# Patient Record
Sex: Male | Born: 1960 | Race: Black or African American | Hispanic: No | Marital: Single | Smoking: Former smoker
Health system: Southern US, Community
[De-identification: ages and names within clinical notes are randomized; demographics above are authoritative.]

## PROBLEM LIST (undated history)

## (undated) DIAGNOSIS — N289 Disorder of kidney and ureter, unspecified: Secondary | ICD-10-CM

## (undated) DIAGNOSIS — E7800 Pure hypercholesterolemia, unspecified: Secondary | ICD-10-CM

## (undated) DIAGNOSIS — I82409 Acute embolism and thrombosis of unspecified deep veins of unspecified lower extremity: Secondary | ICD-10-CM

## (undated) DIAGNOSIS — I509 Heart failure, unspecified: Secondary | ICD-10-CM

## (undated) DIAGNOSIS — I10 Essential (primary) hypertension: Secondary | ICD-10-CM

---

## 1998-07-05 ENCOUNTER — Ambulatory Visit (HOSPITAL_BASED_OUTPATIENT_CLINIC_OR_DEPARTMENT_OTHER): Admission: RE | Admit: 1998-07-05 | Discharge: 1998-07-05 | Payer: Self-pay | Admitting: *Deleted

## 2002-09-14 ENCOUNTER — Emergency Department (HOSPITAL_COMMUNITY): Admission: EM | Admit: 2002-09-14 | Discharge: 2002-09-14 | Payer: Self-pay | Admitting: Emergency Medicine

## 2002-10-27 ENCOUNTER — Ambulatory Visit (HOSPITAL_COMMUNITY): Admission: RE | Admit: 2002-10-27 | Discharge: 2002-10-27 | Payer: Self-pay | Admitting: Gastroenterology

## 2003-12-08 ENCOUNTER — Ambulatory Visit (HOSPITAL_COMMUNITY): Admission: RE | Admit: 2003-12-08 | Discharge: 2003-12-08 | Payer: Self-pay | Admitting: Internal Medicine

## 2005-04-07 ENCOUNTER — Emergency Department (HOSPITAL_COMMUNITY): Admission: EM | Admit: 2005-04-07 | Discharge: 2005-04-07 | Payer: Self-pay | Admitting: Emergency Medicine

## 2005-06-21 ENCOUNTER — Emergency Department (HOSPITAL_COMMUNITY): Admission: EM | Admit: 2005-06-21 | Discharge: 2005-06-21 | Payer: Self-pay | Admitting: Emergency Medicine

## 2005-09-04 ENCOUNTER — Ambulatory Visit: Payer: Self-pay | Admitting: Family Medicine

## 2005-09-09 ENCOUNTER — Ambulatory Visit: Payer: Self-pay | Admitting: Family Medicine

## 2005-09-24 ENCOUNTER — Ambulatory Visit: Payer: Self-pay | Admitting: Internal Medicine

## 2005-12-11 ENCOUNTER — Ambulatory Visit: Payer: Self-pay | Admitting: Family Medicine

## 2005-12-17 ENCOUNTER — Ambulatory Visit: Payer: Self-pay | Admitting: Family Medicine

## 2005-12-24 ENCOUNTER — Ambulatory Visit: Payer: Self-pay | Admitting: Family Medicine

## 2006-07-06 ENCOUNTER — Ambulatory Visit: Payer: Self-pay | Admitting: Family Medicine

## 2006-10-06 ENCOUNTER — Emergency Department (HOSPITAL_COMMUNITY): Admission: EM | Admit: 2006-10-06 | Discharge: 2006-10-06 | Payer: Self-pay | Admitting: Emergency Medicine

## 2006-10-16 ENCOUNTER — Ambulatory Visit: Payer: Self-pay | Admitting: Family Medicine

## 2006-11-15 ENCOUNTER — Emergency Department (HOSPITAL_COMMUNITY): Admission: EM | Admit: 2006-11-15 | Discharge: 2006-11-15 | Payer: Self-pay | Admitting: Emergency Medicine

## 2006-11-16 ENCOUNTER — Emergency Department (HOSPITAL_COMMUNITY): Admission: EM | Admit: 2006-11-16 | Discharge: 2006-11-16 | Payer: Self-pay | Admitting: Emergency Medicine

## 2007-01-31 ENCOUNTER — Emergency Department (HOSPITAL_COMMUNITY): Admission: EM | Admit: 2007-01-31 | Discharge: 2007-01-31 | Payer: Self-pay | Admitting: Emergency Medicine

## 2007-02-17 ENCOUNTER — Ambulatory Visit: Payer: Self-pay | Admitting: Family Medicine

## 2007-08-26 ENCOUNTER — Ambulatory Visit: Payer: Self-pay | Admitting: Internal Medicine

## 2007-08-26 LAB — CONVERTED CEMR LAB
ALT: 19 units/L (ref 0–53)
CO2: 25 meq/L (ref 19–32)
Calcium: 7.9 mg/dL — ABNORMAL LOW (ref 8.4–10.5)
Chloride: 107 meq/L (ref 96–112)
Creatinine, Ser: 2 mg/dL — ABNORMAL HIGH (ref 0.40–1.50)
Glucose, Bld: 92 mg/dL (ref 70–99)

## 2007-09-09 ENCOUNTER — Ambulatory Visit: Payer: Self-pay | Admitting: Internal Medicine

## 2007-09-09 LAB — CONVERTED CEMR LAB
CO2: 24 meq/L (ref 19–32)
Calcium: 7.5 mg/dL — ABNORMAL LOW (ref 8.4–10.5)
Potassium: 4.6 meq/L (ref 3.5–5.3)
Sodium: 139 meq/L (ref 135–145)

## 2007-10-04 ENCOUNTER — Emergency Department (HOSPITAL_COMMUNITY): Admission: EM | Admit: 2007-10-04 | Discharge: 2007-10-04 | Payer: Self-pay | Admitting: Emergency Medicine

## 2007-10-08 ENCOUNTER — Ambulatory Visit: Payer: Self-pay | Admitting: Internal Medicine

## 2007-10-08 ENCOUNTER — Encounter (INDEPENDENT_AMBULATORY_CARE_PROVIDER_SITE_OTHER): Payer: Self-pay | Admitting: Family Medicine

## 2007-10-08 LAB — CONVERTED CEMR LAB
ALT: 20 units/L (ref 0–53)
CO2: 28 meq/L (ref 19–32)
Calcium: 8 mg/dL — ABNORMAL LOW (ref 8.4–10.5)
Chloride: 107 meq/L (ref 96–112)
Cholesterol: 193 mg/dL (ref 0–200)
Creatinine, Ser: 1.39 mg/dL (ref 0.40–1.50)
Sodium: 143 meq/L (ref 135–145)
Total Protein: 4.7 g/dL — ABNORMAL LOW (ref 6.0–8.3)

## 2008-01-01 ENCOUNTER — Emergency Department (HOSPITAL_COMMUNITY): Admission: EM | Admit: 2008-01-01 | Discharge: 2008-01-01 | Payer: Self-pay | Admitting: Emergency Medicine

## 2008-01-20 ENCOUNTER — Emergency Department (HOSPITAL_COMMUNITY): Admission: EM | Admit: 2008-01-20 | Discharge: 2008-01-20 | Payer: Self-pay | Admitting: Emergency Medicine

## 2008-02-11 ENCOUNTER — Encounter (INDEPENDENT_AMBULATORY_CARE_PROVIDER_SITE_OTHER): Payer: Self-pay | Admitting: Family Medicine

## 2008-02-11 ENCOUNTER — Ambulatory Visit: Payer: Self-pay | Admitting: Internal Medicine

## 2008-02-11 LAB — CONVERTED CEMR LAB
BUN: 26 mg/dL — ABNORMAL HIGH (ref 6–23)
Basophils Relative: 0 % (ref 0–1)
Cortisol, Plasma: 15.7 ug/dL
Creatinine, Ser: 1.19 mg/dL (ref 0.40–1.50)
Eosinophils Absolute: 0.5 10*3/uL (ref 0.0–0.7)
Hemoglobin: 11.7 g/dL — ABNORMAL LOW (ref 13.0–17.0)
MCHC: 31.1 g/dL (ref 30.0–36.0)
MCV: 68.7 fL — ABNORMAL LOW (ref 78.0–100.0)
Monocytes Absolute: 1 10*3/uL (ref 0.1–1.0)
Monocytes Relative: 14 % — ABNORMAL HIGH (ref 3–12)
Pro B Natriuretic peptide (BNP): 60 pg/mL (ref 0.0–100.0)
RBC: 5.47 M/uL (ref 4.22–5.81)

## 2008-02-18 ENCOUNTER — Ambulatory Visit: Payer: Self-pay | Admitting: Internal Medicine

## 2008-02-18 ENCOUNTER — Encounter (INDEPENDENT_AMBULATORY_CARE_PROVIDER_SITE_OTHER): Payer: Self-pay | Admitting: Family Medicine

## 2008-02-18 LAB — CONVERTED CEMR LAB
CO2: 27 meq/L (ref 19–32)
Chloride: 103 meq/L (ref 96–112)
Creatinine, Ser: 1.61 mg/dL — ABNORMAL HIGH (ref 0.40–1.50)
Saturation Ratios: 16 % — ABNORMAL LOW (ref 20–55)

## 2008-02-24 ENCOUNTER — Emergency Department (HOSPITAL_COMMUNITY): Admission: EM | Admit: 2008-02-24 | Discharge: 2008-02-24 | Payer: Self-pay | Admitting: Emergency Medicine

## 2008-03-20 ENCOUNTER — Ambulatory Visit: Payer: Self-pay | Admitting: Family Medicine

## 2008-03-20 ENCOUNTER — Encounter (INDEPENDENT_AMBULATORY_CARE_PROVIDER_SITE_OTHER): Payer: Self-pay | Admitting: Family Medicine

## 2008-03-20 LAB — CONVERTED CEMR LAB
Albumin: 2.7 g/dL — ABNORMAL LOW (ref 3.5–5.2)
CO2: 27 meq/L (ref 19–32)
Calcium: 8.3 mg/dL — ABNORMAL LOW (ref 8.4–10.5)
Chloride: 98 meq/L (ref 96–112)
Glucose, Bld: 90 mg/dL (ref 70–99)
Sodium: 138 meq/L (ref 135–145)
Total Bilirubin: 0.3 mg/dL (ref 0.3–1.2)
Total Protein: 4.8 g/dL — ABNORMAL LOW (ref 6.0–8.3)

## 2008-03-21 ENCOUNTER — Encounter: Payer: Self-pay | Admitting: Family Medicine

## 2008-03-21 ENCOUNTER — Ambulatory Visit (HOSPITAL_COMMUNITY): Admission: RE | Admit: 2008-03-21 | Discharge: 2008-03-21 | Payer: Self-pay | Admitting: Family Medicine

## 2008-03-21 ENCOUNTER — Ambulatory Visit: Payer: Self-pay | Admitting: Vascular Surgery

## 2008-03-24 ENCOUNTER — Ambulatory Visit: Payer: Self-pay | Admitting: *Deleted

## 2008-03-27 ENCOUNTER — Emergency Department (HOSPITAL_COMMUNITY): Admission: EM | Admit: 2008-03-27 | Discharge: 2008-03-28 | Payer: Self-pay | Admitting: Emergency Medicine

## 2008-03-29 ENCOUNTER — Encounter: Payer: Self-pay | Admitting: Family Medicine

## 2008-03-30 ENCOUNTER — Ambulatory Visit: Payer: Self-pay | Admitting: Infectious Diseases

## 2008-03-30 ENCOUNTER — Ambulatory Visit: Payer: Self-pay | Admitting: Internal Medicine

## 2008-03-30 ENCOUNTER — Inpatient Hospital Stay (HOSPITAL_COMMUNITY): Admission: AD | Admit: 2008-03-30 | Discharge: 2008-04-06 | Payer: Self-pay | Admitting: Infectious Diseases

## 2008-03-31 ENCOUNTER — Encounter: Payer: Self-pay | Admitting: Infectious Diseases

## 2008-04-03 ENCOUNTER — Encounter: Payer: Self-pay | Admitting: Infectious Diseases

## 2008-04-10 ENCOUNTER — Telehealth: Payer: Self-pay | Admitting: Infectious Diseases

## 2008-04-11 ENCOUNTER — Encounter: Payer: Self-pay | Admitting: Infectious Diseases

## 2008-04-13 ENCOUNTER — Telehealth: Payer: Self-pay | Admitting: Infectious Diseases

## 2008-04-19 ENCOUNTER — Encounter (INDEPENDENT_AMBULATORY_CARE_PROVIDER_SITE_OTHER): Payer: Self-pay | Admitting: Family Medicine

## 2008-04-19 ENCOUNTER — Ambulatory Visit: Payer: Self-pay | Admitting: Internal Medicine

## 2008-04-19 LAB — CONVERTED CEMR LAB
Albumin: 2.5 g/dL — ABNORMAL LOW (ref 3.5–5.2)
BUN: 16 mg/dL (ref 6–23)
CO2: 24 meq/L (ref 19–32)
Calcium: 7.6 mg/dL — ABNORMAL LOW (ref 8.4–10.5)
Chloride: 107 meq/L (ref 96–112)
Glucose, Bld: 80 mg/dL (ref 70–99)
Lymphocytes Relative: 7 % — ABNORMAL LOW (ref 12–46)
Lymphs Abs: 0.5 10*3/uL — ABNORMAL LOW (ref 0.7–4.0)
MCV: 71.3 fL — ABNORMAL LOW (ref 78.0–100.0)
Monocytes Relative: 13 % — ABNORMAL HIGH (ref 3–12)
Neutro Abs: 5.5 10*3/uL (ref 1.7–7.7)
Neutrophils Relative %: 74 % (ref 43–77)
Potassium: 4.1 meq/L (ref 3.5–5.3)
RBC: 5.4 M/uL (ref 4.22–5.81)
Sodium: 140 meq/L (ref 135–145)
Total Protein: 4.6 g/dL — ABNORMAL LOW (ref 6.0–8.3)
WBC: 7.4 10*3/uL (ref 4.0–10.5)

## 2008-04-27 ENCOUNTER — Emergency Department (HOSPITAL_COMMUNITY): Admission: EM | Admit: 2008-04-27 | Discharge: 2008-04-28 | Payer: Self-pay | Admitting: Emergency Medicine

## 2008-04-28 ENCOUNTER — Ambulatory Visit: Payer: Self-pay | Admitting: Internal Medicine

## 2008-05-03 ENCOUNTER — Encounter: Payer: Self-pay | Admitting: Infectious Diseases

## 2008-05-14 ENCOUNTER — Encounter (INDEPENDENT_AMBULATORY_CARE_PROVIDER_SITE_OTHER): Payer: Self-pay | Admitting: Cardiovascular Disease

## 2008-05-16 ENCOUNTER — Encounter: Payer: Self-pay | Admitting: Infectious Diseases

## 2008-05-30 ENCOUNTER — Ambulatory Visit: Payer: Self-pay | Admitting: Internal Medicine

## 2008-05-30 LAB — CONVERTED CEMR LAB
ALT: <8 units/L (ref 0–53)
AST: 16 units/L (ref 0–37)
Albumin: 2.6 g/dL — ABNORMAL LOW (ref 3.5–5.2)
BUN: 48 mg/dL — ABNORMAL HIGH (ref 6–23)
Basophils Absolute: 0 10*3/uL (ref 0.0–0.1)
CO2: 25 meq/L (ref 19–32)
Calcium: 7.9 mg/dL — ABNORMAL LOW (ref 8.4–10.5)
Chloride: 101 meq/L (ref 96–112)
Creatinine, Ser: 1.76 mg/dL — ABNORMAL HIGH (ref 0.40–1.50)
Hemoglobin: 10.7 g/dL — ABNORMAL LOW (ref 13.0–17.0)
Lymphocytes Relative: 9 % — ABNORMAL LOW (ref 12–46)
Monocytes Absolute: 0.6 10*3/uL (ref 0.1–1.0)
Monocytes Relative: 10 % (ref 3–12)
Neutro Abs: 4.6 10*3/uL (ref 1.7–7.7)
Neutrophils Relative %: 73 % (ref 43–77)
Potassium: 4.7 meq/L (ref 3.5–5.3)
RBC: 4.95 M/uL (ref 4.22–5.81)

## 2008-06-05 ENCOUNTER — Encounter (INDEPENDENT_AMBULATORY_CARE_PROVIDER_SITE_OTHER): Payer: Self-pay | Admitting: Internal Medicine

## 2008-06-05 LAB — CONVERTED CEMR LAB
Iron: 22 ug/dL — ABNORMAL LOW (ref 42–165)
Saturation Ratios: 8 % — ABNORMAL LOW (ref 20–55)

## 2008-06-07 ENCOUNTER — Ambulatory Visit (HOSPITAL_COMMUNITY): Admission: RE | Admit: 2008-06-07 | Discharge: 2008-06-07 | Payer: Self-pay | Admitting: Internal Medicine

## 2008-07-12 ENCOUNTER — Encounter: Payer: Self-pay | Admitting: Infectious Diseases

## 2008-08-03 ENCOUNTER — Ambulatory Visit: Payer: Self-pay | Admitting: Internal Medicine

## 2008-09-21 ENCOUNTER — Ambulatory Visit: Payer: Self-pay | Admitting: Internal Medicine

## 2009-08-02 ENCOUNTER — Ambulatory Visit: Payer: Self-pay | Admitting: Internal Medicine

## 2009-08-02 LAB — CONVERTED CEMR LAB
Alkaline Phosphatase: 78 units/L (ref 39–117)
BUN: 22 mg/dL (ref 6–23)
Basophils Absolute: 0 10*3/uL (ref 0.0–0.1)
Basophils Relative: 0 % (ref 0–1)
CO2: 24 meq/L (ref 19–32)
Cholesterol: 151 mg/dL (ref 0–200)
Creatinine, Ser: 1.5 mg/dL (ref 0.40–1.50)
Eosinophils Absolute: 0.2 10*3/uL (ref 0.0–0.7)
Eosinophils Relative: 4 % (ref 0–5)
Glucose, Bld: 86 mg/dL (ref 70–99)
HCT: 43 % (ref 39.0–52.0)
HDL: 47 mg/dL (ref >39)
Hemoglobin: 13.1 g/dL (ref 13.0–17.0)
LDL Cholesterol: 92 mg/dL (ref 0–99)
MCHC: 30.5 g/dL (ref 30.0–36.0)
MCV: 67.7 fL — ABNORMAL LOW (ref 78.0–100.0)
Monocytes Absolute: 0.7 10*3/uL (ref 0.1–1.0)
Monocytes Relative: 12 % (ref 3–12)
Neutro Abs: 4.4 10*3/uL (ref 1.7–7.7)
RBC: 6.35 M/uL — ABNORMAL HIGH (ref 4.22–5.81)
RDW: 19.9 % — ABNORMAL HIGH (ref 11.5–15.5)
Sodium: 141 meq/L (ref 135–145)
Total Bilirubin: 0.6 mg/dL (ref 0.3–1.2)
Total Protein: 5.1 g/dL — ABNORMAL LOW (ref 6.0–8.3)
Triglycerides: 60 mg/dL (ref <150)
VLDL: 12 mg/dL (ref 0–40)

## 2009-08-27 ENCOUNTER — Ambulatory Visit: Payer: Self-pay | Admitting: Internal Medicine

## 2009-08-27 LAB — CONVERTED CEMR LAB
CO2: 25 meq/L (ref 19–32)
Chloride: 105 meq/L (ref 96–112)
Creatinine, Ser: 1.6 mg/dL — ABNORMAL HIGH (ref 0.40–1.50)
Glucose, Bld: 101 mg/dL — ABNORMAL HIGH (ref 70–99)
PSA: 1.19 ng/mL (ref 0.10–4.00)
PTH: 146.9 pg/mL — ABNORMAL HIGH (ref 14.0–72.0)
Vit D, 25-Hydroxy: 8 ng/mL — ABNORMAL LOW (ref 30–89)

## 2009-09-06 ENCOUNTER — Ambulatory Visit: Payer: Self-pay | Admitting: Internal Medicine

## 2009-09-06 LAB — CONVERTED CEMR LAB
Chloride: 106 meq/L (ref 96–112)
Glucose, Bld: 76 mg/dL (ref 70–99)
PSA: 1.32 ng/mL (ref 0.10–4.00)
Potassium: 4.3 meq/L (ref 3.5–5.3)
Sodium: 142 meq/L (ref 135–145)
Testosterone: 361.03 ng/dL (ref 350–890)

## 2009-09-11 ENCOUNTER — Encounter (INDEPENDENT_AMBULATORY_CARE_PROVIDER_SITE_OTHER): Payer: Self-pay | Admitting: Internal Medicine

## 2009-09-11 LAB — CONVERTED CEMR LAB
Creatinine 24 HR UR: 1830 mg/24hr (ref 800–2000)
Creatinine Clearance: 80 mL/min (ref 75–125)
Creatinine, Urine: 122 mg/dL

## 2010-02-21 ENCOUNTER — Ambulatory Visit: Payer: Self-pay | Admitting: Internal Medicine

## 2010-02-21 LAB — CONVERTED CEMR LAB
Alkaline Phosphatase: 60 units/L (ref 39–117)
BUN: 34 mg/dL — ABNORMAL HIGH (ref 6–23)
Creatinine, Ser: 1.58 mg/dL — ABNORMAL HIGH (ref 0.40–1.50)
Glucose, Bld: 81 mg/dL (ref 70–99)
HDL: 51 mg/dL (ref >39)
LDL Cholesterol: 86 mg/dL (ref 0–99)
Total Bilirubin: 0.5 mg/dL (ref 0.3–1.2)
Total CHOL/HDL Ratio: 2.9
Triglycerides: 44 mg/dL (ref <150)
VLDL: 9 mg/dL (ref 0–40)

## 2010-06-03 ENCOUNTER — Ambulatory Visit: Payer: Self-pay | Admitting: Internal Medicine

## 2010-06-03 LAB — CONVERTED CEMR LAB
BUN: 27 mg/dL — ABNORMAL HIGH (ref 6–23)
Calcium: 8.3 mg/dL — ABNORMAL LOW (ref 8.4–10.5)
Chloride: 103 meq/L (ref 96–112)
Creatinine, Ser: 1.65 mg/dL — ABNORMAL HIGH (ref 0.40–1.50)
PSA: 1.38 ng/mL (ref 0.10–4.00)
Testosterone: 414.45 ng/dL (ref 350–890)

## 2010-09-03 ENCOUNTER — Ambulatory Visit: Payer: Self-pay | Admitting: Internal Medicine

## 2010-11-26 ENCOUNTER — Ambulatory Visit: Payer: Self-pay | Admitting: Internal Medicine

## 2010-11-26 ENCOUNTER — Encounter (INDEPENDENT_AMBULATORY_CARE_PROVIDER_SITE_OTHER): Payer: Self-pay | Admitting: Family Medicine

## 2010-11-26 LAB — CONVERTED CEMR LAB
Basophils Absolute: 0 10*3/uL (ref 0.0–0.1)
CO2: 26 meq/L (ref 19–32)
Calcium: 9.1 mg/dL (ref 8.4–10.5)
Eosinophils Relative: 5 % (ref 0–5)
HCT: 43.2 % (ref 39.0–52.0)
Lymphocytes Relative: 13 % (ref 12–46)
Microalb, Ur: 0.5 mg/dL (ref 0.00–1.89)
Platelets: 135 10*3/uL — ABNORMAL LOW (ref 150–400)
RDW: 18.2 % — ABNORMAL HIGH (ref 11.5–15.5)
Sodium: 141 meq/L (ref 135–145)

## 2011-01-04 ENCOUNTER — Emergency Department (HOSPITAL_COMMUNITY)
Admission: EM | Admit: 2011-01-04 | Discharge: 2011-01-04 | Payer: Self-pay | Source: Home / Self Care | Admitting: Emergency Medicine

## 2011-05-13 NOTE — Discharge Summary (Signed)
NAME:  Justin [name], Justin [name] NO.:  [id number]   MEDICAL RECORD NO.:  [id number]          PATIENT TYPE:  INP   LOCATION:  5507                         FACILITY:  [facility]   PHYSICIAN:  Fransisco Hertz, M.D.  DATE OF BIRTH:  10-17-1961   DATE OF ADMISSION:  03/30/2008  DATE OF DISCHARGE:  04/06/2008                               DISCHARGE SUMMARY   DISCHARGE DIAGNOSES:  1. Staphylococcus aureus coag negative bacteremia.  2. 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  3. Anasarca of unknown cause.  4. Acute on chronic renal failure, baseline creatinine around 1.35.  5. Lower back pain radiating to right more than left leg.  6. Transient thrombocytopenia resolved.  7. History of left knee surgery in 1999  8. History of anemia.  9. Vitamin B12 deficiency.   ALLERGIES:  No known drug allergies.   MEDICATION ON DISCHARGE:  1. Cefazolin 1 gram IV every day 8 hours for 6 weeks total (until May 17, 2008).  This will be provided by [facility].  2. Prozac 40 mg p.o. daily.  3. Aspirin 81 mg p.o. daily.  4. Crestor 20 mg p.o. daily.  5. Albuterol inhaler 1-2 puffs every 4-6 h. p.r.n.  6. Vitamin B12 1000 micrograms intramuscularly daily for 7 days and      then weekly for 1 month.  7. Multivitamin 1 tablet daily.  8. Percocet 5/325 1-2 tablets q.4-6 h. p.r.n. pain.  9. Benadryl 25 mg p.o. q.6 h. p.r.n. itching  10.TED hoses bilateral.   The patient was instructed to stop taking Lasix and potassium unless  told so by his primary care physician.  He was also instructed to follow  a low sodium, heart healthy diet and to use a walker.   As mentioned before,  he has an appointment at [facility] on April 22,  at 4:00 p.m.   CONDITION ON DISCHARGE:  Improved.   PROCEDURES:  The patient had an MRI at an outside facility on March 29, 2008, showing  1. Findings most compatible with acute diskitis, osteomyelitis at L3-      L4 including  paraspinal muscle involvement and changes suspicious      for associated bilateral facet septic arthritis.  No epidural or      psoas abscess identified at this time.  2. Severe spinal stenosis at L3-L4 probably preexisting, but      aggravated by the acute changes described in findings most      compatible with acute diskitis, osteomyelitis at L3-L4 including      paraspinal muscle involvement and changes suspicious for associated      bilateral facet septic arthritis.  Moderate-to-severe spinal      stenosis is also seen at L2-L3 and L4-L5 levels, and appears      degenerative in nature.   He had a chest x-ray on March 30, 2008, showing no active cardiopulmonary  disease.   He had a CT of the abdomen and pelvis on April 03, 2008, showing:  1. 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  2. Anasarca.  3. Pericardial effusion.  4. Bilateral pleural effusions right greater than left with associated      positive atelectasis in the lower lobe.  5. Diskitis, osteomyelitis L3-L4.   The CT of the pelvis shows:  1. Dilated iliofemoral veins without filling defects.  2. Anasarca.  3. Proctocolitis  4. Ascites.   He also had a CT angio of the chest showing  1. no evidence of pulmonary embolism.  2. Findings suggestive of volume overload including small pericardial      effusion, small-to-moderate bilateral layering pleural effusions, a      degree of anasarca and venous engorgement, diffusely.  Otherwise,      no acute pulmonary abnormality.   PROCEDURES:  The patient had a PICC line placed on April 2009.  He also  had a transesophageal echocardiography that excluded valvular  vegetations, showed an ejection fraction of 60% and no left ventricular  regional wall motion abnormalities.  There was no pericardial effusion.  The left atrial appendage function was mildly reduced but no thrombus  was found.  He had a 2D echo  obtained on March 31, 2008, showing mainly  the same findings with an ejection fraction of 55-65%, however, it also  showed a small pericardial effusion.   HISTORY OF PRESENT ILLNESS:  The patient is a 50 year old African  American man with history of hypertension complaining of 2-3 weeks  history of low back pain and right leg swelling.  He is a patient of Dr.  Reche Dixon at [facility].  Denies any past history of back pain.  The  onset was subacute at first with a dull aching pain 2-3 weeks ago, but  worsened acutely approximately 2 days prior to admission.  He went to  [facility] originally on February 18, 2008.  He was given diuretics for  his swollen legs but he did not get relief for his symptoms.  He had an  ED visit for back pain.  He returned to his primary care physician who  decided to get an MRI of C-spine on March 29, 2008, and was found to have  changes consistent with osteomyelitis and diskitis of L3-L4.  The  patient was admitted directly per Dr. Benjaman Lobe preference.  He denies  fever, chills, admits for trauma (2-3 months ago, he fell on his right  buttocks).  He admits for pain in his back radiating to the right more  than left leg plus right foot tingling without numbness.  He denies  incontinence of bowel and bladder.  No saddle anesthesia and no other  symptoms.   PHYSICAL EXAMINATION:  VITAL SIGNS:  Temperature 98.2, blood pressure  107/71, pulse 87, respiratory rate 18, oxygen saturation 96% on room  air.  GENERAL:  He was a pleasant gentleman in no acute distress, alert  and oriented x3.  HEENT:  EYES, PERRLA.  Extraocular movements intact.  Arcus senilis.  ENT, poor dentition (he has 4 frontal teeth without caries or  abscesses).  No tympanic membrane opacity.  NECK:  Supple.  No lymphadenopathy.  RESPIRATORY:  Distant breath sounds bilaterally but clear to  auscultation.  CARDIOVASCULAR:  Regular rate and rhythm.  No murmurs, rubs, gallops.  GI:  Soft,  nontender, nondistended.  Positive bowel sounds.  EXTREMITIES:  Edema 3+ of the right lower extremity and edema 2+ of the  left lower extremity  GU:  Normal external genitalia, undescended testis, plus right CVA  tenderness.  SKIN:  No rashes.  No IV drug abuse signs.  MENTAL STATUS:  Alert and oriented x3  NEURO:  Strength 5/5 throughout.  Sensation intact grossly.  Babinski  negative bilaterally.  Reflexes were difficult to elicit.  He has right  lumbar and hip pain with grossly normal movement of hips bilaterally.  PSYCH:  Appropriate, cooperative.   LABS ON ADMISSION:  CBC, actually white blood count 10.0, hemoglobin  11.7, hematocrit 36.9, platelet count 236,000.  Coags were normal.  Sodium 136, potassium 4.0, chloride 100, CO2 of 28, glucose 90, BUN 27,  creatinine 1.52.  GFR 59, bilirubin 0.4, alk phos 71, AST 22, ALT 14,  total protein 4.3, albumin 1.9, calcium 8.0.  Cardiac panel was  negative.  Urinalysis was normal.  FENa was 0.3, ESR was 12 and 19 on  repeat, CRP was 0.9.  Anemia panel showed reticulocytes of 1.0%, iron  21, TIBC 237%, saturation 9, vitamin B12 of 222, ferritin 116.  HIV test  was nonreactive.  ANA was negative.  TSH 2.94.  GC and Chlamydia in  urine were negative.  Urine culture was negative.  The 24-hour protein  showed minimally elevated protein level of 153, prealbumin was 17.5.  Wound culture obtained from the L3-L4 aspirates showed coag-negative  staph.  As mentioned above, 2 lab cultures showed staph species coag-  negative with pansensitive (except for penicillin).  Methylmalonic acid  was normal at 153, protein electrophoresis showed a possibility of faint  restricted band cannot be completely excluded in the gamma region.  BNP  was 64.0.  An EKG was nonsignificant showing only a prolonged QT.   ASSESSMENT AND PLAN:  This is a 50 year old African American man with  past medical history of hypertension and left knee surgery in 1999  without history  of back surgery, diabetes, or other interventions  presenting with a low back pain and lower extremity swelling.  1. Low back pain.  This is caused by osteomyelitis/diskitis of his L3-      L4 intervertebral space.  We performed an aspiration biopsy of this      site and it confirmed the diagnosis of osteomyelitis, growing      Staphylococcus aureus, coag negative.  We started the patient on      vancomycin and Zosyn initially and then switched him to cefazolin      since staph was sensitive to this antibiotic.  He will be      discharged with PICC line and will have 6 weeks of cefazolin with      the help of [facility].  Of note, his ESR was initially      normal and then minimally elevated.  ANA was negative.  He had no      sweats, chills or increased pain (on Percocet).  A PPD was      negative.  2. Staphylococcus bacteremia.  We had no source for this problem.  He      had no abscesses in his mouth.  He did not use any IV drugs.  He      was not a diabetic.  He did not have any instrumentation of his      back or another part of his body.  His only history is from 10      years ago of left knee surgery.  Therefore, we asked for cardiology      consult with Dr. Deborah Chalk who performed TEE which turned out to be  negative.  3. Lower extremity swelling with anasarca.  The differential was      broad.  We were able to rule out acute CHF with a normal BNP and a      normal 2D echo.  He had no shortness of breath or other signs of      distress.  Another consideration would be liver dysfunction.      However, his liver function tests were normal except the albumin      which was low.  We believe that low albumin was actually related to      consumption from his chronic infection.  His PT/PTT and INR were      also normal.  We consider protein loss due to chronic kidney      disease; however, this was very mild and he did not have major loss      of protein in his urine.  His  baseline creatinine was between 1.2-      1.4 and he increased to a maximum of 1.6, however, this decreased      to his baseline prior to discharge.  We checked lower extremity      Dopplers to rule out DVT as the cause for his edema, however, this      was negative.  We also consider compression in his pelvis, abdomen      or chest, however, CTs obtained of these sites were nonsignificant.  4. Unprotected sexual contact.  GC and Chlamydia were negative and an      HIV was negative.  5. B12 deficiency.  The B12 was low and methylmalonic acid was normal,      however, we decided to start him on B12 intramuscular injections.  6. Thrombocytopenia.  This was transient and recovered by the time of      discharge.   VITAL SIGNS AT DISCHARGE:  Temperature 98.2, blood pressure 114/76,  pulse 81, respiratory 16, oxygen saturation 100% on room air.   White blood cells 8.1, hemoglobin 11.3, platelets 207,000.  Sodium 138,  potassium 4.0, chloride 105, bicarb 27, BUN 11, creatinine 1.25, glucose  100, albumin 1.8, calcium 8.2.      Carlus Pavlov, M.D.  Electronically Signed      Fransisco Hertz, M.D.  Electronically Signed    CG/MEDQ  D:  04/06/2008  T:  04/07/2008  Job:  [id number]   cc:   Melvern Banker

## 2011-05-16 NOTE — Op Note (Signed)
   NAME:  Justin [name], Justin [name]                       ACCOUNT NO.:  [id number]   MEDICAL RECORD NO.:  [id number]                   PATIENT TYPE:  AMB   LOCATION:  ENDO                                 FACILITY:  [facility]   PHYSICIAN:  James L. Malon Kindle., M.D.          DATE OF BIRTH:  1961/01/24   DATE OF PROCEDURE:  10/27/2002  DATE OF DISCHARGE:                                 OPERATIVE REPORT   PROCEDURE PERFORMED:  Colonoscopy.   ENDOSCOPIST:  Llana Aliment. Edwards, M.D.   MEDICATIONS:  Fentanyl 70 mcg, Versed 7 mg IV.   INSTRUMENT USED:  Pediatric Olympus video colonoscope.   INDICATIONS FOR PROCEDURE:  Heme positive stools.   DESCRIPTION OF PROCEDURE:  The procedure had been explained to the patient  and consent obtained.  With the patient in the left lateral decubitus  position, the pediatric adjustable Olympus video colonoscope was inserted  and advanced under direct visualization.  The prep was excellent and we were  able to reach the cecum without difficulty.  The ileocecal valve and  appendiceal orifice were seen.  The scope was withdrawn and the cecum,  ascending colon, hepatic flexure, transverse colon, splenic flexure,  descending and sigmoid colon were seen well.  No polyps seen.  No  significant diverticular disease.  Internal hemorrhoids seen in the rectum.  The scope was withdrawn.  The patient tolerated the procedure well.   ASSESSMENT:  Heme positive stools possibly secondary to internal  hemorrhoids.   PLAN:  Will give hemorrhoid instruction sheet and will see back in the  office in three months to recheck his stools.                                               James L. Malon Kindle., M.D.    Waldron Session  D:  10/27/2002  T:  10/27/2002  Job:  [id number]   cc:   Junius Creamer, M.D.

## 2011-06-11 ENCOUNTER — Ambulatory Visit (HOSPITAL_COMMUNITY)
Admission: RE | Admit: 2011-06-11 | Discharge: 2011-06-11 | Disposition: A | Discharge status: Home / Self Care | Payer: Medicare Other | Source: Ambulatory Visit | Attending: Family Medicine | Admitting: Family Medicine

## 2011-06-11 ENCOUNTER — Other Ambulatory Visit (HOSPITAL_COMMUNITY): Payer: Self-pay | Admitting: Family Medicine

## 2011-06-11 DIAGNOSIS — M25569 Pain in unspecified knee: Secondary | ICD-10-CM

## 2011-06-11 DIAGNOSIS — M1710 Unilateral primary osteoarthritis, unspecified knee: Secondary | ICD-10-CM | POA: Insufficient documentation

## 2011-06-29 ENCOUNTER — Emergency Department (HOSPITAL_COMMUNITY)
Admission: EM | Admit: 2011-06-29 | Discharge: 2011-06-29 | Disposition: A | Discharge status: Home / Self Care | Payer: Medicare Other | Attending: Emergency Medicine | Admitting: Emergency Medicine

## 2011-06-29 DIAGNOSIS — M7989 Other specified soft tissue disorders: Secondary | ICD-10-CM

## 2011-06-29 DIAGNOSIS — R609 Edema, unspecified: Secondary | ICD-10-CM | POA: Insufficient documentation

## 2011-06-29 DIAGNOSIS — E7800 Pure hypercholesterolemia, unspecified: Secondary | ICD-10-CM | POA: Insufficient documentation

## 2011-06-29 DIAGNOSIS — I10 Essential (primary) hypertension: Secondary | ICD-10-CM | POA: Insufficient documentation

## 2011-06-29 DIAGNOSIS — B353 Tinea pedis: Secondary | ICD-10-CM | POA: Insufficient documentation

## 2011-06-29 DIAGNOSIS — I509 Heart failure, unspecified: Secondary | ICD-10-CM | POA: Insufficient documentation

## 2011-06-29 LAB — DIFFERENTIAL
Basophils Absolute: 0 10*3/uL (ref 0.0–0.1)
Lymphs Abs: 0.5 10*3/uL — ABNORMAL LOW (ref 0.7–4.0)
Monocytes Absolute: 0.6 10*3/uL (ref 0.1–1.0)
Monocytes Relative: 13 % — ABNORMAL HIGH (ref 3–12)
Neutro Abs: 3.5 10*3/uL (ref 1.7–7.7)

## 2011-06-29 LAB — URINALYSIS, ROUTINE W REFLEX MICROSCOPIC
Glucose, UA: NEGATIVE mg/dL
Hgb urine dipstick: NEGATIVE
Ketones, ur: NEGATIVE mg/dL
Leukocytes, UA: NEGATIVE
Protein, ur: NEGATIVE mg/dL
Urobilinogen, UA: 0.2 mg/dL (ref 0.0–1.0)

## 2011-06-29 LAB — COMPREHENSIVE METABOLIC PANEL
CO2: 29 mEq/L (ref 19–32)
Calcium: 8.9 mg/dL (ref 8.4–10.5)
Creatinine, Ser: 1.76 mg/dL — ABNORMAL HIGH (ref 0.50–1.35)
GFR calc Af Amer: 49 mL/min — ABNORMAL LOW (ref >60)
GFR calc non Af Amer: 41 mL/min — ABNORMAL LOW (ref >60)
Glucose, Bld: 119 mg/dL — ABNORMAL HIGH (ref 70–99)
Total Protein: 5.7 g/dL — ABNORMAL LOW (ref 6.0–8.3)

## 2011-06-29 LAB — CBC
Hemoglobin: 12.5 g/dL — ABNORMAL LOW (ref 13.0–17.0)
MCH: 21.7 pg — ABNORMAL LOW (ref 26.0–34.0)
MCHC: 31.3 g/dL (ref 30.0–36.0)
RDW: 15.9 % — ABNORMAL HIGH (ref 11.5–15.5)

## 2011-07-09 ENCOUNTER — Inpatient Hospital Stay (INDEPENDENT_AMBULATORY_CARE_PROVIDER_SITE_OTHER)
Admission: RE | Admit: 2011-07-09 | Discharge: 2011-07-09 | Disposition: A | Discharge status: Home / Self Care | Payer: Medicare Other | Source: Ambulatory Visit | Attending: Emergency Medicine | Admitting: Emergency Medicine

## 2011-07-09 DIAGNOSIS — R609 Edema, unspecified: Secondary | ICD-10-CM

## 2011-07-09 DIAGNOSIS — I959 Hypotension, unspecified: Secondary | ICD-10-CM

## 2011-07-09 LAB — POCT I-STAT, CHEM 8
HCT: 44 % (ref 39.0–52.0)
Hemoglobin: 15 g/dL (ref 13.0–17.0)
Potassium: 3.7 mEq/L (ref 3.5–5.1)
Sodium: 139 mEq/L (ref 135–145)

## 2011-07-18 ENCOUNTER — Ambulatory Visit (HOSPITAL_COMMUNITY)
Admission: RE | Admit: 2011-07-18 | Discharge: 2011-07-18 | Disposition: A | Discharge status: Home / Self Care | Payer: Medicare Other | Source: Ambulatory Visit | Attending: Family Medicine | Admitting: Family Medicine

## 2011-07-18 ENCOUNTER — Other Ambulatory Visit (HOSPITAL_COMMUNITY): Payer: Self-pay | Admitting: Family Medicine

## 2011-07-18 DIAGNOSIS — M5137 Other intervertebral disc degeneration, lumbosacral region: Secondary | ICD-10-CM | POA: Insufficient documentation

## 2011-07-18 DIAGNOSIS — M51379 Other intervertebral disc degeneration, lumbosacral region without mention of lumbar back pain or lower extremity pain: Secondary | ICD-10-CM | POA: Insufficient documentation

## 2011-07-18 DIAGNOSIS — R609 Edema, unspecified: Secondary | ICD-10-CM

## 2011-07-18 DIAGNOSIS — M7989 Other specified soft tissue disorders: Secondary | ICD-10-CM | POA: Insufficient documentation

## 2011-07-18 DIAGNOSIS — M545 Low back pain, unspecified: Secondary | ICD-10-CM | POA: Insufficient documentation

## 2011-09-19 LAB — CBC
MCHC: 31.9
RBC: 5.83 — ABNORMAL HIGH
WBC: 9.2

## 2011-09-19 LAB — B-NATRIURETIC PEPTIDE (CONVERTED LAB): Pro B Natriuretic peptide (BNP): 39.6

## 2011-09-19 LAB — DIFFERENTIAL
Eosinophils Absolute: 0.4
Eosinophils Relative: 4
Lymphs Abs: 0.6 — ABNORMAL LOW

## 2011-09-19 LAB — COMPREHENSIVE METABOLIC PANEL
ALT: 13
AST: 19
Alkaline Phosphatase: 82
CO2: 31
Calcium: 8.2 — ABNORMAL LOW
Chloride: 101
GFR calc Af Amer: >60
GFR calc non Af Amer: 53 — ABNORMAL LOW
Potassium: 4.3
Sodium: 138

## 2011-09-22 LAB — CBC
MCHC: 32.5
RBC: 5.73
RDW: 18.8 — ABNORMAL HIGH

## 2011-09-22 LAB — BASIC METABOLIC PANEL
CO2: 32
Calcium: 8 — ABNORMAL LOW
Creatinine, Ser: 1.37
GFR calc Af Amer: >60
Glucose, Bld: 101 — ABNORMAL HIGH

## 2011-09-22 LAB — B-NATRIURETIC PEPTIDE (CONVERTED LAB): Pro B Natriuretic peptide (BNP): 31.8

## 2011-09-22 LAB — DIFFERENTIAL
Basophils Absolute: 0
Basophils Relative: 0
Neutro Abs: 10 — ABNORMAL HIGH
Neutrophils Relative %: 87 — ABNORMAL HIGH

## 2011-09-23 LAB — CBC
HCT: 36.3 — ABNORMAL LOW
HCT: 36.4 — ABNORMAL LOW
HCT: 34.6 — ABNORMAL LOW
Hemoglobin: 11.5 — ABNORMAL LOW
Hemoglobin: 11.7 — ABNORMAL LOW
Hemoglobin: 11.3 — ABNORMAL LOW
MCHC: 31.6
MCHC: 32.6
MCV: 69.1 — ABNORMAL LOW
MCV: 69.2 — ABNORMAL LOW
MCV: 69.4 — ABNORMAL LOW
MCV: 69.6 — ABNORMAL LOW
MCV: 70.8 — ABNORMAL LOW
Platelets: 190
Platelets: 190
Platelets: 195
Platelets: 207
Platelets: 236
Platelets: 312
RBC: 5.05
RBC: 5.27
RBC: 5.27
RBC: 4.89
RDW: 18.9 — ABNORMAL HIGH
RDW: 19.6 — ABNORMAL HIGH
RDW: 19.8 — ABNORMAL HIGH
RDW: 19.6 — ABNORMAL HIGH
WBC: 10
WBC: 7.4
WBC: 8
WBC: 8.1
WBC: 8.1
WBC: 8.4
WBC: 8.6
WBC: 8.6

## 2011-09-23 LAB — COMPREHENSIVE METABOLIC PANEL
ALT: 18
ALT: <8
AST: 22
AST: 24
AST: 22
Albumin: 1.8 — ABNORMAL LOW
Albumin: 1.9 — ABNORMAL LOW
Albumin: 1.7 — ABNORMAL LOW
Alkaline Phosphatase: 71
Alkaline Phosphatase: 78
BUN: 11
CO2: 27
Chloride: 100
Chloride: 105
Chloride: 102
GFR calc Af Amer: 59 — ABNORMAL LOW
GFR calc Af Amer: >60
GFR calc non Af Amer: 58 — ABNORMAL LOW
Potassium: 4
Potassium: 4.2
Potassium: 4.5
Sodium: 138
Sodium: 135
Total Bilirubin: 0.4
Total Bilirubin: 0.6
Total Bilirubin: 1.1
Total Protein: 4.4 — ABNORMAL LOW

## 2011-09-23 LAB — WOUND CULTURE

## 2011-09-23 LAB — URINALYSIS, ROUTINE W REFLEX MICROSCOPIC
Bilirubin Urine: NEGATIVE
Bilirubin Urine: NEGATIVE
Glucose, UA: NEGATIVE
Hgb urine dipstick: NEGATIVE
Ketones, ur: 15 — AB
Nitrite: NEGATIVE
Nitrite: NEGATIVE
Protein, ur: NEGATIVE
Specific Gravity, Urine: 1.019
Specific Gravity, Urine: 1.016
Urobilinogen, UA: 0.2
pH: 5.5
pH: 6

## 2011-09-23 LAB — MICROALBUMIN / CREATININE URINE RATIO: Microalb Creat Ratio: 2.1

## 2011-09-23 LAB — HEPATIC FUNCTION PANEL
Albumin: 1.9 — ABNORMAL LOW
Bilirubin, Direct: 0.2
Indirect Bilirubin: 0.5
Total Bilirubin: 0.7

## 2011-09-23 LAB — GC/CHLAMYDIA PROBE AMP, URINE: GC Probe Amp, Urine: NEGATIVE

## 2011-09-23 LAB — GRAM STAIN

## 2011-09-23 LAB — BASIC METABOLIC PANEL
BUN: 11
BUN: 12
BUN: 14
Calcium: 8 — ABNORMAL LOW
Chloride: 104
Chloride: 105
Chloride: 106
Creatinine, Ser: 1.25
Creatinine, Ser: 1.26
GFR calc Af Amer: >60
GFR calc non Af Amer: 53 — ABNORMAL LOW
GFR calc non Af Amer: >60
Glucose, Bld: 100 — ABNORMAL HIGH
Glucose, Bld: 101 — ABNORMAL HIGH
Potassium: 4.1
Sodium: 139

## 2011-09-23 LAB — PROTEIN ELECTROPHORESIS, SERUM
Alpha-1-Globulin: 10.1 — ABNORMAL HIGH
Gamma Globulin: 9.5 — ABNORMAL LOW
M-Spike, %: NOT DETECTED
Total Protein ELP: 4.5 — ABNORMAL LOW

## 2011-09-23 LAB — APTT: aPTT: 28

## 2011-09-23 LAB — FOLATE: Folate: 8.9

## 2011-09-23 LAB — RENAL FUNCTION PANEL
BUN: 17
CO2: 28
Chloride: 103
Creatinine, Ser: 1.38
GFR calc non Af Amer: 55 — ABNORMAL LOW
Glucose, Bld: 100 — ABNORMAL HIGH

## 2011-09-23 LAB — BODY FLUID CELL COUNT WITH DIFFERENTIAL

## 2011-09-23 LAB — COMPREHENSIVE METABOLIC PANEL WITH GFR
CO2: 27
Calcium: 7.7 — ABNORMAL LOW
Creatinine, Ser: 1.31
GFR calc Af Amer: >60
GFR calc non Af Amer: 58 — ABNORMAL LOW
Glucose, Bld: 80

## 2011-09-23 LAB — VITAMIN B12: Vitamin B-12: 222 (ref 211–911)

## 2011-09-23 LAB — CARDIAC PANEL(CRET KIN+CKTOT+MB+TROPI)
Total CK: 143
Troponin I: <0.01

## 2011-09-23 LAB — UIFE/LIGHT CHAINS/TP QN, 24-HR UR
Alpha 1, Urine: NOT DETECTED
Alpha 2, Urine: NOT DETECTED
Free Kappa Lt Chains,Ur: 3.75 — ABNORMAL HIGH (ref 0.04–1.51)
Gamma Globulin, Urine: NOT DETECTED
Time: 24

## 2011-09-23 LAB — SEDIMENTATION RATE
Sed Rate: 12
Sed Rate: 18 — ABNORMAL HIGH

## 2011-09-23 LAB — DIFFERENTIAL
Basophils Absolute: 0.1
Basophils Relative: 1
Eosinophils Absolute: 0.4
Eosinophils Relative: 4
Lymphocytes Relative: 5 — ABNORMAL LOW
Lymphs Abs: 0.4 — ABNORMAL LOW
Monocytes Absolute: 0.7
Monocytes Relative: 8
Neutro Abs: 7.1
Neutrophils Relative %: 83 — ABNORMAL HIGH

## 2011-09-23 LAB — CULTURE, BLOOD (ROUTINE X 2)

## 2011-09-23 LAB — SYNOVIAL CELL COUNT + DIFF, W/ CRYSTALS
Eosinophils-Synovial: UNDETERMINED
WBC, Synovial: UNDETERMINED

## 2011-09-23 LAB — RETICULOCYTES
RBC.: 5.5
Retic Count, Absolute: 55
Retic Ct Pct: 1

## 2011-09-23 LAB — PROTEIN, URINE, 24 HOUR
Collection Interval-UPROT: 24
Protein, 24H Urine: 153 — ABNORMAL HIGH
Urine Total Volume-UPROT: 1275

## 2011-09-23 LAB — IRON AND TIBC: Saturation Ratios: 9 — ABNORMAL LOW

## 2011-09-23 LAB — URINE CULTURE
Colony Count: NO GROWTH
Special Requests: NEGATIVE

## 2011-09-23 LAB — SODIUM, URINE, RANDOM: Sodium, Ur: 35

## 2011-09-23 LAB — TSH: TSH: 2.94

## 2011-09-23 LAB — FERRITIN: Ferritin: 116 (ref 22–322)

## 2011-09-23 LAB — CREATININE, URINE, RANDOM: Creatinine, Urine: 135.2

## 2011-09-23 LAB — HIV ANTIBODY (ROUTINE TESTING W REFLEX): HIV: NONREACTIVE

## 2011-09-23 LAB — B-NATRIURETIC PEPTIDE (CONVERTED LAB): Pro B Natriuretic peptide (BNP): 56

## 2011-10-09 LAB — CBC
Hemoglobin: 12.1 — ABNORMAL LOW
MCHC: 31.4
MCV: 69.2 — ABNORMAL LOW
RBC: 5.57
RDW: 17.7 — ABNORMAL HIGH

## 2011-10-09 LAB — DIFFERENTIAL
Basophils Relative: 0
Eosinophils Absolute: 0.3
Eosinophils Relative: 6 — ABNORMAL HIGH
Lymphs Abs: 0.5 — ABNORMAL LOW
Monocytes Absolute: 1.1 — ABNORMAL HIGH
Neutrophils Relative %: 66

## 2011-10-09 LAB — BASIC METABOLIC PANEL
CO2: 29
Calcium: 7.8 — ABNORMAL LOW
Chloride: 106
Creatinine, Ser: 1.37
GFR calc Af Amer: >60
Glucose, Bld: 87

## 2011-12-07 ENCOUNTER — Emergency Department (HOSPITAL_COMMUNITY)
Admission: EM | Admit: 2011-12-07 | Discharge: 2011-12-07 | Disposition: A | Discharge status: Home / Self Care | Payer: Medicare Other | Attending: Emergency Medicine | Admitting: Emergency Medicine

## 2011-12-07 DIAGNOSIS — N3090 Cystitis, unspecified without hematuria: Secondary | ICD-10-CM | POA: Insufficient documentation

## 2011-12-07 DIAGNOSIS — I10 Essential (primary) hypertension: Secondary | ICD-10-CM | POA: Insufficient documentation

## 2011-12-07 DIAGNOSIS — R319 Hematuria, unspecified: Secondary | ICD-10-CM | POA: Insufficient documentation

## 2011-12-07 HISTORY — DX: Disorder of kidney and ureter, unspecified: N28.9

## 2011-12-07 HISTORY — DX: Essential (primary) hypertension: I10

## 2011-12-07 LAB — URINE MICROSCOPIC-ADD ON

## 2011-12-07 LAB — URINALYSIS, ROUTINE W REFLEX MICROSCOPIC
Glucose, UA: NEGATIVE mg/dL
Protein, ur: 30 mg/dL — AB

## 2011-12-07 MED ORDER — CIPROFLOXACIN HCL 500 MG PO TABS
500.0000 mg | ORAL_TABLET | Freq: Once | ORAL | Status: AC
  Administered 2011-12-07: 500 mg via ORAL
  Filled 2011-12-07: qty 1

## 2011-12-07 MED ORDER — PHENAZOPYRIDINE HCL 200 MG PO TABS: 200.0000 mg | ORAL_TABLET | Freq: Three times a day (TID) | ORAL | Status: AC

## 2011-12-07 MED ORDER — CIPROFLOXACIN HCL 500 MG PO TABS: 500.0000 mg | ORAL_TABLET | Freq: Once | ORAL | Status: AC

## 2011-12-07 MED ORDER — PHENAZOPYRIDINE HCL 200 MG PO TABS
200.0000 mg | ORAL_TABLET | Freq: Three times a day (TID) | ORAL | Status: DC
  Administered 2011-12-07: 200 mg via ORAL
  Filled 2011-12-07: qty 1

## 2011-12-07 NOTE — Discharge Instructions (Signed)
 Return if you have sudden severe worse abdominal or back pain, high fevers, or persistent vomiting over the next few days.  It is important that you follow up with your regular doctor in 1-2 weeks for follow up to make sure that your infection has cleared.      Urinary Tract Infection Infections of the urinary tract can start in several places. A bladder infection (cystitis), a kidney infection (pyelonephritis), and a prostate infection (prostatitis) are different types of urinary tract infections (UTIs). They usually get better if treated with medicines (antibiotics) that kill germs. Take all the medicine until it is gone. You or your child may feel better in a few days, but TAKE ALL MEDICINE or the infection may not respond and may become more difficult to treat. HOME CARE INSTRUCTIONS   Drink enough water and fluids to keep the urine clear or pale yellow. Cranberry juice is especially recommended, in addition to large amounts of water.   Avoid caffeine, tea, and carbonated beverages. They tend to irritate the bladder.   Alcohol  may irritate the prostate.   Only take over-the-counter or prescription medicines for pain, discomfort, or fever as directed by your caregiver.  To prevent further infections:  Empty the bladder often. Avoid holding urine for long periods of time.   After a bowel movement, women should cleanse from front to back. Use each tissue only once.   Empty the bladder before and after sexual intercourse.  FINDING OUT THE RESULTS OF YOUR TEST Not all test results are available during your visit. If your or your child's test results are not back during the visit, make an appointment with your caregiver to find out the results. Do not assume everything is normal if you have not heard from your caregiver or the medical facility. It is important for you to follow up on all test results. SEEK MEDICAL CARE IF:   There is back pain.   Your baby is older than 3 months with a  rectal temperature of 100.5 F (38.1 C) or higher for more than 1 day.   Your or your child's problems (symptoms) are no better in 3 days. Return sooner if you or your child is getting worse.  SEEK IMMEDIATE MEDICAL CARE IF:   There is severe back pain or lower abdominal pain.   You or your child develops chills.   You have a fever.   Your baby is older than 3 months with a rectal temperature of 102 F (38.9 C) or higher.   Your baby is 79 months old or younger with a rectal temperature of 100.4 F (38 C) or higher.   There is nausea or vomiting.   There is continued burning or discomfort with urination.  MAKE SURE YOU:   Understand these instructions.   Will watch your condition.   Will get help right away if you are not doing well or get worse.  Document Released: 09/24/2005 Document Revised: 08/27/2011 Document Reviewed: 04/29/2007 [facility] Patient Information 2012 [place], [place].

## 2011-12-07 NOTE — ED Notes (Signed)
Pt in from home with c/o weakness and blood in urine states onset since Thursday pt states pain and burning with urination

## 2011-12-07 NOTE — ED Provider Notes (Signed)
History     CSN: [id number] Arrival date & time: 12/07/2011  9:36 AM   First MD Initiated Contact with Patient 12/07/11 1034      Chief Complaint  Patient presents with  . Hematuria    (Consider location/radiation/quality/duration/timing/severity/associated sxs/prior treatment) HPI Comments: Patient reports about 3 days of suprapubic discomfort associated with dysuria. He reports his urine has been dark in color. He states that he was diagnosed with a urinary tract infection several months ago and was given antibiotics with improvement. He denies any fever or chills. He denies any recent nausea or vomiting. He denies any radiation of the pain to his flanks or back area. She denies any diarrhea or constipation. He reports urination makes the pain worse.  Patient is a 50 y.o. male presenting with hematuria. The history is provided by the patient.  Hematuria Irritative symptoms include frequency. Associated symptoms include dysuria. Pertinent negatives include no abdominal pain, chills, fever, flank pain, nausea or vomiting.    Past Medical History  Diagnosis Date  . Renal disorder   . Hypertension     History reviewed. No pertinent past surgical history.  History reviewed. No pertinent family history.  History  Substance Use Topics  . Smoking status: Current Some Day Smoker  . Smokeless tobacco: Not on file  . Alcohol Use: No      Review of Systems  Constitutional: Negative.  Negative for fever, chills, activity change and appetite change.  Gastrointestinal: Negative for nausea, vomiting, abdominal pain, diarrhea, constipation, blood in stool and abdominal distention.  Genitourinary: Positive for dysuria, frequency and hematuria. Negative for flank pain, scrotal swelling, genital sores and testicular pain.  Musculoskeletal: Negative for back pain.  Skin: Negative for rash.    Allergies  Sulfa antibiotics  Home Medications   Current Outpatient Rx  Name Route Sig  Dispense Refill  . ASPIRIN 81 MG PO CHEW Oral Chew 81 mg by mouth daily.      Marland Kitchen BENAZEPRIL-HYDROCHLOROTHIAZIDE 20-25 MG PO TABS Oral Take 1 tablet by mouth daily.      Marland Kitchen DM-GUAIFENESIN ER 30-600 MG PO TB12 Oral Take 1 tablet by mouth every 12 (twelve) hours.      . FUROSEMIDE 40 MG PO TABS Oral Take 40 mg by mouth daily.      . IBUPROFEN 800 MG PO TABS Oral Take 800 mg by mouth every 8 (eight) hours as needed. pain     . MENTHOL 9.1 MG MT LOZG Mouth/Throat Use as directed 1 lozenge in the mouth or throat as needed. Cough-sore throat     . POTASSIUM CHLORIDE CRYS CR 20 MEQ PO TBCR Oral Take 20 mEq by mouth 2 (two) times daily.      Marland Kitchen ROSUVASTATIN CALCIUM 20 MG PO TABS Oral Take 20 mg by mouth daily.      . TRAMADOL HCL 50 MG PO TABS Oral Take 50 mg by mouth every 6 (six) hours as needed. Maximum dose= 8 tablets per day       BP 105/72  Pulse 80  Temp(Src) 98.6 F (37 C) (Oral)  Resp 20  SpO2 98%  Physical Exam  Nursing note and vitals reviewed. Constitutional: He is oriented to person, place, and time. He appears well-developed and well-nourished.  HENT:  Head: Normocephalic.  Pulmonary/Chest: Effort normal.  Abdominal: Soft. Normal appearance. He exhibits no mass. There is no tenderness. There is no rebound and no CVA tenderness.  Musculoskeletal: He exhibits no edema.  Neurological: He is alert and oriented  to person, place, and time.    ED Course  Procedures (including critical care time)  Labs Reviewed  URINALYSIS, ROUTINE W REFLEX MICROSCOPIC - Abnormal; Notable for the following:    APPearance CLOUDY (*)    Hgb urine dipstick LARGE (*)    Protein, ur 30 (*)    Leukocytes, UA MODERATE (*)    All other components within normal limits  URINE MICROSCOPIC-ADD ON - Abnormal; Notable for the following:    Bacteria, UA MANY (*)    All other components within normal limits  URINE CULTURE   No results found.   No diagnosis found.    MDM    UA suggests cystitis.  Will  send culture for follow up.  Will prescribe Cipro for 5 days and pt is instructed to follow up with his PCP at [facility] for follow up next week.  He is told to return for high fevers, persistent vomiting or sudden worsening of pain.          Gavin Pound. Abdulrahim Siddiqi, MD 12/07/11 1130

## 2012-11-15 ENCOUNTER — Emergency Department (HOSPITAL_COMMUNITY)
Admission: EM | Admit: 2012-11-15 | Discharge: 2012-11-15 | Disposition: A | Discharge status: Home / Self Care | Payer: Medicare Other | Attending: Emergency Medicine | Admitting: Emergency Medicine

## 2012-11-15 DIAGNOSIS — I10 Essential (primary) hypertension: Secondary | ICD-10-CM | POA: Insufficient documentation

## 2012-11-15 DIAGNOSIS — Z79899 Other long term (current) drug therapy: Secondary | ICD-10-CM | POA: Insufficient documentation

## 2012-11-15 DIAGNOSIS — F17200 Nicotine dependence, unspecified, uncomplicated: Secondary | ICD-10-CM | POA: Insufficient documentation

## 2012-11-15 DIAGNOSIS — Z87448 Personal history of other diseases of urinary system: Secondary | ICD-10-CM | POA: Insufficient documentation

## 2012-11-15 DIAGNOSIS — K047 Periapical abscess without sinus: Secondary | ICD-10-CM

## 2012-11-15 DIAGNOSIS — Z7982 Long term (current) use of aspirin: Secondary | ICD-10-CM | POA: Insufficient documentation

## 2012-11-15 MED ORDER — LIDOCAINE-EPINEPHRINE 2 %-1:100000 IJ SOLN
INTRAMUSCULAR | Status: AC
  Administered 2012-11-15: 10:00:00 via DENTAL
  Filled 2012-11-15: qty 1

## 2012-11-15 MED ORDER — LIDOCAINE HCL 2 % IJ SOLN
INTRAMUSCULAR | Status: AC
  Filled 2012-11-15: qty 20

## 2012-11-15 MED ORDER — OXYCODONE-ACETAMINOPHEN 5-325 MG PO TABS: 1.0000 | ORAL_TABLET | ORAL | Status: DC | PRN

## 2012-11-15 MED ORDER — PENICILLIN V POTASSIUM 500 MG PO TABS: 500.0000 mg | ORAL_TABLET | Freq: Three times a day (TID) | ORAL | Status: DC

## 2012-11-15 NOTE — ED Provider Notes (Signed)
History     CSN: [id number]  Arrival date & time 11/15/12  0919   First MD Initiated Contact with Patient 11/15/12 [phone]      Chief Complaint  Patient presents with  . Dental Pain    (Consider location/radiation/quality/duration/timing/severity/associated sxs/prior treatment) HPI  Patient complaining of dental pain began Friday with increased pain and swelling since Saturday (two days.).  No fever, swelling in floor of mouth, neck pain, nausea, or vomiting.  Patient with history of poor dentition and no dentis.    Past Medical History  Diagnosis Date  . Renal disorder   . Hypertension     No past surgical history on file.  No family history on file.  History  Substance Use Topics  . Smoking status: Current Some Day Smoker  . Smokeless tobacco: Not on file  . Alcohol Use: No      Review of Systems  Constitutional: Negative.   HENT: Negative.   Eyes: Negative.   Gastrointestinal: Negative.   Skin: Negative.     Allergies  Sulfa antibiotics  Home Medications   Current Outpatient Rx  Name  Route  Sig  Dispense  Refill  . ASPIRIN EC 81 MG PO TBEC   Oral   Take 81 mg by mouth daily.         Marland Kitchen BENAZEPRIL-HYDROCHLOROTHIAZIDE 20-25 MG PO TABS   Oral   Take 1 tablet by mouth daily.           . FUROSEMIDE 40 MG PO TABS   Oral   Take 40 mg by mouth daily.           . IBUPROFEN 800 MG PO TABS   Oral   Take 800 mg by mouth every 8 (eight) hours as needed. pain          . POTASSIUM CHLORIDE CRYS ER 20 MEQ PO TBCR   Oral   Take 20 mEq by mouth 2 (two) times daily.           Marland Kitchen ROSUVASTATIN CALCIUM 20 MG PO TABS   Oral   Take 20 mg by mouth daily.           . TRAMADOL HCL 50 MG PO TABS   Oral   Take 50 mg by mouth every 6 (six) hours as needed. Maximum dose= 8 tablets per day            BP 117/75  Pulse 90  Temp 98.6 F (37 C)  Resp 16  SpO2 99%  Physical Exam  Constitutional: He appears well-developed and well-nourished.  HENT:    Head: Normocephalic and atraumatic.  Mouth/Throat: Uvula is midline.         Diffuse poor dentition with most teeth missing.  Left lower frontal incisor with severe carie and abscess in gum.    Eyes: Conjunctivae normal are normal. Pupils are equal, round, and reactive to light.  Neck: Trachea normal and normal range of motion. No mass and no thyromegaly present.  Cardiovascular: Normal rate.   Pulmonary/Chest: Effort normal and breath sounds normal.  Abdominal: Soft. Bowel sounds are normal.  Neurological: He is alert.  Skin: Skin is warm and dry.  Psychiatric: He has a normal mood and affect.    ED Course  Dental Date/Time: 11/15/2012 9:56 AM Performed by: Hilario Quarry Authorized by: Hilario Quarry Consent: Verbal consent obtained. Risks and benefits: risks, benefits and alternatives were discussed Consent given by: patient Patient understanding: patient states understanding of the procedure  being performed Patient identity confirmed: verbally with patient Time out: Immediately prior to procedure a "time out" was called to verify the correct patient, procedure, equipment, support staff and site/side marked as required. Local anesthesia used: yes Anesthesia: local infiltration Local anesthetic: lidocaine 2% with epinephrine Anesthetic total: 1 ml Patient tolerance: Patient tolerated the procedure well with no immediate complications. Comments: #11 blade used to incise abscess with moderate amount of pus.  Patient with irrigation and packing placed  Patient feels improved pain level after i and d.    (including critical care time)  Labs Reviewed - No data to display No results found.   No diagnosis found.    MDM  Plan pen vk and pain meds. Patient given resource guide. Patient with toothache.    Exam unconcerning for Ludwig's angina or spread of infection.  Will treat with penicillin and pain medicine.  Urged patient to follow-up with dentist.        Hilario Quarry, MD 11/15/12 [phone]

## 2012-11-15 NOTE — ED Notes (Signed)
Pt began having dental pain on Friday  Pt has pain on left side of face. Pt had swollen gums on lower front gums.

## 2013-06-08 ENCOUNTER — Encounter (HOSPITAL_COMMUNITY): Payer: Self-pay | Admitting: Emergency Medicine

## 2013-06-08 ENCOUNTER — Observation Stay (HOSPITAL_COMMUNITY)
Admission: EM | Admit: 2013-06-08 | Discharge: 2013-06-09 | Disposition: A | Discharge status: Home / Self Care | Payer: Medicare Other | Attending: Internal Medicine | Admitting: Internal Medicine

## 2013-06-08 DIAGNOSIS — I82401 Acute embolism and thrombosis of unspecified deep veins of right lower extremity: Secondary | ICD-10-CM | POA: Clinically undetermined

## 2013-06-08 DIAGNOSIS — I10 Essential (primary) hypertension: Secondary | ICD-10-CM | POA: Insufficient documentation

## 2013-06-08 DIAGNOSIS — Z79899 Other long term (current) drug therapy: Secondary | ICD-10-CM | POA: Insufficient documentation

## 2013-06-08 DIAGNOSIS — F17200 Nicotine dependence, unspecified, uncomplicated: Secondary | ICD-10-CM | POA: Insufficient documentation

## 2013-06-08 DIAGNOSIS — E785 Hyperlipidemia, unspecified: Secondary | ICD-10-CM | POA: Insufficient documentation

## 2013-06-08 DIAGNOSIS — N179 Acute kidney failure, unspecified: Secondary | ICD-10-CM | POA: Insufficient documentation

## 2013-06-08 DIAGNOSIS — I82409 Acute embolism and thrombosis of unspecified deep veins of unspecified lower extremity: Principal | ICD-10-CM | POA: Insufficient documentation

## 2013-06-08 DIAGNOSIS — M7989 Other specified soft tissue disorders: Secondary | ICD-10-CM

## 2013-06-08 HISTORY — DX: Pure hypercholesterolemia, unspecified: E78.00

## 2013-06-08 LAB — CBC WITH DIFFERENTIAL/PLATELET
Eosinophils Relative: 8 % — ABNORMAL HIGH (ref 0–5)
Lymphocytes Relative: 11 % — ABNORMAL LOW (ref 12–46)
Lymphs Abs: 0.6 10*3/uL — ABNORMAL LOW (ref 0.7–4.0)
MCV: 68.9 fL — ABNORMAL LOW (ref 78.0–100.0)
Neutro Abs: 4 10*3/uL (ref 1.7–7.7)
Neutrophils Relative %: 69 % (ref 43–77)
Platelets: 118 10*3/uL — ABNORMAL LOW (ref 150–400)
RBC: 5.27 MIL/uL (ref 4.22–5.81)
WBC: 5.8 10*3/uL (ref 4.0–10.5)

## 2013-06-08 LAB — COMPREHENSIVE METABOLIC PANEL
ALT: 14 U/L (ref 0–53)
AST: 24 U/L (ref 0–37)
Albumin: 2.7 g/dL — ABNORMAL LOW (ref 3.5–5.2)
Alkaline Phosphatase: 65 U/L (ref 39–117)
CO2: 27 mEq/L (ref 19–32)
Chloride: 103 mEq/L (ref 96–112)
GFR calc non Af Amer: 36 mL/min — ABNORMAL LOW (ref >90)
Potassium: 4.7 mEq/L (ref 3.5–5.1)
Sodium: 137 mEq/L (ref 135–145)
Total Bilirubin: 0.3 mg/dL (ref 0.3–1.2)

## 2013-06-08 MED ORDER — ATORVASTATIN CALCIUM 40 MG PO TABS
40.0000 mg | ORAL_TABLET | Freq: Every day | ORAL | Status: DC
  Administered 2013-06-09: 40 mg via ORAL
  Filled 2013-06-08 (×2): qty 1

## 2013-06-08 MED ORDER — FUROSEMIDE 40 MG PO TABS
40.0000 mg | ORAL_TABLET | Freq: Every day | ORAL | Status: DC
  Administered 2013-06-09: 40 mg via ORAL
  Filled 2013-06-08: qty 1

## 2013-06-08 MED ORDER — HEPARIN (PORCINE) IN NACL 100-0.45 UNIT/ML-% IJ SOLN
1800.0000 [IU]/h | INTRAMUSCULAR | Status: DC
  Administered 2013-06-08: 1800 [IU]/h via INTRAVENOUS
  Filled 2013-06-08 (×4): qty 250

## 2013-06-08 MED ORDER — SODIUM CHLORIDE 0.9 % IV SOLN
INTRAVENOUS | Status: DC
  Administered 2013-06-08: 20:00:00 via INTRAVENOUS

## 2013-06-08 MED ORDER — HYDROCHLOROTHIAZIDE 25 MG PO TABS
25.0000 mg | ORAL_TABLET | Freq: Every day | ORAL | Status: DC
  Administered 2013-06-09: 25 mg via ORAL
  Filled 2013-06-08: qty 1

## 2013-06-08 MED ORDER — ASPIRIN EC 81 MG PO TBEC
81.0000 mg | DELAYED_RELEASE_TABLET | Freq: Every day | ORAL | Status: DC
  Administered 2013-06-09: 81 mg via ORAL
  Filled 2013-06-08: qty 1

## 2013-06-08 MED ORDER — SODIUM CHLORIDE 0.9 % IJ SOLN
3.0000 mL | Freq: Two times a day (BID) | INTRAMUSCULAR | Status: DC
  Administered 2013-06-08: 3 mL via INTRAVENOUS

## 2013-06-08 MED ORDER — BENAZEPRIL HCL 20 MG PO TABS
20.0000 mg | ORAL_TABLET | Freq: Every day | ORAL | Status: DC
  Administered 2013-06-09: 20 mg via ORAL
  Filled 2013-06-08: qty 1

## 2013-06-08 MED ORDER — BENAZEPRIL-HYDROCHLOROTHIAZIDE 20-25 MG PO TABS: 1.0000 | ORAL_TABLET | Freq: Every day | ORAL | Status: DC

## 2013-06-08 MED ORDER — POTASSIUM CHLORIDE CRYS ER 20 MEQ PO TBCR
20.0000 meq | EXTENDED_RELEASE_TABLET | Freq: Two times a day (BID) | ORAL | Status: DC
  Administered 2013-06-08: 20 meq via ORAL
  Filled 2013-06-08 (×3): qty 1

## 2013-06-08 MED ORDER — TRAMADOL HCL 50 MG PO TABS
50.0000 mg | ORAL_TABLET | Freq: Four times a day (QID) | ORAL | Status: DC | PRN
  Administered 2013-06-09: 50 mg via ORAL
  Filled 2013-06-08: qty 1

## 2013-06-08 MED ORDER — HEPARIN BOLUS VIA INFUSION
4000.0000 [IU] | Freq: Once | INTRAVENOUS | Status: AC
  Administered 2013-06-08: 4000 [IU] via INTRAVENOUS

## 2013-06-08 MED ORDER — IBUPROFEN 800 MG PO TABS: 800.0000 mg | ORAL_TABLET | Freq: Three times a day (TID) | ORAL | Status: DC | PRN

## 2013-06-08 NOTE — H&P (Signed)
[facility] History and Physical  Justin [name] WUJ:[id number] DOB: 07-06-1961 DOA: 06/08/2013  Referring physician: ED PCP: No primary provider on file.  Specialists: None  Chief Complaint: Leg swelling  HPI: Justin [name] is a 52 y.o. male who presents with 11 day history of RLE swelling.  Symptoms onset on 05/29/13 and have been waxing and waning since that time.  No CP no SOB, not really any leg pain, its just that it has swollen up.  No recent travel, no h/o previous DVT.  He and his brother became concerned regarding his leg swelling and so presented to the ED.  In the ED d.dimer was positive.  Due to the time of night ultrasound cannot be obtained until AM, EDP is concerned given how proximal the DVT would be due to the degree of swelling and so hospitalist has been asked to admit.  Review of Systems: 12 systems reviewed and otherwise negative.  Past Medical History  Diagnosis Date  . Renal disorder   . Hypertension   . Hypercholesteremia    History reviewed. No pertinent past surgical history. Social History:  reports that he has been smoking.  He does not have any smokeless tobacco history on file. He reports that he does not drink alcohol or use illicit drugs.   Allergies  Allergen Reactions  . Sulfa Antibiotics Diarrhea    History reviewed. No pertinent family history.  Prior to Admission medications   Medication Sig Start Date End Date Taking? Authorizing Provider  aspirin EC 81 MG tablet Take 81 mg by mouth daily.   Yes Historical Provider, MD  benazepril-hydrochlorthiazide (LOTENSIN HCT) 20-25 MG per tablet Take 1 tablet by mouth daily.     Yes Historical Provider, MD  furosemide (LASIX) 40 MG tablet Take 40 mg by mouth daily.     Yes Historical Provider, MD  ibuprofen (ADVIL,MOTRIN) 800 MG tablet Take 800 mg by mouth every 8 (eight) hours as needed. pain    Yes Historical Provider, MD  potassium chloride SA (K-DUR,KLOR-CON) 20 MEQ tablet Take 20 mEq by  mouth 2 (two) times daily.     Yes Historical Provider, MD  rosuvastatin (CRESTOR) 20 MG tablet Take 20 mg by mouth daily.     Yes Historical Provider, MD  traMADol (ULTRAM) 50 MG tablet Take 50 mg by mouth every 6 (six) hours as needed. Maximum dose= 8 tablets per day    Yes Historical Provider, MD   Physical Exam: Filed Vitals:   06/08/13 1854  BP: 119/71  Pulse: 82  Temp: 98.5 F (36.9 C)  TempSrc: Oral  Resp: 18  SpO2: 99%     General:  NAD, resting comfortably in bed  Eyes: PEERLA EOMI  ENT: mucous membranes moist  Neck: supple w/o JVD  Cardiovascular: RRR w/o MRG  Respiratory: CTA B  Abdomen: soft, nt, nd, bs+  Skin: no rash nor lesion  Musculoskeletal: MAE, full ROM all 4 extremities, RLE edema from thigh all the way down to foot, normal pulses, normal neuro  Psychiatric: normal tone and affect  Neurologic: AAOx3, grossly non-focal   Labs on Admission:  Basic Metabolic Panel:  Recent Labs Lab 06/08/13 1922  NA 137  K 4.7  CL 103  CO2 27  GLUCOSE 90  BUN 44*  CREATININE 1.99*  CALCIUM 8.5   Liver Function Tests:  Recent Labs Lab 06/08/13 1922  AST 24  ALT 14  ALKPHOS 65  BILITOT 0.3  PROT 5.5*  ALBUMIN 2.7*   No  results found for this basename: LIPASE, AMYLASE,  in the last 168 hours No results found for this basename: AMMONIA,  in the last 168 hours CBC:  Recent Labs Lab 06/08/13 1922  WBC 5.8  NEUTROABS 4.0  HGB 11.7*  HCT 36.3*  MCV 68.9*  PLT 118*   Cardiac Enzymes: No results found for this basename: CKTOTAL, CKMB, CKMBINDEX, TROPONINI,  in the last 168 hours  BNP (last 3 results) No results found for this basename: PROBNP,  in the last 8760 hours CBG: No results found for this basename: GLUCAP,  in the last 168 hours  Radiological Exams on Admission: No results found.  EKG: Independently reviewed.  Assessment/Plan Principal Problem:   Right leg DVT   1. Probable Right leg DVT - confirmatory [place] ordered ASAP,  starting emperic heparin gtt, pharm to dose, if DVT confirmed in AM then will likely need coumadin started as well. 2. HTN - continue home meds    Code Status: Full Code (must indicate code status--if unknown or must be presumed, indicate so) Family Communication: Spoke with family at bedside (indicate person spoken with, if applicable, with phone number if by telephone) Disposition Plan: Admit to obs (indicate anticipated LOS)  Time spent: 30 min  Justin [name] M. [facility] Pager [phone]  If 7PM-7AM, please contact night-coverage [email or web address] Password [facility] 06/08/2013, 9:56 PM

## 2013-06-08 NOTE — Progress Notes (Addendum)
ANTICOAGULATION CONSULT NOTE - Initial Consult  Pharmacy Consult for heparin Indication: rule out DVT  Allergies  Allergen Reactions  . Sulfa Antibiotics Diarrhea    Patient Measurements:   Heparin Dosing Weight: 105kg  Vital Signs: Temp: 98.5 F (36.9 C) (06/11 1854) Temp src: Oral (06/11 1854) BP: 119/71 mmHg (06/11 1854) Pulse Rate: 82 (06/11 1854)  Labs:  Recent Labs  06/08/13 1922  HGB 11.7*  HCT 36.3*  PLT 118*  APTT 33  LABPROT 14.3  INR 1.13  CREATININE 1.99*    CrCl is unknown because there is no height on file for the current visit.   Medical History: Past Medical History  Diagnosis Date  . Renal disorder   . Hypertension   . Hypercholesteremia      Assessment: 33 YOM admitted with LE swelling since 6/1, heparin for r/o DVT. d-dimer slightly elevated, baseline INR =1.13. CBC reveals Hgb=11.7 and platelets = 118.  Thrombocytopenia is chronic per previous labs back to 2012.   Goal of Therapy:  Heparin level 0.3-0.7 units/ml Monitor platelets by anticoagulation protocol: Yes   Plan:   Heparin bolus 4,000 units then heparin 1,800 units/hr  Check 6h heparin level  Daily heparin level and CBC  Await LE dopplers   Juliette Alcide, PharmD, BCPS.   Pager: [phone]  06/08/2013,9:59 PM

## 2013-06-08 NOTE — ED Notes (Signed)
PT states he has had L leg swelling since June 1.  Pt denies pain, just states toes tingle on L foot. Sensation intact, weak pedal pulse.  Swelling extends from toes to hip on R leg.

## 2013-06-08 NOTE — ED Provider Notes (Signed)
History     CSN: [id number]  Arrival date & time 06/08/13  1843   First MD Initiated Contact with Patient 06/08/13 1859      Chief Complaint  Patient presents with  . Leg Swelling    (Consider location/radiation/quality/duration/timing/severity/associated sxs/prior treatment) The history is provided by the patient.   patient here complaining of a one-week history of right lower leg swelling. Denies any, and symptoms have been persistent. No chest pain or shortness of breath. No pleuritic pain. Does have a recent history of travel. Denies any prior history of DVT. No tumor used prior to arrival  Past Medical History  Diagnosis Date  . Renal disorder   . Hypertension   . Hypercholesteremia     History reviewed. No pertinent past surgical history.  History reviewed. No pertinent family history.  History  Substance Use Topics  . Smoking status: Current Some Day Smoker  . Smokeless tobacco: Not on file  . Alcohol Use: No      Review of Systems  All other systems reviewed and are negative.    Allergies  Sulfa antibiotics  Home Medications   Current Outpatient Rx  Name  Route  Sig  Dispense  Refill  . aspirin EC 81 MG tablet   Oral   Take 81 mg by mouth daily.         . furosemide (LASIX) 40 MG tablet   Oral   Take 40 mg by mouth daily.           Marland Kitchen ibuprofen (ADVIL,MOTRIN) 800 MG tablet   Oral   Take 800 mg by mouth every 8 (eight) hours as needed. pain          . potassium chloride SA (K-DUR,KLOR-CON) 20 MEQ tablet   Oral   Take 20 mEq by mouth 2 (two) times daily.           . rosuvastatin (CRESTOR) 20 MG tablet   Oral   Take 20 mg by mouth daily.           . traMADol (ULTRAM) 50 MG tablet   Oral   Take 50 mg by mouth every 6 (six) hours as needed. Maximum dose= 8 tablets per day          . benazepril-hydrochlorthiazide (LOTENSIN HCT) 20-25 MG per tablet   Oral   Take 1 tablet by mouth daily.           Marland Kitchen oxyCODONE-acetaminophen  (PERCOCET/ROXICET) 5-325 MG per tablet   Oral   Take 1 tablet by mouth every 4 (four) hours as needed for pain.   10 tablet   0   . penicillin v potassium (VEETID) 500 MG tablet   Oral   Take 1 tablet (500 mg total) by mouth 3 (three) times daily.   30 tablet   0     BP 119/71  Pulse 82  Temp(Src) 98.5 F (36.9 C) (Oral)  Resp 18  SpO2 99%  Physical Exam  Nursing note and vitals reviewed. Constitutional: He is oriented to person, place, and time. He appears well-developed and well-nourished.  Non-toxic appearance. No distress.  HENT:  Head: Normocephalic and atraumatic.  Eyes: Conjunctivae, EOM and lids are normal. Pupils are equal, round, and reactive to light.  Neck: Normal range of motion. Neck supple. No tracheal deviation present. No mass present.  Cardiovascular: Normal rate, regular rhythm and normal heart sounds.  Exam reveals no gallop.   No murmur heard. Pulmonary/Chest: Effort normal and breath sounds normal.  No stridor. No respiratory distress. He has no decreased breath sounds. He has no wheezes. He has no rhonchi. He has no rales.  Abdominal: Soft. Normal appearance and bowel sounds are normal. He exhibits no distension. There is no tenderness. There is no rebound and no CVA tenderness.  Musculoskeletal: Normal range of motion. He exhibits no edema and no tenderness.  Right lower extremity edema noted from the thigh extending down to the calf. Dorsalis pedis pulse palpable. Femoral pulses normal. Full range of motion at the ankle and toes. Sensation intact. No discoloration to the skin. No crepitus noted  Neurological: He is alert and oriented to person, place, and time. He has normal strength. No cranial nerve deficit or sensory deficit. GCS eye subscore is 4. GCS verbal subscore is 5. GCS motor subscore is 6.  Skin: Skin is warm and dry. No abrasion and no rash noted.  Psychiatric: He has a normal mood and affect. His speech is normal and behavior is normal.     ED Course  Procedures (including critical care time)  Labs Reviewed - No data to display No results found.   No diagnosis found.    MDM  Pt to be admitted for eval of dvt        Toy Baker, MD 06/08/13 2204

## 2013-06-09 DIAGNOSIS — M7989 Other specified soft tissue disorders: Secondary | ICD-10-CM

## 2013-06-09 LAB — CBC
Hemoglobin: 11.6 g/dL — ABNORMAL LOW (ref 13.0–17.0)
MCH: 21.8 pg — ABNORMAL LOW (ref 26.0–34.0)
RBC: 5.32 MIL/uL (ref 4.22–5.81)

## 2013-06-09 LAB — BASIC METABOLIC PANEL
CO2: 28 mEq/L (ref 19–32)
Calcium: 8.5 mg/dL (ref 8.4–10.5)
Glucose, Bld: 92 mg/dL (ref 70–99)
Potassium: 4.5 mEq/L (ref 3.5–5.1)
Sodium: 137 mEq/L (ref 135–145)

## 2013-06-09 MED ORDER — TRAMADOL HCL 50 MG PO TABS
50.0000 mg | ORAL_TABLET | Freq: Two times a day (BID) | ORAL | Status: DC
  Administered 2013-06-09: 50 mg via ORAL
  Filled 2013-06-09: qty 1

## 2013-06-09 MED ORDER — POTASSIUM CHLORIDE CRYS ER 20 MEQ PO TBCR
20.0000 meq | EXTENDED_RELEASE_TABLET | Freq: Every day | ORAL | Status: DC
  Administered 2013-06-09: 20 meq via ORAL
  Filled 2013-06-09 (×2): qty 1

## 2013-06-09 NOTE — Care Management Note (Signed)
    Page 1 of 1   06/09/2013     3:07:37 PM   CARE MANAGEMENT NOTE 06/09/2013  Patient:  Justin [name], Justin [name]   Account Number:  [id number]  Date Initiated:  06/09/2013  Documentation initiated by:  Lanier Clam  Subjective/Objective Assessment:   ADMITTED W/PAIN R LEG.     Action/Plan:   FROM HOME   Anticipated DC Date:  06/09/2013   Anticipated DC Plan:  HOME/SELF CARE      DC Planning Services  CM consult      Choice offered to / List presented to:             Status of service:  Completed, signed off Medicare Important Message given?   (If response is "NO", the following Medicare IM given date fields will be blank) Date Medicare IM given:   Date Additional Medicare IM given:    Discharge Disposition:  HOME/SELF CARE  Per UR Regulation:  Reviewed for med. necessity/level of care/duration of stay  If discussed at Long Length of Stay Meetings, dates discussed:    Comments:  06/09/13 Alwilda Gilland RN,BSN NCM [phone] D/C HOME NO NEEDS ORD ORDERS.

## 2013-06-09 NOTE — Discharge Summary (Signed)
Physician Discharge Summary  Justin [name] AOZ:[id number] DOB: 10-Aug-1961 DOA: 06/08/2013  PCP: No primary provider on file.  Admit date: 06/08/2013 Discharge date: 06/09/2013  Recommendations for Outpatient Follow-up:  1. Follow up with PCP in 1-2 weeks post discharge or sooner if symptoms persist   Discharge Diagnoses:  Principal Problem:   Right leg DVT   Discharge Condition: medically stable for discharge home today   Diet recommendation: as tolerated, heart healthy, low sodium  History of present illness:  52 y.o. male presented to [facility] ED for lower extremity swelling. He reports chronic swelling and he is on lasix. No complaints of shortness of breath, chest pain or palpitations. LE doppler is negative for DVT.  Assessment and Plan:  Principal Problem: Lower extremity swelling - started on heparin drip but this is now discontinued as LE doppler is negative for DVT - pt instructed to continue taking lasix as per home regimen and follow up with PCP in 1-2 weeks to continue to monitor renal function   Active Problems: Dyslipidemia - continue statin therapy Hypertension - continue Lotensin Acute renal failure - likely lasix however renal function improving even while on lasix - continue lasix 40 mg daily  Manson Passey [facility] [phone]  Discharge Exam: Filed Vitals:   06/09/13 0509  BP: 114/72  Pulse: 78  Temp: 98.1 F (36.7 C)  Resp: 18   Filed Vitals:   06/08/13 2100 06/08/13 2256 06/08/13 2300 06/09/13 0509  BP:  111/81 118/73 114/72  Pulse:   77 78  Temp:  98.4 F (36.9 C) 98.1 F (36.7 C) 98.1 F (36.7 C)  TempSrc:  Oral Oral Oral  Resp:  19 18 18   Height: 6' (1.829 m)  6' (1.829 m)   Weight: 123.378 kg (272 lb)  127.5 kg (281 lb 1.4 oz)   SpO2:  99% 100% 100%    General: Pt is alert, follows commands appropriately, not in acute distress Cardiovascular: Regular rate and rhythm, S1/S2 +, no murmurs, no rubs, no gallops Respiratory: Clear to  auscultation bilaterally, no wheezing, no crackles, no rhonchi Abdominal: Soft, non tender, non distended, bowel sounds +, no guarding Extremities: LE +1-2 pitting edema, no cyanosis, pulses palpable bilaterally DP and PT Neuro: Grossly nonfocal  Discharge Instructions  Discharge Orders   Future Orders Complete By Expires     Call MD for:  difficulty breathing, headache or visual disturbances  As directed     Call MD for:  persistant dizziness or light-headedness  As directed     Call MD for:  persistant nausea and vomiting  As directed     Call MD for:  severe uncontrolled pain  As directed     Diet - low sodium heart healthy  As directed     Increase activity slowly  As directed         Medication List    TAKE these medications       aspirin EC 81 MG tablet  Take 81 mg by mouth daily.     benazepril-hydrochlorthiazide 20-25 MG per tablet  Commonly known as:  LOTENSIN HCT  Take 1 tablet by mouth daily.     furosemide 40 MG tablet  Commonly known as:  LASIX  Take 40 mg by mouth daily.     ibuprofen 800 MG tablet  Commonly known as:  ADVIL,MOTRIN  Take 800 mg by mouth every 8 (eight) hours as needed. pain     potassium chloride SA 20 MEQ tablet  Commonly known as:  K-DUR,KLOR-CON  Take 20 mEq by mouth daily with breakfast.     rosuvastatin 20 MG tablet  Commonly known as:  CRESTOR  Take 20 mg by mouth daily.     traMADol 50 MG tablet  Commonly known as:  ULTRAM  Take 50 mg by mouth 2 (two) times daily.          The results of significant diagnostics from this hospitalization (including imaging, microbiology, ancillary and laboratory) are listed below for reference.    Significant Diagnostic Studies: No results found.  Microbiology: No results found for this or any previous visit (from the past 240 hour(s)).   Labs: Basic Metabolic Panel:  Recent Labs Lab 06/08/13 1922 06/09/13 0535  NA 137 137  K 4.7 4.5  CL 103 103  CO2 27 28  GLUCOSE 90 92   BUN 44* 38*  CREATININE 1.99* 1.74*  CALCIUM 8.5 8.5   Liver Function Tests:  Recent Labs Lab 06/08/13 1922  AST 24  ALT 14  ALKPHOS 65  BILITOT 0.3  PROT 5.5*  ALBUMIN 2.7*   No results found for this basename: LIPASE, AMYLASE,  in the last 168 hours No results found for this basename: AMMONIA,  in the last 168 hours CBC:  Recent Labs Lab 06/08/13 1922 06/09/13 0535  WBC 5.8 4.8  NEUTROABS 4.0  --   HGB 11.7* 11.6*  HCT 36.3* 37.0*  MCV 68.9* 69.5*  PLT 118* 109*   Cardiac Enzymes: No results found for this basename: CKTOTAL, CKMB, CKMBINDEX, TROPONINI,  in the last 168 hours BNP: BNP (last 3 results) No results found for this basename: PROBNP,  in the last 8760 hours CBG: No results found for this basename: GLUCAP,  in the last 168 hours  Time coordinating discharge: Over 30 minutes  Signed:  Manson Passey, MD  [facility]  06/09/2013, 12:19 PM  Pager #: [phone]

## 2013-06-09 NOTE — Progress Notes (Signed)
VASCULAR LAB PRELIMINARY  PRELIMINARY  PRELIMINARY  PRELIMINARY  Bilateral lower extremity venous duplex completed.    Preliminary report:  Bilateral:  No evidence of DVT, superficial thrombosis, or Baker's Cyst.   Octavis Sheeler, RVS 06/09/2013, 9:53 AM

## 2013-06-09 NOTE — Progress Notes (Signed)
ANTICOAGULATION CONSULT NOTE - Follow Up  Pharmacy Consult for Heparin Indication: rule out DVT  Allergies  Allergen Reactions  . Sulfa Antibiotics Diarrhea    Patient Measurements: Height: 6' (182.9 cm) Weight: 281 lb 1.Justin oz (127.5 kg) IBW/kg (Calculated) : 77.6 Heparin Dosing Weight: 105kg  Labs:  Recent Labs  06/08/13 1922 06/09/13 0535  HGB 11.7* 11.6*  HCT 36.3* 37.0*  PLT 118* 109*  APTT 33  --   LABPROT 14.3  --   INR 1.13  --   HEPARINUNFRC  --  0.57  CREATININE 1.99* 1.74*    Assessment: 52 YO Justin [name] admitted 06/08/13 with LE swelling since 6/1, heparin started 6/11pm for r/o DVT. D-dimer slightly elevated, baseline INR =1.13. CBC relatively stable from last night.  Thrombocytopenia is chronic per previous labs back to 2012. Ultrasound unable to be obtained last night due to timing of presentation - awaiting this to be performed to confirm or r/o DVT. Heparin started at ~23:00 last night - given 4000 unit IV bolus x1, then started on 1800 units/hr. 1st heparin level is therapeutic, no bleeding reported in chart notes.  Goal of Therapy:  Heparin level 0.3-0.7 units/ml Monitor platelets by anticoagulation protocol: Yes   Plan:   Continue heparin 1,800 units/hr  Recheck 6h heparin level to confirm therapeutic dose  Daily heparin level and CBC  Await LE dopplers to confirm or r/o DVT  Darrol Angel, PharmD Pager: [phone]  06/09/2013,8:36 AM

## 2013-11-08 ENCOUNTER — Emergency Department (HOSPITAL_COMMUNITY): Payer: Medicare Other

## 2013-11-08 ENCOUNTER — Emergency Department (HOSPITAL_COMMUNITY)
Admission: EM | Admit: 2013-11-08 | Discharge: 2013-11-08 | Disposition: A | Discharge status: Home / Self Care | Payer: Medicare Other | Attending: Emergency Medicine | Admitting: Emergency Medicine

## 2013-11-08 ENCOUNTER — Encounter (HOSPITAL_COMMUNITY): Payer: Self-pay | Admitting: Emergency Medicine

## 2013-11-08 DIAGNOSIS — R0602 Shortness of breath: Secondary | ICD-10-CM | POA: Insufficient documentation

## 2013-11-08 DIAGNOSIS — R609 Edema, unspecified: Secondary | ICD-10-CM | POA: Insufficient documentation

## 2013-11-08 DIAGNOSIS — Z87891 Personal history of nicotine dependence: Secondary | ICD-10-CM | POA: Insufficient documentation

## 2013-11-08 DIAGNOSIS — Z79899 Other long term (current) drug therapy: Secondary | ICD-10-CM | POA: Insufficient documentation

## 2013-11-08 DIAGNOSIS — J45909 Unspecified asthma, uncomplicated: Secondary | ICD-10-CM | POA: Insufficient documentation

## 2013-11-08 DIAGNOSIS — N289 Disorder of kidney and ureter, unspecified: Secondary | ICD-10-CM | POA: Insufficient documentation

## 2013-11-08 DIAGNOSIS — R05 Cough: Secondary | ICD-10-CM | POA: Insufficient documentation

## 2013-11-08 DIAGNOSIS — R059 Cough, unspecified: Secondary | ICD-10-CM | POA: Insufficient documentation

## 2013-11-08 DIAGNOSIS — R071 Chest pain on breathing: Secondary | ICD-10-CM | POA: Insufficient documentation

## 2013-11-08 DIAGNOSIS — I509 Heart failure, unspecified: Secondary | ICD-10-CM

## 2013-11-08 DIAGNOSIS — I129 Hypertensive chronic kidney disease with stage 1 through stage 4 chronic kidney disease, or unspecified chronic kidney disease: Secondary | ICD-10-CM | POA: Insufficient documentation

## 2013-11-08 DIAGNOSIS — E7800 Pure hypercholesterolemia, unspecified: Secondary | ICD-10-CM | POA: Insufficient documentation

## 2013-11-08 DIAGNOSIS — Z7982 Long term (current) use of aspirin: Secondary | ICD-10-CM | POA: Insufficient documentation

## 2013-11-08 LAB — BASIC METABOLIC PANEL
Calcium: 8.6 mg/dL (ref 8.4–10.5)
GFR calc Af Amer: 50 mL/min — ABNORMAL LOW (ref >90)
GFR calc non Af Amer: 43 mL/min — ABNORMAL LOW (ref >90)
Glucose, Bld: 92 mg/dL (ref 70–99)
Potassium: 4.3 mEq/L (ref 3.5–5.1)
Sodium: 138 mEq/L (ref 135–145)

## 2013-11-08 LAB — CBC
Hemoglobin: 12.1 g/dL — ABNORMAL LOW (ref 13.0–17.0)
MCV: 71 fL — ABNORMAL LOW (ref 78.0–100.0)
Platelets: 108 10*3/uL — ABNORMAL LOW (ref 150–400)
RBC: 5.44 MIL/uL (ref 4.22–5.81)
RDW: 17.9 % — ABNORMAL HIGH (ref 11.5–15.5)
WBC: 5.9 10*3/uL (ref 4.0–10.5)

## 2013-11-08 LAB — PRO B NATRIURETIC PEPTIDE: Pro B Natriuretic peptide (BNP): 493.4 pg/mL — ABNORMAL HIGH (ref 0–125)

## 2013-11-08 LAB — TROPONIN I: Troponin I: <0.3 ng/mL (ref <0.30)

## 2013-11-08 MED ORDER — ALBUTEROL SULFATE HFA 108 (90 BASE) MCG/ACT IN AERS
2.0000 | INHALATION_SPRAY | RESPIRATORY_TRACT | Status: DC
  Administered 2013-11-08: 2 via RESPIRATORY_TRACT

## 2013-11-08 MED ORDER — ALBUTEROL SULFATE (5 MG/ML) 0.5% IN NEBU
2.5000 mg | INHALATION_SOLUTION | RESPIRATORY_TRACT | Status: DC
  Administered 2013-11-08: 2.5 mg via RESPIRATORY_TRACT
  Filled 2013-11-08: qty 0.5

## 2013-11-08 MED ORDER — IPRATROPIUM BROMIDE 0.02 % IN SOLN
0.5000 mg | RESPIRATORY_TRACT | Status: DC
  Administered 2013-11-08: 0.5 mg via RESPIRATORY_TRACT
  Filled 2013-11-08: qty 2.5

## 2013-11-08 MED ORDER — ALBUTEROL SULFATE HFA 108 (90 BASE) MCG/ACT IN AERS: 2.0000 | INHALATION_SPRAY | RESPIRATORY_TRACT | Status: AC

## 2013-11-08 NOTE — ED Notes (Signed)
SOB and wheezing for about day and half, little chest pain, nonproductive cough, denies fevers

## 2013-11-08 NOTE — ED Provider Notes (Signed)
CSN: [id number]     Arrival date & time 11/08/13  1430 History   First MD Initiated Contact with Patient 11/08/13 1507     Chief Complaint  Patient presents with  . Shortness of Breath   (Consider location/radiation/quality/duration/timing/severity/associated sxs/prior Treatment) Patient is a 52 y.o. male presenting with shortness of breath.  Shortness of Breath Associated symptoms: cough   Associated symptoms: no abdominal pain, no diaphoresis, no fever, no headaches, no rash, no sore throat and no vomiting     52 y/o male here with 2 days of gradually worsening dyspnea. He explains that he has asthma and is out of inhalers at home. He denies smoking, fevers, chills, sweats, and sick contacts. He has had central chest pain that started about 18 hours ago described as mild central chest pain only when he coughs. He also notes increase in his LE edema BL and describes orthopnea that is different than his baseline. He takes 40 mg lasix qd normally and has been compliant lately.   Past Medical History  Diagnosis Date  . Renal disorder   . Hypertension   . Hypercholesteremia    History reviewed. No pertinent past surgical history. History reviewed. No pertinent family history. History  Substance Use Topics  . Smoking status: Former Games developer  . Smokeless tobacco: Not on file  . Alcohol Use: No    Review of Systems  Constitutional: Negative for fever, chills and diaphoresis.  HENT: Negative for sore throat.   Eyes: Negative for visual disturbance.  Respiratory: Positive for cough and shortness of breath.   Gastrointestinal: Negative for nausea, vomiting, abdominal pain and diarrhea.  Genitourinary: Negative for dysuria.  Musculoskeletal: Negative for back pain.  Skin: Negative for rash.  Neurological: Negative for headaches.    Allergies  Sulfa antibiotics  Home Medications   Current Outpatient Rx  Name  Route  Sig  Dispense  Refill  . aspirin EC 81 MG tablet   Oral  Take 81 mg by mouth daily.         . benazepril-hydrochlorthiazide (LOTENSIN HCT) 20-25 MG per tablet   Oral   Take 1 tablet by mouth daily.           . furosemide (LASIX) 40 MG tablet   Oral   Take 40 mg by mouth daily.           Marland Kitchen ibuprofen (ADVIL,MOTRIN) 800 MG tablet   Oral   Take 800 mg by mouth every 8 (eight) hours as needed. pain          . potassium chloride SA (K-DUR,KLOR-CON) 20 MEQ tablet   Oral   Take 20 mEq by mouth daily with breakfast.         . rosuvastatin (CRESTOR) 20 MG tablet   Oral   Take 20 mg by mouth every evening.          . traMADol (ULTRAM) 50 MG tablet   Oral   Take 50 mg by mouth 2 (two) times daily.          BP 118/67  Pulse 81  Temp(Src) 98.5 F (36.9 C) (Oral)  Resp 25  SpO2 97% Physical Exam  Constitutional: He is oriented to person, place, and time. He appears well-developed and well-nourished. No distress.  HENT:  Head: Normocephalic and atraumatic.  Mouth/Throat: Oropharynx is clear and moist.  Eyes: EOM are normal. Pupils are equal, round, and reactive to light.  Neck: Normal range of motion. Neck supple.  Cardiovascular: Normal rate, regular  rhythm and normal heart sounds.   Pulmonary/Chest: Effort normal. No respiratory distress. He has wheezes.  Abdominal: Soft. Bowel sounds are normal. There is no tenderness.  Musculoskeletal: He exhibits edema.  2+ BL LE pitting edema   Neurological: He is alert and oriented to person, place, and time.  Skin: Skin is warm and dry. He is not diaphoretic.  Psychiatric: He has a normal mood and affect.    ED Course  Procedures (including critical care time) Labs Review Labs Reviewed  CBC  PRO B NATRIURETIC PEPTIDE  TROPONIN I  BASIC METABOLIC PANEL   Imaging Review No results found.  EKG Interpretation   None       MDM  No diagnosis found.  52 y/o male with Hx of HTN and asthma here with dyspnea and cough for 2 days. His symptoms are mostl likely due to a  mild asthma exacerbation but with worsening LE edema, lasix use without formal CHF diagnosis, and chest pain I will plan to rule out pulmonary edema as a source of his dyspnea.   CXR, proBNP, CBC, BMP, and troponin X 1 Duoneb given here with symptomsatic improvement Albuterol MDI given also  With mildly elevated BNP and mild pulm vasc congestion will treat as a mild CHF exacerbation. Increase lasix to 60 mg dailty X 3 days. Likely this is multifactorial dyspnea with asthma playing a role as well, PRN albuterol.  Discussed red flags, return for worsening symptoms.  Follow up with PCP in the next 3-5 days.   Murtis Sink, MD [facility] Resident, PGY-2 11/08/2013, 7:15 PM       Elenora Gamma, MD 11/08/13 [phone]

## 2013-11-08 NOTE — ED Provider Notes (Signed)
I saw and evaluated the patient, reviewed the resident's note and I agree with the findings and plan.   .Face to face Exam:  General:  Awake HEENT:  Atraumatic Resp:  Normal effort Abd:  Nondistended Neuro:No focal weakness  Nelia Shi, MD 11/08/13 2312

## 2014-06-10 ENCOUNTER — Emergency Department (HOSPITAL_COMMUNITY)
Admission: EM | Admit: 2014-06-10 | Discharge: 2014-06-10 | Disposition: A | Discharge status: Home / Self Care | Payer: Medicare Other | Attending: Emergency Medicine | Admitting: Emergency Medicine

## 2014-06-10 ENCOUNTER — Encounter (HOSPITAL_COMMUNITY): Payer: Self-pay | Admitting: Emergency Medicine

## 2014-06-10 DIAGNOSIS — Z87891 Personal history of nicotine dependence: Secondary | ICD-10-CM | POA: Insufficient documentation

## 2014-06-10 DIAGNOSIS — R42 Dizziness and giddiness: Secondary | ICD-10-CM | POA: Insufficient documentation

## 2014-06-10 DIAGNOSIS — Z7982 Long term (current) use of aspirin: Secondary | ICD-10-CM | POA: Insufficient documentation

## 2014-06-10 DIAGNOSIS — I10 Essential (primary) hypertension: Secondary | ICD-10-CM | POA: Insufficient documentation

## 2014-06-10 DIAGNOSIS — Z79899 Other long term (current) drug therapy: Secondary | ICD-10-CM | POA: Insufficient documentation

## 2014-06-10 DIAGNOSIS — Z87448 Personal history of other diseases of urinary system: Secondary | ICD-10-CM | POA: Insufficient documentation

## 2014-06-10 DIAGNOSIS — K625 Hemorrhage of anus and rectum: Secondary | ICD-10-CM | POA: Insufficient documentation

## 2014-06-10 DIAGNOSIS — E7800 Pure hypercholesterolemia, unspecified: Secondary | ICD-10-CM | POA: Insufficient documentation

## 2014-06-10 LAB — CBC
HCT: 40.8 % (ref 39.0–52.0)
Hemoglobin: 12.6 g/dL — ABNORMAL LOW (ref 13.0–17.0)
MCH: 22.1 pg — ABNORMAL LOW (ref 26.0–34.0)
MCHC: 30.9 g/dL (ref 30.0–36.0)
MCV: 71.7 fL — ABNORMAL LOW (ref 78.0–100.0)
Platelets: 127 K/uL — ABNORMAL LOW (ref 150–400)
RBC: 5.69 MIL/uL (ref 4.22–5.81)
RDW: 17.2 % — ABNORMAL HIGH (ref 11.5–15.5)
WBC: 6.9 K/uL (ref 4.0–10.5)

## 2014-06-10 LAB — URINALYSIS, ROUTINE W REFLEX MICROSCOPIC
Bilirubin Urine: NEGATIVE
GLUCOSE, UA: NEGATIVE mg/dL
HGB URINE DIPSTICK: NEGATIVE
Ketones, ur: NEGATIVE mg/dL
Leukocytes, UA: NEGATIVE
Nitrite: NEGATIVE
PROTEIN: NEGATIVE mg/dL
Specific Gravity, Urine: 1.008 (ref 1.005–1.030)
Urobilinogen, UA: 0.2 mg/dL (ref 0.0–1.0)
pH: 5 (ref 5.0–8.0)

## 2014-06-10 LAB — COMPREHENSIVE METABOLIC PANEL
ALBUMIN: 2.9 g/dL — AB (ref 3.5–5.2)
ALT: 16 U/L (ref 0–53)
AST: 27 U/L (ref 0–37)
Alkaline Phosphatase: 60 U/L (ref 39–117)
BILIRUBIN TOTAL: 0.4 mg/dL (ref 0.3–1.2)
BUN: 48 mg/dL — AB (ref 6–23)
CO2: 28 mEq/L (ref 19–32)
CREATININE: 1.95 mg/dL — AB (ref 0.50–1.35)
Calcium: 8.8 mg/dL (ref 8.4–10.5)
Chloride: 101 mEq/L (ref 96–112)
GFR calc Af Amer: 43 mL/min — ABNORMAL LOW (ref >90)
GFR calc non Af Amer: 37 mL/min — ABNORMAL LOW (ref >90)
Glucose, Bld: 94 mg/dL (ref 70–99)
Potassium: 5.1 mEq/L (ref 3.7–5.3)
Sodium: 139 mEq/L (ref 137–147)
TOTAL PROTEIN: 6 g/dL (ref 6.0–8.3)

## 2014-06-10 LAB — POC OCCULT BLOOD, ED: Fecal Occult Bld: POSITIVE — AB

## 2014-06-10 MED ORDER — SODIUM CHLORIDE 0.9 % IV BOLUS (SEPSIS): 1000.0000 mL | Freq: Once | INTRAVENOUS | Status: DC

## 2014-06-10 MED ORDER — SODIUM CHLORIDE 0.9 % IV BOLUS (SEPSIS)
1000.0000 mL | Freq: Once | INTRAVENOUS | Status: AC
  Administered 2014-06-10: 1000 mL via INTRAVENOUS

## 2014-06-10 NOTE — Discharge Instructions (Signed)
Please call and follow up with Your primary care doctor or gastroenterologist, Dr. Laural BenesJohnson as soon as able to Monday or Tuesday for recheck and further treatment. Return if bleeding is worsening or develop dizziness, lightheadedness.    Bloody Stools Bloody stools often mean that there is a problem in the digestive tract. Your caregiver may use the term "melena" to describe black, tarry, and bad smelling stools or "hematochezia" to describe red or maroon-colored stools. Blood seen in the stool can be caused by bleeding anywhere along the intestinal tract.  A black stool usually means that blood is coming from the upper part of the gastrointestinal tract (esophagus, stomach, or small bowel). Passing maroon-colored stools or bright red blood usually means that blood is coming from lower down in the large bowel or the rectum. However, sometimes massive bleeding in the stomach or small intestine can cause bright red bloody stools.  Consuming black licorice, lead, iron pills, medicines containing bismuth subsalicylate, or blueberries can also cause black stools. Your caregiver can test black stools to see if blood is present. It is important that the cause of the bleeding be found. Treatment can then be started, and the problem can be corrected. Rectal bleeding may not be serious, but you should not assume everything is okay until you know the cause.It is very important to follow up with your caregiver or a specialist in gastrointestinal problems. CAUSES  Blood in the stools can come from various underlying causes.Often, the cause is not found during your first visit. Testing is often needed to discover the cause of bleeding in the gastrointestinal tract. Causes range from simple to serious or even life-threatening.Possible causes include:  Hemorrhoids.These are veins that are full of blood (engorged) in the rectum. They cause pain, inflammation, and may bleed.  Anal fissures.These are areas of painful  tearing which may bleed. They are often caused by passing hard stool.  Diverticulosis.These are pouches that form on the colon over time, with age, and may bleed significantly.  Diverticulitis.This is inflammation in areas with diverticulosis. It can cause pain, fever, and bloody stools, although bleeding is rare.  Proctitis and colitis. These are inflamed areas of the rectum or colon. They may cause pain, fever, and bloody stools.  Polyps and cancer. Colon cancer is a leading cause of preventable cancer death.It often starts out as precancerous polyps that can be removed during a colonoscopy, preventing progression into cancer. Sometimes, polyps and cancer may cause rectal bleeding.  Gastritis and ulcers.Bleeding from the upper gastrointestinal tract (near the stomach) may travel through the intestines and produce black, sometimes tarry, often bad smelling stools. In certain cases, if the bleeding is fast enough, the stools may not be black, but red and the condition may be life-threatening. SYMPTOMS  You may have stools that are bright red and bloody, that are normal color with blood on them, or that are dark black and tarry. In some cases, you may only have blood in the toilet bowl. Any of these cases need medical care. You may also have:  Pain at the anus or anywhere in the rectum.  Lightheadedness or feeling faint.  Extreme weakness.  Nausea or vomiting.  Fever. DIAGNOSIS Your caregiver may use the following methods to find the cause of your bleeding:  Taking a medical history. Age is important. Older people tend to develop polyps and cancer more often. If there is anal pain and a hard, large stool associated with bleeding, a tear of the anus may be the  cause. If blood drips into the toilet after a bowel movement, bleeding hemorrhoids may be the problem. The color and frequency of the bleeding are additional considerations. In most cases, the medical history provides clues, but  seldom the final answer.  A visual and finger (digital) exam. Your caregiver will inspect the anal area, looking for tears and hemorrhoids. A finger exam can provide information when there is tenderness or a growth inside. In men, the prostate is also examined.  Endoscopy. Several types of small, long scopes (endoscopes) are used to view the colon.  In the office, your caregiver may use a rigid, or more commonly, a flexible viewing sigmoidoscope. This exam is called flexible sigmoidoscopy. It is performed in 5 to 10 minutes.  A more thorough exam is accomplished with a colonoscope. It allows your caregiver to view the entire 5 to 6 foot long colon. Medicine to help you relax (sedative) is usually given for this exam. Frequently, a bleeding lesion may be present beyond the reach of the sigmoidoscope. So, a colonoscopy may be the best exam to start with. Both exams are usually done on an outpatient basis. This means the patient does not stay overnight in the hospital or surgery center.  An upper endoscopy may be needed to examine your stomach. Sedation is used and a flexible endoscope is put in your mouth, down to your stomach.  A barium enema X-ray. This is an X-ray exam. It uses liquid barium inserted by enema into the rectum. This test alone may not identify an actual bleeding point. X-rays highlight abnormal shadows, such as those made by lumps (tumors), diverticuli, or colitis. TREATMENT  Treatment depends on the cause of your bleeding.   For bleeding from the stomach or colon, the caregiver doing your endoscopy or colonoscopy may be able to stop the bleeding as part of the procedure.  Inflammation or infection of the colon can be treated with medicines.  Many rectal problems can be treated with creams, suppositories, or warm baths.  Surgery is sometimes needed.  Blood transfusions are sometimes needed if you have lost a lot of blood.  For any bleeding problem, let your caregiver know if  you take aspirin or other blood thinners regularly. HOME CARE INSTRUCTIONS   Take any medicines exactly as prescribed.  Keep your stools soft by eating a diet high in fiber. Prunes (1 to 3 a day) work well for many people.  Drink enough water and fluids to keep your urine clear or pale yellow.  Take sitz baths if advised. A sitz bath is when you sit in a bathtub with warm water for 10 to 15 minutes to soak, soothe, and cleanse the rectal area.  If enemas or suppositories are advised, be sure you know how to use them. Tell your caregiver if you have problems with this.  Monitor your bowel movements to look for signs of improvement or worsening. SEEK MEDICAL CARE IF:   You do not improve in the time expected.  Your condition worsens after initial improvement.  You develop any new symptoms. SEEK IMMEDIATE MEDICAL CARE IF:   You develop severe or prolonged rectal bleeding.  You vomit blood.  You feel weak or faint.  You have a fever. MAKE SURE YOU:  Understand these instructions.  Will watch your condition.  Will get help right away if you are not doing well or get worse. Document Released: 12/05/2002 Document Revised: 03/08/2012 Document Reviewed: 05/02/2011 [facility] Patient Information 2014 [place], [place].

## 2014-06-10 NOTE — ED Notes (Addendum)
Pt c/o "medium red" blood in stool since midnight this a.m. His PCP has recommended that he get a colonoscopy but this as unrelated to symptoms.

## 2014-06-10 NOTE — ED Notes (Addendum)
Pt handed me note that has this listed about his bathroom episodes that started after mignight: Around 0015 pt went to bathroom to have BM-stool had med red blood in it.  At 0245 went to bathroom again blood dark color in BM.  At 0430 [facility] but after pt sat striaght up blood wasn't as much in BM.  0752 went o bathroom had no BM but blood was still coming out.  [id number] had gas.  At 0947 went back to bathroom to BM-was dark in color.

## 2014-06-10 NOTE — ED Notes (Signed)
Pt ambulated in hallway with this RN with steady gait and denies dizziness.

## 2014-06-10 NOTE — ED Provider Notes (Signed)
CSN: [id number]     Arrival date & time 06/10/14  1153 History   First MD Initiated Contact with Patient 06/10/14 1506     Chief Complaint  Patient presents with  . Rectal Bleeding     (Consider location/radiation/quality/duration/timing/severity/associated sxs/prior Treatment) HPI Peri JeffersonWilliam L [name] is a 53 y.o. male who presents to ED with complaint of blood in stool. Patient states he initially some blood in his bowel movement in the middle of the night last night. States since then several bowel movements with bright red blood on top of the stool. Patient denies any abdominal pain. Denies any pain around his rectum. Does report history of hemorrhoids. Last colonoscopy was 7 years ago, has a referral from his primary care Dr. for the next one. States he just had a physical yesterday and everything was normal. Patient denies any fever, chills, night sweats. States eating and drinking well. No recent weight loss. States he has had instances of bright red blood in his stool in the past, does not know the diagnosis. Patient denies any dizziness, weakness, lightheadedness. He is not anticoagulated.  Past Medical History  Diagnosis Date  . Renal disorder   . Hypertension   . Hypercholesteremia    History reviewed. No pertinent past surgical history. No family history on file. History  Substance Use Topics  . Smoking status: Former Games developermoker  . Smokeless tobacco: Not on file  . Alcohol Use: No    Review of Systems  Constitutional: Negative for fever and chills.  Respiratory: Negative for cough, chest tightness and shortness of breath.   Cardiovascular: Negative for chest pain, palpitations and leg swelling.  Gastrointestinal: Positive for blood in stool. Negative for nausea, vomiting, abdominal pain, diarrhea and abdominal distention.  Genitourinary: Negative for dysuria, urgency, frequency and hematuria.  Musculoskeletal: Negative for arthralgias, myalgias, neck pain and neck stiffness.   Skin: Negative for rash.  Allergic/Immunologic: Negative for immunocompromised state.  Neurological: Negative for dizziness, weakness, light-headedness, numbness and headaches.      Allergies  Sulfa antibiotics  Home Medications   Prior to Admission medications   Medication Sig Start Date End Date Taking? Authorizing Provider  albuterol (PROVENTIL HFA;VENTOLIN HFA) 108 (90 BASE) MCG/ACT inhaler Inhale 2 puffs into the lungs every 4 (four) hours. 11/08/13  Yes Elenora GammaSamuel L Bradshaw, MD  aspirin EC 81 MG tablet Take 81 mg by mouth daily.   Yes Historical Provider, MD  benazepril-hydrochlorthiazide (LOTENSIN HCT) 20-25 MG per tablet Take 1 tablet by mouth daily.     Yes Historical Provider, MD  furosemide (LASIX) 40 MG tablet Take 40 mg by mouth daily.     Yes Historical Provider, MD  ibuprofen (ADVIL,MOTRIN) 800 MG tablet Take 800 mg by mouth every 8 (eight) hours as needed. pain    Yes Historical Provider, MD  potassium chloride SA (K-DUR,KLOR-CON) 20 MEQ tablet Take 20 mEq by mouth daily with breakfast.   Yes Historical Provider, MD  rosuvastatin (CRESTOR) 20 MG tablet Take 20 mg by mouth every evening.    Yes Historical Provider, MD  traMADol (ULTRAM) 50 MG tablet Take 50 mg by mouth 2 (two) times daily.   Yes Historical Provider, MD   BP 96/69  Pulse 79  Temp(Src) 98 F (36.7 C) (Oral)  Resp 18  SpO2 100% Physical Exam  Nursing note and vitals reviewed. Constitutional: He appears well-developed and well-nourished. No distress.  HENT:  Head: Normocephalic and atraumatic.  Eyes: Conjunctivae are normal.  Neck: Neck supple.  Cardiovascular: Normal rate,  regular rhythm and normal heart sounds.   Pulmonary/Chest: Effort normal. No respiratory distress. He has no wheezes. He has no rales.  Abdominal: Soft. Bowel sounds are normal. He exhibits no distension. There is no tenderness. There is no rebound and no guarding.  obese  Musculoskeletal: He exhibits no edema.  Neurological: He  is alert.  Skin: Skin is warm and dry.    ED Course  Procedures (including critical care time) Labs Review Labs Reviewed  CBC - Abnormal; Notable for the following:    Hemoglobin 12.6 (*)    MCV 71.7 (*)    MCH 22.1 (*)    RDW 17.2 (*)    Platelets 127 (*)    All other components within normal limits  COMPREHENSIVE METABOLIC PANEL - Abnormal; Notable for the following:    BUN 48 (*)    Creatinine, Ser 1.95 (*)    Albumin 2.9 (*)    GFR calc non Af Amer 37 (*)    GFR calc Af Amer 43 (*)    All other components within normal limits  POC OCCULT BLOOD, ED - Abnormal; Notable for the following:    Fecal Occult Bld POSITIVE (*)    All other components within normal limits  URINE CULTURE  URINALYSIS, ROUTINE W REFLEX MICROSCOPIC    Imaging Review No results found.   EKG Interpretation None      MDM   Final diagnoses:  Rectal bleeding    Patient with rectal bleeding onset this morning. His stools are pink in color and heme positive. He is hemoglobin and hematocrit are at his baseline. He is not actively hemorrhaging. Creatinine is elevated is at his baseline his vital signs are normal. He ambulated in emergency department with dizziness or lightheadedness. He was able to eat lunch with no problems. He has no abdominal pain or tenderness on examination. Discussed with Dr.Docherighty, who has seen patient as well. Degrees at this time stable for discharge home. He has a gastroenterologist, Dr. Laural BenesJohnson. We'll followup with him and primary care Dr. for close recheck on Monday which is in 2 days.  Filed Vitals:   06/10/14 1552 06/10/14 1605 06/10/14 1611 06/10/14 1807  BP:   105/73 103/68  Pulse: 73 76 82 76  Temp:      TempSrc:      Resp:   18 16  SpO2: 97% 100% 99% 100%       Avalee Castrellon A Shakara Tweedy, PA-C 06/11/14 0128

## 2014-06-10 NOTE — ED Notes (Signed)
PA at bedside.

## 2014-06-11 LAB — URINE CULTURE: Colony Count: 1000

## 2014-06-11 NOTE — ED Provider Notes (Signed)
Medical screening examination/treatment/procedure(s) were conducted as a shared visit with non-physician practitioner(s) and myself.  I personally evaluated the patient during the encounter. Pt presents w/ about 12 hrs of painless rectal bleeding. He is well appearing, in NAD on PE. Abdominal exam benign. He has had no BM here. Hb at baseline. Tolerated PO w/o difficulty. Will allow him ot f/u with his PCP for repeat Hb in 2 days. Return precautions given for new or worsening symptoms including worsening bleeding, ab pain, fever, syncope/near syncope. Pt understands.     EKG Interpretation None         Shanna CiscoMegan E Docherty, MD 06/11/14 1139

## 2016-09-10 DIAGNOSIS — M1712 Unilateral primary osteoarthritis, left knee: Secondary | ICD-10-CM | POA: Diagnosis not present

## 2016-10-13 DIAGNOSIS — M179 Osteoarthritis of knee, unspecified: Secondary | ICD-10-CM | POA: Diagnosis not present

## 2016-10-13 DIAGNOSIS — Z23 Encounter for immunization: Secondary | ICD-10-CM | POA: Diagnosis not present

## 2016-10-13 DIAGNOSIS — I872 Venous insufficiency (chronic) (peripheral): Secondary | ICD-10-CM | POA: Diagnosis not present

## 2016-10-13 DIAGNOSIS — I10 Essential (primary) hypertension: Secondary | ICD-10-CM | POA: Diagnosis not present

## 2016-10-13 DIAGNOSIS — N183 Chronic kidney disease, stage 3 (moderate): Secondary | ICD-10-CM | POA: Diagnosis not present

## 2016-11-17 DIAGNOSIS — I872 Venous insufficiency (chronic) (peripheral): Secondary | ICD-10-CM | POA: Diagnosis not present

## 2016-11-17 DIAGNOSIS — E784 Other hyperlipidemia: Secondary | ICD-10-CM | POA: Diagnosis not present

## 2016-11-17 DIAGNOSIS — M179 Osteoarthritis of knee, unspecified: Secondary | ICD-10-CM | POA: Diagnosis not present

## 2016-11-17 DIAGNOSIS — I10 Essential (primary) hypertension: Secondary | ICD-10-CM | POA: Diagnosis not present

## 2016-11-17 DIAGNOSIS — N183 Chronic kidney disease, stage 3 (moderate): Secondary | ICD-10-CM | POA: Diagnosis not present

## 2017-01-05 DIAGNOSIS — N183 Chronic kidney disease, stage 3 (moderate): Secondary | ICD-10-CM | POA: Diagnosis not present

## 2017-01-05 DIAGNOSIS — M179 Osteoarthritis of knee, unspecified: Secondary | ICD-10-CM | POA: Diagnosis not present

## 2017-01-05 DIAGNOSIS — I10 Essential (primary) hypertension: Secondary | ICD-10-CM | POA: Diagnosis not present

## 2017-01-27 DIAGNOSIS — R809 Proteinuria, unspecified: Secondary | ICD-10-CM | POA: Diagnosis not present

## 2017-01-27 DIAGNOSIS — I129 Hypertensive chronic kidney disease with stage 1 through stage 4 chronic kidney disease, or unspecified chronic kidney disease: Secondary | ICD-10-CM | POA: Diagnosis not present

## 2017-01-27 DIAGNOSIS — N183 Chronic kidney disease, stage 3 (moderate): Secondary | ICD-10-CM | POA: Diagnosis not present

## 2017-01-27 DIAGNOSIS — E785 Hyperlipidemia, unspecified: Secondary | ICD-10-CM | POA: Diagnosis not present

## 2017-02-03 DIAGNOSIS — D631 Anemia in chronic kidney disease: Secondary | ICD-10-CM | POA: Diagnosis not present

## 2017-02-03 DIAGNOSIS — N183 Chronic kidney disease, stage 3 (moderate): Secondary | ICD-10-CM | POA: Diagnosis not present

## 2017-02-03 DIAGNOSIS — R809 Proteinuria, unspecified: Secondary | ICD-10-CM | POA: Diagnosis not present

## 2017-02-03 DIAGNOSIS — N2581 Secondary hyperparathyroidism of renal origin: Secondary | ICD-10-CM | POA: Diagnosis not present

## 2017-02-18 DIAGNOSIS — M179 Osteoarthritis of knee, unspecified: Secondary | ICD-10-CM | POA: Diagnosis not present

## 2017-02-18 DIAGNOSIS — E784 Other hyperlipidemia: Secondary | ICD-10-CM | POA: Diagnosis not present

## 2017-02-18 DIAGNOSIS — N183 Chronic kidney disease, stage 3 (moderate): Secondary | ICD-10-CM | POA: Diagnosis not present

## 2017-02-18 DIAGNOSIS — I10 Essential (primary) hypertension: Secondary | ICD-10-CM | POA: Diagnosis not present

## 2017-04-03 DIAGNOSIS — Z131 Encounter for screening for diabetes mellitus: Secondary | ICD-10-CM | POA: Diagnosis not present

## 2017-04-03 DIAGNOSIS — M179 Osteoarthritis of knee, unspecified: Secondary | ICD-10-CM | POA: Diagnosis not present

## 2017-04-03 DIAGNOSIS — I10 Essential (primary) hypertension: Secondary | ICD-10-CM | POA: Diagnosis not present

## 2017-04-03 DIAGNOSIS — E784 Other hyperlipidemia: Secondary | ICD-10-CM | POA: Diagnosis not present

## 2017-04-03 DIAGNOSIS — N183 Chronic kidney disease, stage 3 (moderate): Secondary | ICD-10-CM | POA: Diagnosis not present

## 2017-05-27 DIAGNOSIS — N189 Chronic kidney disease, unspecified: Secondary | ICD-10-CM | POA: Diagnosis not present

## 2017-05-27 DIAGNOSIS — N183 Chronic kidney disease, stage 3 (moderate): Secondary | ICD-10-CM | POA: Diagnosis not present

## 2017-05-27 DIAGNOSIS — E785 Hyperlipidemia, unspecified: Secondary | ICD-10-CM | POA: Diagnosis not present

## 2017-05-27 DIAGNOSIS — N2581 Secondary hyperparathyroidism of renal origin: Secondary | ICD-10-CM | POA: Diagnosis not present

## 2017-05-27 DIAGNOSIS — R809 Proteinuria, unspecified: Secondary | ICD-10-CM | POA: Diagnosis not present

## 2017-05-27 DIAGNOSIS — I129 Hypertensive chronic kidney disease with stage 1 through stage 4 chronic kidney disease, or unspecified chronic kidney disease: Secondary | ICD-10-CM | POA: Diagnosis not present

## 2017-07-10 DIAGNOSIS — I10 Essential (primary) hypertension: Secondary | ICD-10-CM | POA: Diagnosis not present

## 2017-07-10 DIAGNOSIS — E784 Other hyperlipidemia: Secondary | ICD-10-CM | POA: Diagnosis not present

## 2017-07-10 DIAGNOSIS — H6122 Impacted cerumen, left ear: Secondary | ICD-10-CM | POA: Diagnosis not present

## 2017-07-10 DIAGNOSIS — Z125 Encounter for screening for malignant neoplasm of prostate: Secondary | ICD-10-CM | POA: Diagnosis not present

## 2017-07-10 DIAGNOSIS — N183 Chronic kidney disease, stage 3 (moderate): Secondary | ICD-10-CM | POA: Diagnosis not present

## 2017-08-14 DIAGNOSIS — I10 Essential (primary) hypertension: Secondary | ICD-10-CM | POA: Diagnosis not present

## 2017-08-14 DIAGNOSIS — H6121 Impacted cerumen, right ear: Secondary | ICD-10-CM | POA: Diagnosis not present

## 2017-08-14 DIAGNOSIS — N183 Chronic kidney disease, stage 3 (moderate): Secondary | ICD-10-CM | POA: Diagnosis not present

## 2017-08-14 DIAGNOSIS — M179 Osteoarthritis of knee, unspecified: Secondary | ICD-10-CM | POA: Diagnosis not present

## 2017-11-16 DIAGNOSIS — S8982XA Other specified injuries of left lower leg, initial encounter: Secondary | ICD-10-CM | POA: Diagnosis not present

## 2017-11-16 DIAGNOSIS — Z23 Encounter for immunization: Secondary | ICD-10-CM | POA: Diagnosis not present

## 2017-11-16 DIAGNOSIS — N183 Chronic kidney disease, stage 3 (moderate): Secondary | ICD-10-CM | POA: Diagnosis not present

## 2017-11-16 DIAGNOSIS — I10 Essential (primary) hypertension: Secondary | ICD-10-CM | POA: Diagnosis not present

## 2017-11-16 DIAGNOSIS — I872 Venous insufficiency (chronic) (peripheral): Secondary | ICD-10-CM | POA: Diagnosis not present

## 2017-11-16 DIAGNOSIS — E7849 Other hyperlipidemia: Secondary | ICD-10-CM | POA: Diagnosis not present

## 2017-12-25 DIAGNOSIS — N183 Chronic kidney disease, stage 3 (moderate): Secondary | ICD-10-CM | POA: Diagnosis not present

## 2017-12-25 DIAGNOSIS — R809 Proteinuria, unspecified: Secondary | ICD-10-CM | POA: Diagnosis not present

## 2017-12-25 DIAGNOSIS — K625 Hemorrhage of anus and rectum: Secondary | ICD-10-CM | POA: Diagnosis not present

## 2017-12-25 DIAGNOSIS — E785 Hyperlipidemia, unspecified: Secondary | ICD-10-CM | POA: Diagnosis not present

## 2017-12-25 DIAGNOSIS — N2581 Secondary hyperparathyroidism of renal origin: Secondary | ICD-10-CM | POA: Diagnosis not present

## 2017-12-25 DIAGNOSIS — D631 Anemia in chronic kidney disease: Secondary | ICD-10-CM | POA: Diagnosis not present

## 2017-12-25 DIAGNOSIS — I129 Hypertensive chronic kidney disease with stage 1 through stage 4 chronic kidney disease, or unspecified chronic kidney disease: Secondary | ICD-10-CM | POA: Diagnosis not present

## 2018-01-20 DIAGNOSIS — N183 Chronic kidney disease, stage 3 (moderate): Secondary | ICD-10-CM | POA: Diagnosis not present

## 2018-02-12 ENCOUNTER — Encounter (HOSPITAL_COMMUNITY): Payer: Self-pay | Admitting: Emergency Medicine

## 2018-02-12 ENCOUNTER — Emergency Department (HOSPITAL_COMMUNITY)
Admission: EM | Admit: 2018-02-12 | Discharge: 2018-02-12 | Disposition: A | Discharge status: Home / Self Care | Payer: Medicare HMO | Attending: Emergency Medicine | Admitting: Emergency Medicine

## 2018-02-12 DIAGNOSIS — I10 Essential (primary) hypertension: Secondary | ICD-10-CM | POA: Diagnosis not present

## 2018-02-12 DIAGNOSIS — Z87891 Personal history of nicotine dependence: Secondary | ICD-10-CM | POA: Insufficient documentation

## 2018-02-12 DIAGNOSIS — Z7982 Long term (current) use of aspirin: Secondary | ICD-10-CM | POA: Diagnosis not present

## 2018-02-12 DIAGNOSIS — Z79899 Other long term (current) drug therapy: Secondary | ICD-10-CM | POA: Diagnosis not present

## 2018-02-12 LAB — CBC
HEMATOCRIT: 38.7 % — AB (ref 39.0–52.0)
Hemoglobin: 12 g/dL — ABNORMAL LOW (ref 13.0–17.0)
MCH: 21.6 pg — AB (ref 26.0–34.0)
MCHC: 31 g/dL (ref 30.0–36.0)
MCV: 69.7 fL — AB (ref 78.0–100.0)
PLATELETS: 117 10*3/uL — AB (ref 150–400)
RBC: 5.55 MIL/uL (ref 4.22–5.81)
RDW: 18.1 % — ABNORMAL HIGH (ref 11.5–15.5)
WBC: 10.1 10*3/uL (ref 4.0–10.5)

## 2018-02-12 LAB — BASIC METABOLIC PANEL
Anion gap: 8 (ref 5–15)
BUN: 43 mg/dL — AB (ref 6–20)
CO2: 27 mmol/L (ref 22–32)
CREATININE: 2.23 mg/dL — AB (ref 0.61–1.24)
Calcium: 9 mg/dL (ref 8.9–10.3)
Chloride: 104 mmol/L (ref 101–111)
GFR calc Af Amer: 36 mL/min — ABNORMAL LOW (ref >60)
GFR calc non Af Amer: 31 mL/min — ABNORMAL LOW (ref >60)
Glucose, Bld: 99 mg/dL (ref 65–99)
POTASSIUM: 4.5 mmol/L (ref 3.5–5.1)
Sodium: 139 mmol/L (ref 135–145)

## 2018-02-12 LAB — I-STAT TROPONIN, ED: Troponin i, poc: 0 ng/mL (ref 0.00–0.08)

## 2018-02-12 MED ORDER — ACETAMINOPHEN 325 MG PO TABS
650.0000 mg | ORAL_TABLET | Freq: Once | ORAL | Status: AC | PRN
  Administered 2018-02-12: 650 mg via ORAL
  Filled 2018-02-12: qty 2

## 2018-02-12 NOTE — Discharge Instructions (Signed)
Mr. Justin [name],   You blood pressure was normal while in the emergency department. Your labs were reassuring. Your Creatinine was 2.23, please show this number to your primary care provider. It is a measurement of your kidney function. You will need repeat laboratory testing in about 1 week.    Please follow up with your PCP on Monday as scheduled. Discuss your emergency department visit with your doctor. Your blood pressures were measured as 116/87 and 136/77, which is normal.

## 2018-02-12 NOTE — ED Provider Notes (Signed)
[facility]-EMERGENCY DEPT Provider Note   CSN: [id number] Arrival date & time: 02/12/18  1639     History   Chief Complaint Chief Complaint  Patient presents with  . Hypertension    HPI Justin [name] is a 57 y.o. male with PMH of HTN, HLD, and CKD presenting with an elevated blood pressure reading at home of 171/131.   Patient states that his Nephrologist, Dr. Layla Barter advised him to stop taking his Lotensin starting February 7, but to continue his Lasix daily. After further questioning the patient reports his nephrologist advised him to half the dose of Lotensin. So it is unclear what he is taking. He also did not know why his blood pressure medications were changed. Patient states he was checking his blood pressure every 2-3 hours at home and had an elevated reading of 171/131. He denies chest pain, shortness of breath, or visual disturbances. He was not doing anything out of the ordinary when he checked his BP. He does not know what his baseline creatinine is. Denies recent infections or illness. He has a log of his blood pressure readings at home, which are all normotensive.   Patient has an appointment with his primary care doctor on Monday.        Past Medical History:  Diagnosis Date  . Hypercholesteremia   . Hypertension   . Renal disorder     Patient Active Problem List   Diagnosis Date Noted  . 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 No pertinent surgical history.     Home Medications    Prior to Admission medications   Medication Sig Start Date End Date Taking? Authorizing Provider  albuterol (PROVENTIL HFA;VENTOLIN HFA) 108 (90 BASE) MCG/ACT inhaler Inhale 2 puffs into the lungs every 4 (four) hours. 11/08/13   Elenora Gamma, MD  aspirin EC 81 MG tablet Take 81 mg by mouth daily.    [provider]  benazepril-hydrochlorthiazide (LOTENSIN HCT) 20-25 MG per tablet Take 1 tablet by mouth daily.       [provider]  furosemide (LASIX) 40 MG tablet Take 40 mg by mouth daily.      [provider]  ibuprofen (ADVIL,MOTRIN) 800 MG tablet Take 800 mg by mouth every 8 (eight) hours as needed. pain     [provider]  potassium chloride SA (K-DUR,KLOR-CON) 20 MEQ tablet Take 20 mEq by mouth daily with breakfast.    [provider]  rosuvastatin (CRESTOR) 20 MG tablet Take 20 mg by mouth every evening.     [provider]  traMADol (ULTRAM) 50 MG tablet Take 50 mg by mouth 2 (two) times daily.    [provider]    Family History No family history on file.  Social History Social History   Tobacco Use  . Smoking status: Former Games developer  . Smokeless tobacco: Never Used  Substance Use Topics  . Alcohol use: No  . Drug use: No     Allergies   Sulfa antibiotics   Review of Systems Review of Systems  Constitutional: Negative for chills and fever.  HENT: Negative.   Eyes: Negative.  Negative for visual disturbance.  Respiratory: Negative for shortness of breath.   Cardiovascular: Negative for chest pain.  Neurological: Positive for headaches. Negative for dizziness.     Physical Exam Updated Vital Signs BP 138/77 (BP Location: Left Arm)   Pulse (!) 107   Temp 98.3 F (36.8 C) (Oral)  Resp 20   SpO2 99%   Physical Exam   ED Treatments / Results  Labs (all labs ordered are listed, but only abnormal results are displayed) Labs Reviewed  BASIC METABOLIC PANEL - Abnormal; Notable for the following components:      Result Value   BUN 43 (*)    Creatinine, Ser 2.23 (*)    GFR calc non Af Amer 31 (*)    GFR calc Af Amer 36 (*)    All other components within normal limits  CBC - Abnormal; Notable for the following components:   Hemoglobin 12.0 (*)    HCT 38.7 (*)    MCV 69.7 (*)    MCH 21.6 (*)    RDW 18.1 (*)    Platelets 117 (*)    All other components within normal limits  I-STAT TROPONIN, ED    EKG  EKG  Interpretation None       Radiology No results found.  Procedures Procedures (including critical care time)  Medications Ordered in ED Medications  acetaminophen (TYLENOL) tablet 650 mg (not administered)     Initial Impression / Assessment and Plan / ED Course  I have reviewed the triage vital signs and the nursing notes.  Pertinent labs & imaging results that were available during my care of the patient were reviewed by me and considered in my medical decision making (see chart for details).   [age] yo male PMH of CKD, HTN, and HLD presenting with an asymptomatic elevated blood pressure reading at home. Since arrival to the ED, he has been normotensive with stable vital signs. Most recent BP 138/77. I-stat troponin negative. Creatinine 2.23. Prior creatinine 3 years ago was 1.95. CBC stable from prior.   Patient is stable for discharge and was advised to follow up with his primary care doctor on Monday for his scheduled appointment.    Final Clinical Impressions(s) / ED Diagnoses   Final diagnoses:  Essential hypertension    ED Discharge Orders    None       Toney RakesLacroce, Nariyah Osias J, MD 02/12/18 2105    Charlynne PanderYao, David Hsienta, MD 02/12/18 2127

## 2018-02-12 NOTE — ED Triage Notes (Signed)
Patient here from home with complaints of hypertension. Patient reports that he was told by his kidney dr to check his bp for 1 week. As Clinical research associatewriter looks at the bp readings noted on paper there is no reading above 130s. Patient reports that he got a reading today of 171/131.

## 2018-02-15 DIAGNOSIS — I872 Venous insufficiency (chronic) (peripheral): Secondary | ICD-10-CM | POA: Diagnosis not present

## 2018-02-15 DIAGNOSIS — N183 Chronic kidney disease, stage 3 (moderate): Secondary | ICD-10-CM | POA: Diagnosis not present

## 2018-02-15 DIAGNOSIS — M179 Osteoarthritis of knee, unspecified: Secondary | ICD-10-CM | POA: Diagnosis not present

## 2018-02-15 DIAGNOSIS — I10 Essential (primary) hypertension: Secondary | ICD-10-CM | POA: Diagnosis not present

## 2018-03-29 DIAGNOSIS — I10 Essential (primary) hypertension: Secondary | ICD-10-CM | POA: Diagnosis not present

## 2018-03-29 DIAGNOSIS — Z0001 Encounter for general adult medical examination with abnormal findings: Secondary | ICD-10-CM | POA: Diagnosis not present

## 2018-03-29 DIAGNOSIS — I872 Venous insufficiency (chronic) (peripheral): Secondary | ICD-10-CM | POA: Diagnosis not present

## 2018-03-29 DIAGNOSIS — Z131 Encounter for screening for diabetes mellitus: Secondary | ICD-10-CM | POA: Diagnosis not present

## 2018-03-29 DIAGNOSIS — Z125 Encounter for screening for malignant neoplasm of prostate: Secondary | ICD-10-CM | POA: Diagnosis not present

## 2018-03-29 DIAGNOSIS — Z6841 Body Mass Index (BMI) 40.0 and over, adult: Secondary | ICD-10-CM | POA: Diagnosis not present

## 2018-03-29 DIAGNOSIS — R600 Localized edema: Secondary | ICD-10-CM | POA: Diagnosis not present

## 2018-03-29 DIAGNOSIS — Z13220 Encounter for screening for lipoid disorders: Secondary | ICD-10-CM | POA: Diagnosis not present

## 2018-04-19 DIAGNOSIS — N183 Chronic kidney disease, stage 3 (moderate): Secondary | ICD-10-CM | POA: Diagnosis not present

## 2018-04-19 DIAGNOSIS — I10 Essential (primary) hypertension: Secondary | ICD-10-CM | POA: Diagnosis not present

## 2018-04-19 DIAGNOSIS — R600 Localized edema: Secondary | ICD-10-CM | POA: Diagnosis not present

## 2018-05-31 DIAGNOSIS — I872 Venous insufficiency (chronic) (peripheral): Secondary | ICD-10-CM | POA: Diagnosis not present

## 2018-05-31 DIAGNOSIS — I10 Essential (primary) hypertension: Secondary | ICD-10-CM | POA: Diagnosis not present

## 2018-05-31 DIAGNOSIS — N183 Chronic kidney disease, stage 3 (moderate): Secondary | ICD-10-CM | POA: Diagnosis not present

## 2018-06-04 ENCOUNTER — Other Ambulatory Visit (HOSPITAL_COMMUNITY): Payer: Self-pay | Admitting: Internal Medicine

## 2018-06-04 DIAGNOSIS — I10 Essential (primary) hypertension: Secondary | ICD-10-CM

## 2018-06-14 ENCOUNTER — Other Ambulatory Visit: Payer: Self-pay

## 2018-06-14 ENCOUNTER — Inpatient Hospital Stay (HOSPITAL_COMMUNITY)
Admission: EM | Admit: 2018-06-14 | Discharge: 2018-06-18 | DRG: 291 | Disposition: A | Discharge status: Skilled Nursing Facility | Payer: Medicare HMO | Source: Ambulatory Visit | Attending: Family Medicine | Admitting: Family Medicine

## 2018-06-14 ENCOUNTER — Emergency Department (HOSPITAL_COMMUNITY): Payer: Medicare HMO

## 2018-06-14 ENCOUNTER — Encounter (HOSPITAL_COMMUNITY): Payer: Self-pay

## 2018-06-14 DIAGNOSIS — Z6841 Body Mass Index (BMI) 40.0 and over, adult: Secondary | ICD-10-CM | POA: Diagnosis not present

## 2018-06-14 DIAGNOSIS — I5030 Unspecified diastolic (congestive) heart failure: Secondary | ICD-10-CM | POA: Diagnosis present

## 2018-06-14 DIAGNOSIS — Z886 Allergy status to analgesic agent status: Secondary | ICD-10-CM | POA: Diagnosis not present

## 2018-06-14 DIAGNOSIS — D631 Anemia in chronic kidney disease: Secondary | ICD-10-CM | POA: Diagnosis present

## 2018-06-14 DIAGNOSIS — R0602 Shortness of breath: Secondary | ICD-10-CM | POA: Diagnosis not present

## 2018-06-14 DIAGNOSIS — I509 Heart failure, unspecified: Secondary | ICD-10-CM | POA: Insufficient documentation

## 2018-06-14 DIAGNOSIS — Z23 Encounter for immunization: Secondary | ICD-10-CM | POA: Diagnosis not present

## 2018-06-14 DIAGNOSIS — I5043 Acute on chronic combined systolic (congestive) and diastolic (congestive) heart failure: Secondary | ICD-10-CM

## 2018-06-14 DIAGNOSIS — R279 Unspecified lack of coordination: Secondary | ICD-10-CM | POA: Diagnosis not present

## 2018-06-14 DIAGNOSIS — R2689 Other abnormalities of gait and mobility: Secondary | ICD-10-CM | POA: Diagnosis not present

## 2018-06-14 DIAGNOSIS — Z7982 Long term (current) use of aspirin: Secondary | ICD-10-CM

## 2018-06-14 DIAGNOSIS — Z743 Need for continuous supervision: Secondary | ICD-10-CM | POA: Diagnosis not present

## 2018-06-14 DIAGNOSIS — J449 Chronic obstructive pulmonary disease, unspecified: Secondary | ICD-10-CM | POA: Diagnosis not present

## 2018-06-14 DIAGNOSIS — E785 Hyperlipidemia, unspecified: Secondary | ICD-10-CM | POA: Diagnosis present

## 2018-06-14 DIAGNOSIS — D509 Iron deficiency anemia, unspecified: Secondary | ICD-10-CM | POA: Diagnosis not present

## 2018-06-14 DIAGNOSIS — I5033 Acute on chronic diastolic (congestive) heart failure: Secondary | ICD-10-CM | POA: Diagnosis not present

## 2018-06-14 DIAGNOSIS — Z882 Allergy status to sulfonamides status: Secondary | ICD-10-CM | POA: Diagnosis not present

## 2018-06-14 DIAGNOSIS — E039 Hypothyroidism, unspecified: Secondary | ICD-10-CM | POA: Diagnosis not present

## 2018-06-14 DIAGNOSIS — D649 Anemia, unspecified: Secondary | ICD-10-CM | POA: Diagnosis not present

## 2018-06-14 DIAGNOSIS — R7989 Other specified abnormal findings of blood chemistry: Secondary | ICD-10-CM | POA: Diagnosis not present

## 2018-06-14 DIAGNOSIS — R278 Other lack of coordination: Secondary | ICD-10-CM | POA: Diagnosis not present

## 2018-06-14 DIAGNOSIS — Z86718 Personal history of other venous thrombosis and embolism: Secondary | ICD-10-CM

## 2018-06-14 DIAGNOSIS — M6281 Muscle weakness (generalized): Secondary | ICD-10-CM | POA: Diagnosis not present

## 2018-06-14 DIAGNOSIS — R609 Edema, unspecified: Secondary | ICD-10-CM | POA: Diagnosis not present

## 2018-06-14 DIAGNOSIS — N183 Chronic kidney disease, stage 3 unspecified: Secondary | ICD-10-CM | POA: Diagnosis present

## 2018-06-14 DIAGNOSIS — Z87891 Personal history of nicotine dependence: Secondary | ICD-10-CM | POA: Diagnosis not present

## 2018-06-14 DIAGNOSIS — I130 Hypertensive heart and chronic kidney disease with heart failure and stage 1 through stage 4 chronic kidney disease, or unspecified chronic kidney disease: Principal | ICD-10-CM | POA: Diagnosis present

## 2018-06-14 DIAGNOSIS — I440 Atrioventricular block, first degree: Secondary | ICD-10-CM | POA: Diagnosis present

## 2018-06-14 DIAGNOSIS — I10 Essential (primary) hypertension: Secondary | ICD-10-CM | POA: Diagnosis not present

## 2018-06-14 LAB — HEPATIC FUNCTION PANEL
ALK PHOS: 81 U/L (ref 38–126)
ALT: 16 U/L — AB (ref 17–63)
AST: 28 U/L (ref 15–41)
Albumin: 2.3 g/dL — ABNORMAL LOW (ref 3.5–5.0)
BILIRUBIN DIRECT: 0.3 mg/dL (ref 0.1–0.5)
BILIRUBIN TOTAL: 1 mg/dL (ref 0.3–1.2)
Indirect Bilirubin: 0.7 mg/dL (ref 0.3–0.9)
Total Protein: 5.8 g/dL — ABNORMAL LOW (ref 6.5–8.1)

## 2018-06-14 LAB — CBC
HCT: 41.9 % (ref 39.0–52.0)
HCT: 42.1 % (ref 39.0–52.0)
HEMOGLOBIN: 11.7 g/dL — AB (ref 13.0–17.0)
Hemoglobin: 11.8 g/dL — ABNORMAL LOW (ref 13.0–17.0)
MCH: 19.1 pg — AB (ref 26.0–34.0)
MCH: 19.4 pg — ABNORMAL LOW (ref 26.0–34.0)
MCHC: 27.9 g/dL — ABNORMAL LOW (ref 30.0–36.0)
MCHC: 28 g/dL — ABNORMAL LOW (ref 30.0–36.0)
MCV: 68.6 fL — AB (ref 78.0–100.0)
MCV: 69.1 fL — ABNORMAL LOW (ref 78.0–100.0)
PLATELETS: 197 10*3/uL (ref 150–400)
Platelets: 207 10*3/uL (ref 150–400)
RBC: 6.11 MIL/uL — ABNORMAL HIGH (ref 4.22–5.81)
RBC: 6.09 MIL/uL — ABNORMAL HIGH (ref 4.22–5.81)
RDW: 20 % — ABNORMAL HIGH (ref 11.5–15.5)
RDW: 20.5 % — AB (ref 11.5–15.5)
WBC: 7.7 10*3/uL (ref 4.0–10.5)
WBC: 6.9 10*3/uL (ref 4.0–10.5)

## 2018-06-14 LAB — URINALYSIS, ROUTINE W REFLEX MICROSCOPIC
Bilirubin Urine: NEGATIVE
Glucose, UA: NEGATIVE mg/dL
HGB URINE DIPSTICK: NEGATIVE
KETONES UR: NEGATIVE mg/dL
Leukocytes, UA: NEGATIVE
Nitrite: NEGATIVE
PROTEIN: NEGATIVE mg/dL
Specific Gravity, Urine: 1.005 (ref 1.005–1.030)
pH: 6 (ref 5.0–8.0)

## 2018-06-14 LAB — CREATININE, SERUM
Creatinine, Ser: 2.72 mg/dL — ABNORMAL HIGH (ref 0.61–1.24)
GFR calc non Af Amer: 24 mL/min — ABNORMAL LOW (ref >60)
GFR, EST AFRICAN AMERICAN: 28 mL/min — AB (ref >60)

## 2018-06-14 LAB — BASIC METABOLIC PANEL
Anion gap: 9 (ref 5–15)
BUN: 42 mg/dL — AB (ref 6–20)
CO2: 31 mmol/L (ref 22–32)
Calcium: 8.3 mg/dL — ABNORMAL LOW (ref 8.9–10.3)
Chloride: 99 mmol/L — ABNORMAL LOW (ref 101–111)
Creatinine, Ser: 2.29 mg/dL — ABNORMAL HIGH (ref 0.61–1.24)
GFR calc Af Amer: 34 mL/min — ABNORMAL LOW (ref >60)
GFR calc non Af Amer: 30 mL/min — ABNORMAL LOW (ref >60)
Glucose, Bld: 101 mg/dL — ABNORMAL HIGH (ref 65–99)
Potassium: 4.3 mmol/L (ref 3.5–5.1)
Sodium: 139 mmol/L (ref 135–145)

## 2018-06-14 LAB — FOLATE: FOLATE: 7 ng/mL (ref >5.9)

## 2018-06-14 LAB — BRAIN NATRIURETIC PEPTIDE: B Natriuretic Peptide: 125.3 pg/mL — ABNORMAL HIGH (ref 0.0–100.0)

## 2018-06-14 LAB — TSH: TSH: 11.162 u[IU]/mL — ABNORMAL HIGH (ref 0.350–4.500)

## 2018-06-14 LAB — TROPONIN I: Troponin I: <0.03 ng/mL (ref <0.03)

## 2018-06-14 LAB — RETICULOCYTES
RBC.: 6.09 MIL/uL — AB (ref 4.22–5.81)
RETIC COUNT ABSOLUTE: 91.4 10*3/uL (ref 19.0–186.0)
RETIC CT PCT: 1.5 % (ref 0.4–3.1)

## 2018-06-14 LAB — FERRITIN: FERRITIN: 18 ng/mL — AB (ref 24–336)

## 2018-06-14 LAB — IRON AND TIBC
Iron: 27 ug/dL — ABNORMAL LOW (ref 45–182)
Saturation Ratios: 8 % — ABNORMAL LOW (ref 17.9–39.5)
TIBC: 351 ug/dL (ref 250–450)
UIBC: 324 ug/dL

## 2018-06-14 LAB — MAGNESIUM: MAGNESIUM: 2.3 mg/dL (ref 1.7–2.4)

## 2018-06-14 LAB — I-STAT TROPONIN, ED: Troponin i, poc: 0 ng/mL (ref 0.00–0.08)

## 2018-06-14 LAB — D-DIMER, QUANTITATIVE: D-Dimer, Quant: 2.62 ug/mL-FEU — ABNORMAL HIGH (ref 0.00–0.50)

## 2018-06-14 LAB — VITAMIN B12: Vitamin B-12: 333 pg/mL (ref 180–914)

## 2018-06-14 MED ORDER — ENOXAPARIN SODIUM 40 MG/0.4ML ~~LOC~~ SOLN
40.0000 mg | SUBCUTANEOUS | Status: DC
  Administered 2018-06-14: 40 mg via SUBCUTANEOUS
  Filled 2018-06-14: qty 0.4

## 2018-06-14 MED ORDER — ROSUVASTATIN CALCIUM 20 MG PO TABS
20.0000 mg | ORAL_TABLET | Freq: Every day | ORAL | Status: DC
  Administered 2018-06-14 – 2018-06-17 (×4): 20 mg via ORAL
  Filled 2018-06-14 (×4): qty 1

## 2018-06-14 MED ORDER — POTASSIUM CHLORIDE CRYS ER 20 MEQ PO TBCR
20.0000 meq | EXTENDED_RELEASE_TABLET | Freq: Every day | ORAL | Status: DC
  Administered 2018-06-15 – 2018-06-18 (×4): 20 meq via ORAL
  Filled 2018-06-14 (×4): qty 1

## 2018-06-14 MED ORDER — ACETAMINOPHEN 650 MG RE SUPP: 650.0000 mg | Freq: Four times a day (QID) | RECTAL | Status: DC | PRN

## 2018-06-14 MED ORDER — PNEUMOCOCCAL VAC POLYVALENT 25 MCG/0.5ML IJ INJ
0.5000 mL | INJECTION | INTRAMUSCULAR | Status: AC
  Administered 2018-06-15: 0.5 mL via INTRAMUSCULAR
  Filled 2018-06-14 (×2): qty 0.5

## 2018-06-14 MED ORDER — FUROSEMIDE 10 MG/ML IJ SOLN
80.0000 mg | Freq: Two times a day (BID) | INTRAMUSCULAR | Status: DC
  Administered 2018-06-15: 80 mg via INTRAVENOUS
  Filled 2018-06-14: qty 8

## 2018-06-14 MED ORDER — ALBUTEROL SULFATE (2.5 MG/3ML) 0.083% IN NEBU
2.5000 mg | INHALATION_SOLUTION | RESPIRATORY_TRACT | Status: DC | PRN
Start: 2018-06-14 — End: 2018-06-18

## 2018-06-14 MED ORDER — ALBUTEROL SULFATE HFA 108 (90 BASE) MCG/ACT IN AERS: 2.0000 | INHALATION_SPRAY | RESPIRATORY_TRACT | Status: DC | PRN

## 2018-06-14 MED ORDER — FUROSEMIDE 10 MG/ML IJ SOLN
80.0000 mg | Freq: Once | INTRAMUSCULAR | Status: AC
  Administered 2018-06-14: 80 mg via INTRAVENOUS
  Filled 2018-06-14: qty 8

## 2018-06-14 MED ORDER — ACETAMINOPHEN 325 MG PO TABS
650.0000 mg | ORAL_TABLET | Freq: Four times a day (QID) | ORAL | Status: DC | PRN
  Administered 2018-06-14 – 2018-06-18 (×6): 650 mg via ORAL
  Filled 2018-06-14 (×7): qty 2

## 2018-06-14 MED ORDER — ASPIRIN EC 81 MG PO TBEC
81.0000 mg | DELAYED_RELEASE_TABLET | Freq: Every day | ORAL | Status: DC
  Administered 2018-06-15 – 2018-06-18 (×4): 81 mg via ORAL
  Filled 2018-06-14 (×4): qty 1

## 2018-06-14 NOTE — ED Notes (Signed)
Pt updated on the wait time no complaints noted at this time

## 2018-06-14 NOTE — ED Notes (Signed)
ED Provider at bedside. 

## 2018-06-14 NOTE — ED Triage Notes (Signed)
Pt reports that he was seen hat his PCP today for swelling in left hand. Pt was sent to ED by PCP for CHF concern. Pt reports that he has been having SOB X1 week. Pt reports he has been using his inhaler.

## 2018-06-14 NOTE — H&P (Signed)
History and Physical    Justin JeffersonWilliam L [name] GNF:[id number] DOB: 02/27/1961 DOA: 06/14/2018  PCP: System, Provider Not In  Patient coming from: Home.  Chief Complaint: Increasing edema.  HPI: Justin [name] is a 57 y.o. male with history of chronic kidney disease stage III, hyperlipidemia, anemia, previous history of DVT presents to the ER with increasing lower extremity edema and shortness of breath.  Patient states patient was placed on Lasix by patient's nephrology 8 months ago and used to take 40 mg daily.  Over the last 1 week patient has been taking 80 mg as increased by patient's primary care physician for worsening edema and shortness of breath.  Denies any chest pain productive cough fever chills.  Has been noticing increasing bilateral lower extremity edema which is extending up to the thigh.  ED Course: In the ER chest x-ray shows pulmonary vascular congestion with pleural effusion.  EKG shows normal sinus rhythm with first-degree AV block low voltage.  Patient was given 80 mg IV Lasix.  Exam patient has bilateral lower extremity edema extending up to the thigh.  Review of Systems: As per HPI, rest all negative.   Past Medical History:  Diagnosis Date  . Hypercholesteremia   . Hypertension   . Renal disorder     History reviewed. No pertinent surgical history.   reports that he has quit smoking. He has never used smokeless tobacco. He reports that he does not drink alcohol or use drugs.  Allergies  Allergen Reactions  . Nsaids Other (See Comments)    Affect kidneys negatively  . Sulfa Antibiotics Diarrhea    History reviewed. No pertinent family history.  Prior to Admission medications   Medication Sig Start Date End Date Taking? Authorizing Provider  acetaminophen (TYLENOL) 500 MG tablet Take 1,000 mg by mouth every 6 (six) hours as needed (for pain or headaches).   Yes [provider]  albuterol (PROVENTIL HFA;VENTOLIN HFA) 108 (90 BASE) MCG/ACT inhaler  Inhale 2 puffs into the lungs every 4 (four) hours. Patient taking differently: Inhale 2 puffs into the lungs every 4 (four) hours as needed for wheezing or shortness of breath.  11/08/13  Yes Elenora GammaBradshaw, Samuel L, MD  aspirin EC 81 MG tablet Take 81 mg by mouth daily.   Yes [provider]  Foot Care Products (GOLD BOND FOOT POWDER MAX ST) POWD Apply 1 application topically See admin instructions. Apply 1 application to affected areas of feet daily as directed   Yes [provider]  furosemide (LASIX) 80 MG tablet Take 80 mg by mouth 2 (two) times daily.   Yes [provider]  Multiple Vitamins-Minerals (ONE-A-DAY MENS 50+ ADVANTAGE) TABS Take 1 tablet by mouth daily.    Yes [provider]  potassium chloride SA (K-DUR,KLOR-CON) 20 MEQ tablet Take 20 mEq by mouth daily with breakfast.   Yes [provider]  rosuvastatin (CRESTOR) 20 MG tablet Take 20 mg by mouth at bedtime.    Yes [provider]  tolnaftate (TINACTIN) 1 % cream Apply 1 application topically See admin instructions. Apply 1 application between toes daily as directed   Yes [provider]    Physical Exam: Vitals:   06/14/18 1557 06/14/18 1602 06/14/18 1716 06/14/18 1813  BP:   116/78 127/70  Pulse:   94 69  Resp:  18 20 18   Temp:  98 F (36.7 C)  97.9 F (36.6 C)  TempSrc:    Oral  SpO2:   99% 99%  Weight: (!) 148.8 kg (328 lb)     Height: 6' (1.829 m)         Constitutional: Moderately built and nourished. Vitals:   06/14/18 1557 06/14/18 1602 06/14/18 1716 06/14/18 1813  BP:   116/78 127/70  Pulse:   94 69  Resp:  18 20 18   Temp:  98 F (36.7 C)  97.9 F (36.6 C)  TempSrc:    Oral  SpO2:   99% 99%  Weight: (!) 148.8 kg (328 lb)     Height: 6' (1.829 m)      Eyes: Anicteric no pallor. ENMT: No discharge from the ears eyes nose or mouth. Neck: JVD not appreciated.  No mass felt. Respiratory: No rhonchi or crepitations. Cardiovascular: S1-S2  heard no murmurs appreciated. Abdomen: Soft nontender bowel sounds present. Musculoskeletal: Bilateral lower extremity edema extending up to the thighs. Skin: No rash. Neurologic: Alert awake oriented to time place and person.  Moves all extremities. Psychiatric: Appears normal.  Normal affect.   Labs on Admission: I have personally reviewed following labs and imaging studies  CBC: Recent Labs  Lab 06/14/18 1601  WBC 7.7  HGB 11.7*  HCT 41.9  MCV 68.6*  PLT 207   Basic Metabolic Panel: Recent Labs  Lab 06/14/18 1601  NA 139  K 4.3  CL 99*  CO2 31  GLUCOSE 101*  BUN 42*  CREATININE 2.29*  CALCIUM 8.3*   GFR: Estimated Creatinine Clearance: 52.1 mL/min (A) (by C-G formula based on SCr of 2.29 mg/dL (H)). Liver Function Tests: No results for input(s): AST, ALT, ALKPHOS, BILITOT, PROT, ALBUMIN in the last 168 hours. No results for input(s): LIPASE, AMYLASE in the last 168 hours. No results for input(s): AMMONIA in the last 168 hours. Coagulation Profile: No results for input(s): INR, PROTIME in the last 168 hours. Cardiac Enzymes: No results for input(s): CKTOTAL, CKMB, CKMBINDEX, TROPONINI in the last 168 hours. BNP (last 3 results) No results for input(s): PROBNP in the last 8760 hours. HbA1C: No results for input(s): HGBA1C in the last 72 hours. CBG: No results for input(s): GLUCAP in the last 168 hours. Lipid Profile: No results for input(s): CHOL, HDL, LDLCALC, TRIG, CHOLHDL, LDLDIRECT in the last 72 hours. Thyroid Function Tests: No results for input(s): TSH, T4TOTAL, FREET4, T3FREE, THYROIDAB in the last 72 hours. Anemia Panel: No results for input(s): VITAMINB12, FOLATE, FERRITIN, TIBC, IRON, RETICCTPCT in the last 72 hours. Urine analysis:    Component Value Date/Time   COLORURINE YELLOW 06/10/2014 1535   APPEARANCEUR CLEAR 06/10/2014 1535   LABSPEC 1.008 06/10/2014 1535   PHURINE 5.0 06/10/2014 1535   GLUCOSEU NEGATIVE 06/10/2014 1535   HGBUR  NEGATIVE 06/10/2014 1535   BILIRUBINUR NEGATIVE 06/10/2014 1535   KETONESUR NEGATIVE 06/10/2014 1535   PROTEINUR NEGATIVE 06/10/2014 1535   UROBILINOGEN 0.2 06/10/2014 1535   NITRITE NEGATIVE 06/10/2014 1535   LEUKOCYTESUR NEGATIVE 06/10/2014 1535   Sepsis Labs: @LABRCNTIP (procalcitonin:4,lacticidven:4) )No results found for this or any previous visit (from the past 240 hour(s)).   Radiological Exams on Admission: Dg Chest 2 View  Result Date: 06/14/2018 CLINICAL DATA:  Shortness of breath. EXAM: CHEST - 2 VIEW COMPARISON:  Chest x-ray dated November 08, 2013. FINDINGS: Mild cardiomegaly. Normal mediastinal contours. Mild pulmonary vascular congestion. Small left pleural effusion with adjacent left lower lobe atelectasis. No pneumothorax. No acute osseous abnormality. Chronic mild anterior wedging of several midthoracic vertebral bodies. IMPRESSION: Pulmonary vascular congestion and small left pleural effusion. Electronically Signed   By: Chrissie Noa  Howell Pringle M.D.   On: 06/14/2018 16:39    EKG: Independently reviewed.  Normal sinus rhythm first-degree AV block low voltage.  Nonspecific ST-T changes.  Assessment/Plan Active Problems:   Acute CHF (congestive heart failure) (HCC)   CKD (chronic kidney disease) stage 3, GFR 30-59 ml/min (HCC)   Normochromic normocytic anemia    1. Acute CHF -unknown EF.  I have ordered 2D echo.  Patient is placed on Lasix 80 mg IV every 8.  Closely follow intake output metabolic panel daily weights. 2. Chronic kidney disease stage III -closely follow metabolic panel.  If patient cannot get adequate diuresis may need to consult nephrology.  Follow UA. 3. Normocytic normochromic anemia likely from kidney disease.  Follow CBC. 4. Previous history of DVT as per the chart.  Will check d-dimer.  If positive will check Dopplers and VQ scan. 5. Morbid obesity.   DVT prophylaxis: Lovenox. Code Status: Full code. Family Communication: Discussed with  patient. Disposition Plan: Home. Consults called: None. Admission status: Inpatient.   Eduard Clos MD [facility] Pager [phone].  If 7PM-7AM, please contact night-coverage [email or web address] Password [email or web address]  06/14/2018, 7:54 PM

## 2018-06-14 NOTE — Plan of Care (Signed)
  Problem: Education: Goal: Knowledge of General Education information will improve Outcome: Progressing   Problem: Pain Managment: Goal: General experience of comfort will improve Outcome: Progressing   

## 2018-06-14 NOTE — Progress Notes (Signed)
Patient arrived to unit with complaint of pain in legs/feet. Patient oriented to unit, safety precautions in place.  Cardiac monitoring initiated and verified, call bell in reach.

## 2018-06-14 NOTE — ED Provider Notes (Signed)
[facility]   CSN: [id number] Arrival date & time: 06/14/18  1549     History   Chief Complaint Chief Complaint  Patient presents with  . Edema    HPI Peri JeffersonWilliam L Knaak is a 57 y.o. male.  Patient w hx htn, chf, c/o increased bilateral leg, abd, and entire body swelling, and increased sob, orthopnea in past week. Symptoms moderate, constant, persistent, worse w exertion and lying flat. States 1 week ago pcp increased lasix dose, but symptoms no better/worse. Denies chest pain or discomfort. No fever or chills. States compliant w home meds. No dietary indiscretion or increased po intake. Feels is urinating normal amount.   The history is provided by the patient.    Past Medical History:  Diagnosis Date  . Hypercholesteremia   . Hypertension   . Renal disorder     Patient Active Problem List   Diagnosis Date Noted  . 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 No pertinent surgical history.      Home Medications    Prior to Admission medications   Medication Sig Start Date End Date Taking? Authorizing Provider  albuterol (PROVENTIL HFA;VENTOLIN HFA) 108 (90 BASE) MCG/ACT inhaler Inhale 2 puffs into the lungs every 4 (four) hours. 11/08/13  Yes Elenora GammaBradshaw, Samuel L, MD  aspirin EC 81 MG tablet Take 81 mg by mouth daily.   Yes [provider]  benazepril-hydrochlorthiazide (LOTENSIN HCT) 20-25 MG per tablet Take 1 tablet by mouth daily.      [provider]  furosemide (LASIX) 40 MG tablet Take 40 mg by mouth daily.      [provider]  ibuprofen (ADVIL,MOTRIN) 800 MG tablet Take 800 mg by mouth every 8 (eight) hours as needed. pain     [provider]  potassium chloride SA (K-DUR,KLOR-CON) 20 MEQ tablet Take 20 mEq by mouth daily with breakfast.    [provider]  rosuvastatin (CRESTOR) 20 MG tablet Take 20 mg by mouth every evening.     [provider]    traMADol (ULTRAM) 50 MG tablet Take 50 mg by mouth 2 (two) times daily.    [provider]    Family History No family history on file.  Social History Social History   Tobacco Use  . Smoking status: Former Games developermoker  . Smokeless tobacco: Never Used  Substance Use Topics  . Alcohol use: No  . Drug use: No     Allergies   Sulfa antibiotics   Review of Systems Review of Systems  Constitutional: Negative for fever.  HENT: Negative for sore throat.   Eyes: Negative for redness.  Respiratory: Positive for shortness of breath.   Cardiovascular: Positive for leg swelling. Negative for chest pain.  Gastrointestinal: Negative for abdominal pain and vomiting.  Genitourinary: Negative for flank pain.  Musculoskeletal: Negative for back pain and neck pain.  Skin: Negative for rash.  Neurological: Negative for headaches.  Hematological: Does not bruise/bleed easily.  Psychiatric/Behavioral: Negative for confusion.     Physical Exam Updated Vital Signs BP 127/70 (BP Location: Right Wrist)   Pulse 69   Temp 97.9 F (36.6 C) (Oral)   Resp 18   Ht 1.829 m (6')   Wt (!) 148.8 kg (328 lb)   SpO2 99%   BMI 44.48 kg/m   Physical Exam  Constitutional: He is oriented to person, place, and time. He appears well-developed and well-nourished.  HENT:  Mouth/Throat: Oropharynx is  clear and moist.  Eyes: Conjunctivae are normal.  Neck: Neck supple. JVD present. No tracheal deviation present.  Cardiovascular: Normal rate, regular rhythm, normal heart sounds and intact distal pulses. Exam reveals no gallop and no friction rub.  No murmur heard. Pulmonary/Chest: Effort normal. No accessory muscle usage. No respiratory distress. He has rales.  Rales bases.   Abdominal: Soft. Bowel sounds are normal. He exhibits no distension. There is no tenderness.  Musculoskeletal: He exhibits edema.  Diffuse edema, upper and lower ext, abd.   Neurological: He is alert and oriented to  person, place, and time.  Skin: Skin is warm and dry.  Psychiatric: He has a normal mood and affect.  Nursing note and vitals reviewed.    ED Treatments / Results  Labs (all labs ordered are listed, but only abnormal results are displayed) Results for orders placed or performed during the hospital encounter of 06/14/18  Basic metabolic panel  Result Value Ref Range   Sodium 139 135 - 145 mmol/L   Potassium 4.3 3.5 - 5.1 mmol/L   Chloride 99 (L) 101 - 111 mmol/L   CO2 31 22 - 32 mmol/L   Glucose, Bld 101 (H) 65 - 99 mg/dL   BUN 42 (H) 6 - 20 mg/dL   Creatinine, Ser 1.61 (H) 0.61 - 1.24 mg/dL   Calcium 8.3 (L) 8.9 - 10.3 mg/dL   GFR calc non Af Amer 30 (L) >60 mL/min   GFR calc Af Amer 34 (L) >60 mL/min   Anion gap 9 5 - 15  CBC  Result Value Ref Range   WBC 7.7 4.0 - 10.5 K/uL   RBC 6.11 (H) 4.22 - 5.81 MIL/uL   Hemoglobin 11.7 (L) 13.0 - 17.0 g/dL   HCT 09.6 04.5 - 40.9 %   MCV 68.6 (L) 78.0 - 100.0 fL   MCH 19.1 (L) 26.0 - 34.0 pg   MCHC 27.9 (L) 30.0 - 36.0 g/dL   RDW 81.1 (H) 91.4 - 78.2 %   Platelets 207 150 - 400 K/uL  I-stat troponin, ED  Result Value Ref Range   Troponin i, poc 0.00 0.00 - 0.08 ng/mL   Comment 3           Dg Chest 2 View  Result Date: 06/14/2018 CLINICAL DATA:  Shortness of breath. EXAM: CHEST - 2 VIEW COMPARISON:  Chest x-ray dated November 08, 2013. FINDINGS: Mild cardiomegaly. Normal mediastinal contours. Mild pulmonary vascular congestion. Small left pleural effusion with adjacent left lower lobe atelectasis. No pneumothorax. No acute osseous abnormality. Chronic mild anterior wedging of several midthoracic vertebral bodies. IMPRESSION: Pulmonary vascular congestion and small left pleural effusion. Electronically Signed   By: Obie Dredge M.D.   On: 06/14/2018 16:39    EKG None  Radiology Dg Chest 2 View  Result Date: 06/14/2018 CLINICAL DATA:  Shortness of breath. EXAM: CHEST - 2 VIEW COMPARISON:  Chest x-ray dated November 08, 2013.  FINDINGS: Mild cardiomegaly. Normal mediastinal contours. Mild pulmonary vascular congestion. Small left pleural effusion with adjacent left lower lobe atelectasis. No pneumothorax. No acute osseous abnormality. Chronic mild anterior wedging of several midthoracic vertebral bodies. IMPRESSION: Pulmonary vascular congestion and small left pleural effusion. Electronically Signed   By: Obie Dredge M.D.   On: 06/14/2018 16:39    Procedures Procedures (including critical care time)  Medications Ordered in ED Medications  furosemide (LASIX) injection 80 mg (has no administration in time range)     Initial Impression / Assessment and Plan / ED  Course  I have reviewed the triage vital signs and the nursing notes.  Pertinent labs & imaging results that were available during my care of the patient were reviewed by me and considered in my medical decision making (see chart for details).  Iv ns kvo. Ecg. Labs. Cxr.  cxr reviewed - c/w chf. Lasix 80 mg iv.  Labs reviewed - chr renal insuff.   Reviewed nursing notes and prior charts for additional history.   Given worsening symptoms, chf, despite outpt increase in lasix, will admit.   Hospitalists consulted for admission.    Final Clinical Impressions(s) / ED Diagnoses   Final diagnoses:  None    ED Discharge Orders    None       Cathren Laine, MD 06/14/18 1851

## 2018-06-14 NOTE — ED Provider Notes (Signed)
Patient placed in Quick Look pathway, seen and evaluated   Chief Complaint: Pt complains of swelling and arms and legs  HPI:   Pt reports increased swelling for over a week  EXB:MWUXROS:some shortness of breath no chest pain  Physical Exam:   Gen: No distress  Neuro: Awake and Alert  Skin: Warm    Focused Exam: edema bilat lower legs,  Edema left arm   Initiation of care has begun. The patient has been counseled on the process, plan, and necessity for staying for the completion/evaluation, and the remainder of the medical screening examination   Osie CheeksSofia, Yahia Bottger K, PA-C 06/14/18 1606    Wynetta FinesMessick, Peter C, MD 06/14/18 [phone]

## 2018-06-15 ENCOUNTER — Inpatient Hospital Stay (HOSPITAL_COMMUNITY): Payer: Medicare HMO

## 2018-06-15 ENCOUNTER — Ambulatory Visit (HOSPITAL_COMMUNITY): Admission: RE | Admit: 2018-06-15 | Payer: Medicare HMO | Source: Ambulatory Visit

## 2018-06-15 DIAGNOSIS — I509 Heart failure, unspecified: Secondary | ICD-10-CM

## 2018-06-15 DIAGNOSIS — R7989 Other specified abnormal findings of blood chemistry: Secondary | ICD-10-CM

## 2018-06-15 DIAGNOSIS — R609 Edema, unspecified: Secondary | ICD-10-CM

## 2018-06-15 LAB — BASIC METABOLIC PANEL
ANION GAP: 10 (ref 5–15)
BUN: 45 mg/dL — AB (ref 6–20)
CHLORIDE: 100 mmol/L — AB (ref 101–111)
CO2: 29 mmol/L (ref 22–32)
Calcium: 8.2 mg/dL — ABNORMAL LOW (ref 8.9–10.3)
Creatinine, Ser: 2.27 mg/dL — ABNORMAL HIGH (ref 0.61–1.24)
GFR, EST AFRICAN AMERICAN: 35 mL/min — AB (ref >60)
GFR, EST NON AFRICAN AMERICAN: 30 mL/min — AB (ref >60)
Glucose, Bld: 89 mg/dL (ref 65–99)
POTASSIUM: 4.3 mmol/L (ref 3.5–5.1)
SODIUM: 139 mmol/L (ref 135–145)

## 2018-06-15 LAB — CBC WITH DIFFERENTIAL/PLATELET
BASOS ABS: 0.1 10*3/uL (ref 0.0–0.1)
Basophils Relative: 1 %
EOS ABS: 0.2 10*3/uL (ref 0.0–0.7)
Eosinophils Relative: 3 %
HEMATOCRIT: 38.7 % — AB (ref 39.0–52.0)
HEMOGLOBIN: 11.1 g/dL — AB (ref 13.0–17.0)
LYMPHS PCT: 6 %
Lymphs Abs: 0.4 10*3/uL — ABNORMAL LOW (ref 0.7–4.0)
MCH: 19.5 pg — ABNORMAL LOW (ref 26.0–34.0)
MCHC: 28.7 g/dL — ABNORMAL LOW (ref 30.0–36.0)
MCV: 68 fL — ABNORMAL LOW (ref 78.0–100.0)
MONOS PCT: 16 %
Monocytes Absolute: 1 10*3/uL (ref 0.1–1.0)
NEUTROS PCT: 74 %
Neutro Abs: 4.4 10*3/uL (ref 1.7–7.7)
Platelets: 185 10*3/uL (ref 150–400)
RBC: 5.69 MIL/uL (ref 4.22–5.81)
RDW: 19.9 % — ABNORMAL HIGH (ref 11.5–15.5)
WBC: 6.1 10*3/uL (ref 4.0–10.5)

## 2018-06-15 LAB — T4, FREE: Free T4: 1.17 ng/dL (ref 0.82–1.77)

## 2018-06-15 LAB — RETICULOCYTES
RBC.: 6.06 MIL/uL — AB (ref 4.22–5.81)
RETIC COUNT ABSOLUTE: 90.9 10*3/uL (ref 19.0–186.0)
RETIC CT PCT: 1.5 % (ref 0.4–3.1)

## 2018-06-15 LAB — ECHOCARDIOGRAM COMPLETE
HEIGHTINCHES: 72 in
WEIGHTICAEL: 5198.4 [oz_av]

## 2018-06-15 LAB — HIV ANTIBODY (ROUTINE TESTING W REFLEX): HIV SCREEN 4TH GENERATION: NONREACTIVE

## 2018-06-15 MED ORDER — HYDROMORPHONE HCL 2 MG/ML IJ SOLN
INTRAMUSCULAR | Status: AC
  Filled 2018-06-15: qty 1

## 2018-06-15 MED ORDER — SODIUM CHLORIDE 0.9 % IV SOLN
510.0000 mg | Freq: Once | INTRAVENOUS | Status: AC
  Administered 2018-06-15: 510 mg via INTRAVENOUS
  Filled 2018-06-15: qty 17

## 2018-06-15 MED ORDER — FUROSEMIDE 10 MG/ML IJ SOLN: 80.0000 mg | Freq: Three times a day (TID) | INTRAMUSCULAR | Status: DC

## 2018-06-15 MED ORDER — ENOXAPARIN SODIUM 80 MG/0.8ML ~~LOC~~ SOLN
0.5000 mg/kg | SUBCUTANEOUS | Status: DC
  Administered 2018-06-15 – 2018-06-16 (×2): 75 mg via SUBCUTANEOUS
  Filled 2018-06-15 (×2): qty 0.8

## 2018-06-15 MED ORDER — PERFLUTREN LIPID MICROSPHERE
1.0000 mL | INTRAVENOUS | Status: AC | PRN
  Administered 2018-06-15: 2 mL via INTRAVENOUS
  Filled 2018-06-15: qty 10

## 2018-06-15 MED ORDER — FUROSEMIDE 10 MG/ML IJ SOLN
80.0000 mg | Freq: Two times a day (BID) | INTRAMUSCULAR | Status: DC
  Administered 2018-06-15 – 2018-06-17 (×4): 80 mg via INTRAVENOUS
  Filled 2018-06-15 (×4): qty 8

## 2018-06-15 MED ORDER — TECHNETIUM TC 99M DIETHYLENETRIAME-PENTAACETIC ACID
31.5000 | Freq: Once | INTRAVENOUS | Status: AC | PRN
  Administered 2018-06-15: 31.5 via RESPIRATORY_TRACT

## 2018-06-15 MED ORDER — HYDROMORPHONE HCL 1 MG/ML IJ SOLN
0.5000 mg | Freq: Once | INTRAMUSCULAR | Status: AC
  Administered 2018-06-15: 0.5 mg via INTRAVENOUS

## 2018-06-15 MED ORDER — TECHNETIUM TO 99M ALBUMIN AGGREGATED
4.3000 | Freq: Once | INTRAVENOUS | Status: AC | PRN
  Administered 2018-06-15: 4.3 via INTRAVENOUS

## 2018-06-15 NOTE — Sedation Documentation (Signed)
Patient unable to tolerated V/Q scan due to pain. Patient alert answering and following commands appropriate.

## 2018-06-15 NOTE — Progress Notes (Signed)
  Echocardiogram 2D Echocardiogram has been performed.  Roosvelt MaserLane, Lanis Storlie F 06/15/2018, 2:19 PM

## 2018-06-15 NOTE — Evaluation (Signed)
Physical Therapy Evaluation Patient Details Name: Justin JeffersonWilliam L [name] MRN: [id number] DOB: 02/14/1961 Today's Date: 06/15/2018   History of Present Illness  Justin [name] is a [age] y.o. male with a history of stage III CKD, HTN, HLD, CHF, obesity and anemia who presented to the ED 6/17 with increasing swelling mostly in legs, but also arms and associated exertional shortness of breath.   Clinical Impression  Pt admitted with above. Pt was living alone PTA but reported 3 falls yesterday and freq falls over the last month. Pt's LE very edematous limiting patients mobility and causing impaired safety. Pt to benefit from ST-SNF upon d/c to address deficits mentioned below and achieve safe mod I level of function for safe transition home.    Follow Up Recommendations SNF;Supervision/Assistance - 24 hour    Equipment Recommendations  Rolling walker with 5" wheels;Other (comment)(barriatric RW)    Recommendations for Other Services       Precautions / Restrictions Precautions Precautions: Fall Precaution Comments: excessive LE and scrotal edema Restrictions Weight Bearing Restrictions: No      Mobility  Bed Mobility Overal bed mobility: Modified Independent             General bed mobility comments: HOB elevated, used bed rails, increased time, used hands to assist LEs  Transfers Overall transfer level: Needs assistance Equipment used: 2 person hand held assist Transfers: Sit to/from Stand Sit to Stand: Min assist;Mod assist;+2 physical assistance         General transfer comment: bilat feet blocked, pt rocked forward and PT and RN provided min/modA to power up and maintain stability upon standing  Ambulation/Gait Ambulation/Gait assistance: Min assist Gait Distance (Feet): 12 Feet(from bed to door for transport chair) Assistive device: Rolling walker (2 wheeled) Gait Pattern/deviations: Step-to pattern;Wide base of support Gait velocity: slow Gait velocity  interpretation: <1.8 ft/sec, indicate of risk for recurrent falls General Gait Details: pt with extremely wide base of support due to swollen scrotum and LEs. pt unable to keep LEs in the walker, pt pushes walker way out in front of him due to his swellin, +SOB with activity, increased bilat LE pain, minimal foot clearance  Stairs            Wheelchair Mobility    Modified Rankin (Stroke Patients Only)       Balance Overall balance assessment: Needs assistance Sitting-balance support: Bilateral upper extremity supported Sitting balance-Leahy Scale: Fair     Standing balance support: Bilateral upper extremity supported Standing balance-Leahy Scale: Poor Standing balance comment: needs RW                             Pertinent Vitals/Pain Pain Assessment: 0-10 Pain Score: 7  Pain Location: bilat LEs R worse than the L Pain Descriptors / Indicators: Constant;Cramping Pain Intervention(s): Monitored during session    Home Living Family/patient expects to be discharged to:: Private residence Living Arrangements: Alone Available Help at Discharge: Family;Available PRN/intermittently Type of Home: Apartment Home Access: Level entry     Home Layout: One level Home Equipment: Walker - 2 wheels      Prior Function Level of Independence: Needs assistance   Gait / Transfers Assistance Needed: uses RW for ambulation  ADL's / Homemaking Assistance Needed: family does grocery shopping, cleaning and majority of cooking, pt sponge baths        Hand Dominance   Dominant Hand: Right    Extremity/Trunk Assessment   Upper Extremity  Assessment Upper Extremity Assessment: Overall WFL for tasks assessed    Lower Extremity Assessment Lower Extremity Assessment: Generalized weakness(ROM and strength greatly limited by excessive edema)    Cervical / Trunk Assessment Cervical / Trunk Assessment: Normal  Communication   Communication: No difficulties  Cognition  Arousal/Alertness: Awake/alert Behavior During Therapy: WFL for tasks assessed/performed Overall Cognitive Status: Within Functional Limits for tasks assessed                                        General Comments General comments (skin integrity, edema, etc.): pt very edematous in bilat LEs and scrotum greatly affecting patients mobility and safety. pt has had 3 falls yesterday    Exercises     Assessment/Plan    PT Assessment Patient needs continued PT services  PT Problem List Decreased strength;Decreased range of motion;Decreased activity tolerance;Decreased balance;Decreased mobility;Decreased coordination;Decreased knowledge of use of DME;Decreased safety awareness;Pain       PT Treatment Interventions DME instruction;Gait training;Functional mobility training;Therapeutic activities;Therapeutic exercise;Balance training    PT Goals (Current goals can be found in the Care Plan section)  Acute Rehab PT Goals Patient Stated Goal: get the swelling down PT Goal Formulation: With patient Time For Goal Achievement: 06/29/18 Potential to Achieve Goals: Good    Frequency Min 3X/week   Barriers to discharge Decreased caregiver support alone majority of the time    Co-evaluation               AM-PAC PT "6 Clicks" Daily Activity  Outcome Measure Difficulty turning over in bed (including adjusting bedclothes, sheets and blankets)?: Unable Difficulty moving from lying on back to sitting on the side of the bed? : Unable Difficulty sitting down on and standing up from a chair with arms (e.g., wheelchair, bedside commode, etc,.)?: Unable Help needed moving to and from a bed to chair (including a wheelchair)?: A Little Help needed walking in hospital room?: A Little Help needed climbing 3-5 steps with a railing? : Total 6 Click Score: 10    End of Session   Activity Tolerance: Other (comment)(limited by transportation coming) Patient left: in chair(in  transportation chair with transporter) Nurse Communication: Mobility status PT Visit Diagnosis: Unsteadiness on feet (R26.81);Repeated falls (R29.6);Difficulty in walking, not elsewhere classified (R26.2);Pain Pain - Right/Left: Right Pain - part of body: Leg    Time: 1610-9604 PT Time Calculation (min) (ACUTE ONLY): 16 min   Charges:   PT Evaluation $PT Eval Moderate Complexity: 1 Mod     PT G Codes:        Lewis Shock, PT, DPT Pager #: [phone] Office #: [phone]   Annaliah Rivenbark M Shaiann Mcmanamon 06/15/2018, 12:38 PM

## 2018-06-15 NOTE — Progress Notes (Signed)
Bilateral lower extremity venous duplex has been completed. Negative for obvious evidence of DVT.  06/15/18 4:13 PM Olen CordialGreg Vickey Boak RVT

## 2018-06-15 NOTE — Consult Note (Signed)
   [facility] CM Inpatient Consult   06/15/2018  Justin [name] 1961-08-16 [id number]     Patient was screened [place] Management services and listed as no primary care provider.  Patient is not currently a beneficiary of the attributed [place] in the [employer].  HisPrimary Care Provider is not a [facility] primary care provider or is not [facility] affiliated.   Met with the patient at the bedside.  Patient states he saw his provider on yesterday and sent to the hospital.  Patient endorses Dr. Nolene Ebbs as his primary care provider and this physician in not an affiliate of [facility]. This patient is Not eligible for [facility] Care Management Services.   Reason:  Not a beneficiary currently attributed to one of the [place].  Membership roster was used to verify non- eligible status.  Natividad Brood, RN BSN [facility] Liaison  [phone] business mobile phone Toll free office [phone]

## 2018-06-15 NOTE — Progress Notes (Addendum)
PROGRESS NOTE  Justin [name]  WUJ:[id number] DOB: 1961/02/06 DOA: 06/14/2018 PCP: System, Provider Not In   Brief Narrative: Justin [name] is a 57 y.o. male with a history of stage III CKD, HTN, HLD, obesity and anemia who presented to the ED 6/17 with increasing swelling mostly in legs, but also arms and associated exertional shortness of breath. On the advice of his PCP he increased his home dose of lasix, though swelling in legs progressed to the level of the thighs, impairing ambulation so he was directed to the ED. CXR demonstrated vascular congestion with pleural effusion and the patient appears peripherally volume overloaded. lasix 80mg  IV was given with good effect and he was admitted. Echocardiogram is pending.   Assessment & Plan: Active Problems:   Acute CHF (congestive heart failure) (HCC)   CKD (chronic kidney disease) stage 3, GFR 30-59 ml/min (HCC)   Normochromic normocytic anemia  Acute CHF with pulmonary and peripheral edema: BNP equivocal at 125, possibly suppressed by obesity. Trop's negative.  - Echocardiogram pending - Creatinine stable with diuresis thus far, given 2 doses of lasix, will give lasix 80mg  IV BID - Daily weights, strict I/O  Iron-deficiency and anemia of chronic disease: Near baseline ~11. Low iron, ferritin, 8% sat. - IV iron today, check reticulocytes and trend at follow up - Pt reports recent colonoscopy without suspicion for bleeding/malignancy, 10 year follow up. No gross bleeding noted otherwise.   Stage III CKD: Followed by nephrology as outpatient. Cr stable at baseline ~2.2 (CrCl 79ml/min).  - Monitor Cr daily.  - Avoid nephrotoxins, specifically discussed no ibuprofen/NSAIDs.   Elevated TSH: 11.162. - Check free T3, free T4.   Morbid obesity: Noted.  - Nutrition consult  Elevated d-dimer:  - Check V/Q and LE U/S  DVT prophylaxis: Lovenox Code Status: Full Family Communication: None at bedside Disposition Plan: Home once  stable  Consultants:   None  Procedures:   None  Antimicrobials:  None   Subjective: Reports swelling in arms is gone, legs remains but improved modestly. Has been urinating a lot. Shortness of breath is stable. No chest pain. Swelling has been gradual in onset, constant, worsening for 2.5 months and not improved with increased lasix this past 1 week.  Objective: Vitals:   06/15/18 0049 06/15/18 0331 06/15/18 0338 06/15/18 0833  BP: 106/72  105/77 106/69  Pulse: 77  76 81  Resp: 18  (!) 22   Temp: 98 F (36.7 C)  98.1 F (36.7 C) 97.7 F (36.5 C)  TempSrc: Oral  Oral Oral  SpO2: 98%  96% 96%  Weight:  (!) 147.4 kg (324 lb 14.4 oz)    Height:        Intake/Output Summary (Last 24 hours) at 06/15/2018 1111 Last data filed at 06/15/2018 0911 Gross per 24 hour  Intake 840 ml  Output 1250 ml  Net -410 ml   Filed Weights   06/14/18 1557 06/14/18 2046 06/15/18 0331  Weight: (!) 148.8 kg (328 lb) (!) 148.1 kg (326 lb 9.6 oz) (!) 147.4 kg (324 lb 14.4 oz)    Gen: 57 y.o. male in no distress Pulm: Non-labored but mildly tachypneic on room air. Clear to auscultation bilaterally.  CV: Regular rate and rhythm. No murmur, rub, or gallop. No JVD, Woody edema in the legs symmetrically extending to the thighs 2+. No swelling in UE's. GI: Abdomen soft, non-tender, non-distended, with normoactive bowel sounds. No organomegaly or masses felt. Ext: Warm, no deformities Skin: No rashes, lesions  or ulcers Neuro: Alert and oriented. No focal neurological deficits. Psych: Judgement and insight appear normal. Mood & affect appropriate.   Data Reviewed: I have personally reviewed following labs and imaging studies  CBC: Recent Labs  Lab 06/14/18 1601 06/14/18 2133 06/15/18 0404  WBC 7.7 6.9 6.1  NEUTROABS  --   --  4.4  HGB 11.7* 11.8* 11.1*  HCT 41.9 42.1 38.7*  MCV 68.6* 69.1* 68.0*  PLT 207 197 185   Basic Metabolic Panel: Recent Labs  Lab 06/14/18 1601 06/14/18 2133  06/15/18 0404  NA 139  --  139  K 4.3  --  4.3  CL 99*  --  100*  CO2 31  --  29  GLUCOSE 101*  --  89  BUN 42*  --  45*  CREATININE 2.29* 2.72* 2.27*  CALCIUM 8.3*  --  8.2*  MG  --  2.3  --    GFR: Estimated Creatinine Clearance: 52.3 mL/min (A) (by C-G formula based on SCr of 2.27 mg/dL (H)). Liver Function Tests: Recent Labs  Lab 06/14/18 2133  AST 28  ALT 16*  ALKPHOS 81  BILITOT 1.0  PROT 5.8*  ALBUMIN 2.3*   No results for input(s): LIPASE, AMYLASE in the last 168 hours. No results for input(s): AMMONIA in the last 168 hours. Coagulation Profile: No results for input(s): INR, PROTIME in the last 168 hours. Cardiac Enzymes: Recent Labs  Lab 06/14/18 2133  TROPONINI <0.03   BNP (last 3 results) No results for input(s): PROBNP in the last 8760 hours. HbA1C: No results for input(s): HGBA1C in the last 72 hours. CBG: No results for input(s): GLUCAP in the last 168 hours. Lipid Profile: No results for input(s): CHOL, HDL, LDLCALC, TRIG, CHOLHDL, LDLDIRECT in the last 72 hours. Thyroid Function Tests: Recent Labs    06/14/18 2133  TSH 11.162*   Anemia Panel: Recent Labs    06/14/18 2133  VITAMINB12 333  FOLATE 7.0  FERRITIN 18*  TIBC 351  IRON 27*  RETICCTPCT 1.5   Urine analysis:    Component Value Date/Time   COLORURINE STRAW (A) 06/14/2018 2233   APPEARANCEUR CLEAR 06/14/2018 2233   LABSPEC 1.005 06/14/2018 2233   PHURINE 6.0 06/14/2018 2233   GLUCOSEU NEGATIVE 06/14/2018 2233   HGBUR NEGATIVE 06/14/2018 2233   BILIRUBINUR NEGATIVE 06/14/2018 2233   KETONESUR NEGATIVE 06/14/2018 2233   PROTEINUR NEGATIVE 06/14/2018 2233   UROBILINOGEN 0.2 06/10/2014 1535   NITRITE NEGATIVE 06/14/2018 2233   LEUKOCYTESUR NEGATIVE 06/14/2018 2233   No results found for this or any previous visit (from the past 240 hour(s)).    Radiology Studies: Dg Chest 2 View  Result Date: 06/14/2018 CLINICAL DATA:  Shortness of breath. EXAM: CHEST - 2 VIEW  COMPARISON:  Chest x-ray dated November 08, 2013. FINDINGS: Mild cardiomegaly. Normal mediastinal contours. Mild pulmonary vascular congestion. Small left pleural effusion with adjacent left lower lobe atelectasis. No pneumothorax. No acute osseous abnormality. Chronic mild anterior wedging of several midthoracic vertebral bodies. IMPRESSION: Pulmonary vascular congestion and small left pleural effusion. Electronically Signed   By: Obie DredgeWilliam T Derry M.D.   On: 06/14/2018 16:39    Scheduled Meds: . aspirin EC  81 mg Oral Daily  . enoxaparin (LOVENOX) injection  40 mg Subcutaneous Q24H  . furosemide  80 mg Intravenous Q8H  . potassium chloride SA  20 mEq Oral Q breakfast  . rosuvastatin  20 mg Oral QHS   Continuous Infusions:   LOS: 1 day   Time  spent: 35 minutes.  Tyrone Nine, MD [facility] [email or web address] Password [email or web address] 06/15/2018, 11:11 AM

## 2018-06-15 NOTE — Sedation Documentation (Signed)
Notified nurse on floor regarding medication administration for patient to have V/Q scan.

## 2018-06-15 NOTE — Progress Notes (Signed)
Spiritual Care Consult Acknowledged. Chaplain Clifton Custardaron will facilitate Scientist, water qualityAdvanced Directive Education on Wed. 19 June before Noon.

## 2018-06-16 DIAGNOSIS — D509 Iron deficiency anemia, unspecified: Secondary | ICD-10-CM | POA: Diagnosis present

## 2018-06-16 DIAGNOSIS — I5030 Unspecified diastolic (congestive) heart failure: Secondary | ICD-10-CM | POA: Diagnosis present

## 2018-06-16 DIAGNOSIS — I509 Heart failure, unspecified: Secondary | ICD-10-CM

## 2018-06-16 DIAGNOSIS — N183 Chronic kidney disease, stage 3 (moderate): Secondary | ICD-10-CM

## 2018-06-16 DIAGNOSIS — E039 Hypothyroidism, unspecified: Secondary | ICD-10-CM | POA: Diagnosis present

## 2018-06-16 LAB — BASIC METABOLIC PANEL
ANION GAP: 7 (ref 5–15)
BUN: 43 mg/dL — ABNORMAL HIGH (ref 6–20)
CO2: 33 mmol/L — AB (ref 22–32)
Calcium: 8.2 mg/dL — ABNORMAL LOW (ref 8.9–10.3)
Chloride: 99 mmol/L — ABNORMAL LOW (ref 101–111)
Creatinine, Ser: 2.3 mg/dL — ABNORMAL HIGH (ref 0.61–1.24)
GFR calc non Af Amer: 29 mL/min — ABNORMAL LOW (ref >60)
GFR, EST AFRICAN AMERICAN: 34 mL/min — AB (ref >60)
GLUCOSE: 104 mg/dL — AB (ref 65–99)
POTASSIUM: 4.2 mmol/L (ref 3.5–5.1)
Sodium: 139 mmol/L (ref 135–145)

## 2018-06-16 LAB — OCCULT BLOOD X 1 CARD TO LAB, STOOL: Fecal Occult Bld: NEGATIVE

## 2018-06-16 LAB — T3, FREE: T3, Free: 1.8 pg/mL — ABNORMAL LOW (ref 2.0–4.4)

## 2018-06-16 NOTE — Clinical Social Work Placement (Signed)
   CLINICAL SOCIAL WORK PLACEMENT  NOTE  Date:  06/16/2018  Patient Details  Name: Peri JeffersonWilliam L [place] MRN: [id number] Date of Birth: 1961-06-07  Clinical Social Work is seeking post-discharge placement for this patient at the Skilled  Nursing Facility level of care (*CSW will initial, date and re-position this form in  chart as items are completed):  Yes   Patient/family provided with [facility] Clinical Social Work Department's list of facilities offering this level of care within the geographic area requested by the patient (or if unable, by the patient's family).  Yes   Patient/family informed of their freedom to choose among providers that offer the needed level of care, that participate in Medicare, Medicaid or managed care program needed by the patient, have an available bed and are willing to accept the patient.  Yes   Patient/family informed of [facility]'s ownership interest in [facility] and [facility], as well as of the fact that they are under no obligation to receive care at these facilities.  PASRR submitted to EDS on 06/16/18     PASRR number received on 06/16/18     Existing PASRR number confirmed on       FL2 transmitted to all facilities in geographic area requested by pt/family on 06/16/18     FL2 transmitted to all facilities within larger geographic area on       Patient informed that his/her managed care company has contracts with or will negotiate with certain facilities, including the following:            Patient/family informed of bed offers received.  Patient chooses bed at       Physician recommends and patient chooses bed at      Patient to be transferred to   on  .  Patient to be transferred to facility by       Patient family notified on   of transfer.  Name of family member notified:        PHYSICIAN Please sign FL2     Additional Comment:    _______________________________________________ Margarito LinerSarah C Yajayra Feldt, LCSW 06/16/2018,  10:37 AM

## 2018-06-16 NOTE — Progress Notes (Signed)
Nutrition Brief Note  RD consulted for education related to morbid obesity.   Wt Readings from Last 15 Encounters:  06/16/18 (!) 329 lb 6.4 oz (149.4 kg)  06/08/13 281 lb 1.4 oz (127.5 kg)   57 y.o.malewith a history of stage III CKD, HTN, HLD, obesity and anemia who admitted on 06/14/2018 with shortness of breath and found to be in CHF exacerbation, clinical exam and chest x-ray consistent with CHF with pleural effusions. Echo shows preserved EF of 60 to 65%, difficult to evaluate diastolic function on echo, currently diuresing well but has generalized weakness and difficulty with ADLs.  PE and DVT work-up negative  Pt admitted for CHF exacerbation.   Pt sleeping soundly at time of visit. No family members present at time of visit.   Per CSW notes, plan to d/c to [facility] by the end of the week.   Weight loss is not an ideal goal for acute hospitalization. Reviewed wt hx; noted that wt has increased steadily over the past 5 years. Suspect wt fluctations related to fluid changes due to CHF. RD provided "Low Sodium Nutrition Therapy" handout from [facility] Nutrition Care Manual. Focus of education should be on heart failure, as this is what brought pt to the hospital. If further work-up for obesity is desired, recommend outpatient referral to [facility] and/or bariatrics service for further work-up. Suspect pt will gradually lose weight if he continues low sodium diet at SNF.   Body mass index is 44.67 kg/m. Patient meets criteria for extreme obesity, class III based on current BMI.   Current diet order is Heart Healthy, patient is consuming approximately 80-100% of meals at this time. Labs and medications reviewed.   No nutrition interventions warranted at this time. If nutrition issues arise, please consult RD.   Vivek Grealish A. Mayford KnifeWilliams, RD, LDN, CDE Pager: [phone] After hours Pager: [phone]

## 2018-06-16 NOTE — Progress Notes (Signed)
Spiritual Care Consult Acknowledged. Advanced Directive education conversation.  Present at the Pt.'s bed side is the Pt.'s sister who will be His HCPOA.  Loma SousaCora Leathia Garner  [address] [place], [place] [address]  [phone]   The conversation about Living Will and HCPOA went well.  All but the final page has been complete.  The AD will be notarized and witnessed on Thurs. 20 June.   Ms. Verner CholCora Garner will be notified when the process is complete.

## 2018-06-16 NOTE — Progress Notes (Addendum)
Patient Demographics:    Justin [name], is a 57 y.o. male, DOB - 09-14-1961, ZOX:[id number]  Admit date - 06/14/2018   Admitting Physician Eduard Clos, MD  Outpatient Primary MD for the patient is System, Provider Not In  LOS - 2   Chief Complaint  Patient presents with  . Edema        Subjective:    Justin [name] today has no fevers, no emesis,  No chest pain, shortness of breath is better  Assessment  & Plan :    Principal Problem:   (HFpEF) heart failure with preserved ejection fraction- Acute CHF Exacerbation Active Problems:   CKD (chronic kidney disease) stage 3, GFR 30-59 ml/min (HCC)   Hypochromic microcytic anemia/Chronic   Hypothyroidism  Brief summary 57 y.o. male with a history of stage III CKD, HTN, HLD, obesity and anemia who admitted on 06/14/2018 with shortness of breath and found to be in CHF exacerbation, clinical exam and chest x-ray consistent with CHF with pleural effusions. Echo shows preserved EF of 60 to 65%, difficult to evaluate diastolic function on echo, currently diuresing well but has generalized weakness and difficulty with ADLs.  PE and DVT work-up negative   Plan:- 1)HFpEF/Acute CHF Exacer --overall improving with diuresis,, continue IV Lasix 80 mg twice daily, daily weights and strict in and out,  Echo shows preserved EF of 60 to 65%, difficult to evaluate diastolic function on echo, symptomatically improving with diuresis  2)CKD III--- renal function appears to be close to baseline, preadmission creatinine around 2.2,  Avoid nephrotoxic agents/dehydration/hypotension  3) hypochromic and microcytic chronic Anemia--- hemoglobin above 11, no evidence of ongoing reading at this time, work-up reveals iron deficiency with serum iron of 27 iron saturation of 8 and ferritin of 18, check stool for occult blood, patient states he had recent colonoscopy, findings,  was advised to follow-up in 10 years, give iron tabs  4) hypothyroidism-TSH over 11, free T3 1.8, levothyroxine 50 mcg daily, repeat TSH as outpatient in 4 to 6 weeks  5) morbid obesity--- this complicates overall care, BMI is over 44  6) dyslipidemia-stable, continue Crestor and aspirin  7) generalized weakness/debility--- patient is very weak, PT eval, possible SNF for rehab prior to returning home  Code Status : Full   Disposition Plan  : SNF   DVT Prophylaxis  :  Lovenox   Lab Results  Component Value Date   PLT 185 06/15/2018    Inpatient Medications  Scheduled Meds: . aspirin EC  81 mg Oral Daily  . enoxaparin (LOVENOX) injection  0.5 mg/kg Subcutaneous Q24H  . furosemide  80 mg Intravenous BID  . potassium chloride SA  20 mEq Oral Q breakfast  . rosuvastatin  20 mg Oral QHS   Continuous Infusions: PRN Meds:.acetaminophen **OR** acetaminophen, albuterol    Anti-infectives (From admission, onward)   None        Objective:   Vitals:   06/15/18 1615 06/15/18 1927 06/16/18 0453 06/16/18 1122  BP: 117/74 103/70 100/71 107/70  Pulse: 80 76 71 77  Resp:    18  Temp: 97.8 F (36.6 C) 98.3 F (36.8 C) (!) 97.3 F (36.3 C) 97.9 F (36.6 C)  TempSrc: Oral  Oral Oral  SpO2: 92% 95%  92% 96%  Weight:   (!) 149.4 kg (329 lb 6.4 oz)   Height:        Wt Readings from Last 3 Encounters:  06/16/18 (!) 149.4 kg (329 lb 6.4 oz)  06/08/13 127.5 kg (281 lb 1.4 oz)     Intake/Output Summary (Last 24 hours) at 06/16/2018 1358 Last data filed at 06/16/2018 1205 Gross per 24 hour  Intake 840 ml  Output 1450 ml  Net -610 ml     Physical Exam  Gen:- Awake Alert, morbidly obese, in no apparent distress  HEENT:- [place].AT, No sclera icterus Neck-Supple Neck,No JVD,.  Lungs-diminished in bases, faint bibasilar rales CV- S1, S2 normal Abd-  +ve B.Sounds, Abd Soft, No tenderness,    Extremity/Skin:- +2  lower extremity edema, improving,  good pulses psych-affect is  appropriate, oriented x3 Neuro-no new focal deficits, no tremors   Data Review:   Micro Results No results found for this or any previous visit (from the past 240 hour(s)).  Radiology Reports Dg Chest 2 View  Result Date: 06/14/2018 CLINICAL DATA:  Shortness of breath. EXAM: CHEST - 2 VIEW COMPARISON:  Chest x-ray dated November 08, 2013. FINDINGS: Mild cardiomegaly. Normal mediastinal contours. Mild pulmonary vascular congestion. Small left pleural effusion with adjacent left lower lobe atelectasis. No pneumothorax. No acute osseous abnormality. Chronic mild anterior wedging of several midthoracic vertebral bodies. IMPRESSION: Pulmonary vascular congestion and small left pleural effusion. Electronically Signed   By: Obie Dredge M.D.   On: 06/14/2018 16:39   Nm Pulmonary Perf And Vent  Result Date: 06/15/2018 CLINICAL DATA:  Shortness of breath. Extremity edema. CHF and chronic kidney disease. EXAM: NUCLEAR MEDICINE VENTILATION - PERFUSION LUNG SCAN TECHNIQUE: Ventilation images were obtained in multiple projections using inhaled aerosol Tc-68m DTPA. Perfusion images were obtained in multiple projections after intravenous injection of Tc-100m-MAA. RADIOPHARMACEUTICALS:  31.5 mCi of Tc-39m DTPA aerosol inhalation and 4.3 mCi Tc53m-MAA IV COMPARISON:  Chest radiograph 06/14/2018 FINDINGS: Ventilation and perfusion throughout the right lung are normal aside from some aerosol deposition in the central airways. There is relatively uniformly diminished radiotracer throughout the left lung on both ventilation and perfusion images in a matching fashion with cardiomegaly and a left pleural effusion present on the comparison radiographs. No focal segmental perfusion defect is seen to specifically suggest pulmonary embolus. IMPRESSION: Low probability of pulmonary embolus. Electronically Signed   By: Sebastian Ache M.D.   On: 06/15/2018 13:09     CBC Recent Labs  Lab 06/14/18 1601 06/14/18 2133  06/15/18 0404  WBC 7.7 6.9 6.1  HGB 11.7* 11.8* 11.1*  HCT 41.9 42.1 38.7*  PLT 207 197 185  MCV 68.6* 69.1* 68.0*  MCH 19.1* 19.4* 19.5*  MCHC 27.9* 28.0* 28.7*  RDW 20.0* 20.5* 19.9*  LYMPHSABS  --   --  0.4*  MONOABS  --   --  1.0  EOSABS  --   --  0.2  BASOSABS  --   --  0.1    Chemistries  Recent Labs  Lab 06/14/18 1601 06/14/18 2133 06/15/18 0404 06/16/18 0510  NA 139  --  139 139  K 4.3  --  4.3 4.2  CL 99*  --  100* 99*  CO2 31  --  29 33*  GLUCOSE 101*  --  89 104*  BUN 42*  --  45* 43*  CREATININE 2.29* 2.72* 2.27* 2.30*  CALCIUM 8.3*  --  8.2* 8.2*  MG  --  2.3  --   --  AST  --  28  --   --   ALT  --  16*  --   --   ALKPHOS  --  81  --   --   BILITOT  --  1.0  --   --    ------------------------------------------------------------------------------------------------------------------ No results for input(s): CHOL, HDL, LDLCALC, TRIG, CHOLHDL, LDLDIRECT in the last 72 hours.  No results found for: HGBA1C ------------------------------------------------------------------------------------------------------------------ Recent Labs    06/14/18 2133 06/15/18 1129  TSH 11.162*  --   T3FREE  --  1.8*   ------------------------------------------------------------------------------------------------------------------ Recent Labs    06/14/18 2133 06/15/18 1129  VITAMINB12 333  --   FOLATE 7.0  --   FERRITIN 18*  --   TIBC 351  --   IRON 27*  --   RETICCTPCT 1.5 1.5    Coagulation profile No results for input(s): INR, PROTIME in the last 168 hours.  Recent Labs    06/14/18 2133  DDIMER 2.62*    Cardiac Enzymes Recent Labs  Lab 06/14/18 2133  TROPONINI <0.03   ------------------------------------------------------------------------------------------------------------------    Component Value Date/Time   BNP 125.3 (H) 06/14/2018 1601     Shon Haleourage Arie Powell M.D on 06/16/2018 at 1:58 PM  Between 7am to 7pm - Pager - [phone]  After  7pm go to [email or web address] - password [email or web address]  [facility] -  Office  [phone]   Voice Recognition Reubin Milan/Dragon dictation system was used to create this note, attempts have been made to correct errors. Please contact the author with questions and/or clarifications.

## 2018-06-16 NOTE — [place] FL2 (Signed)
[place] MEDICAID FL2 LEVEL OF CARE SCREENING TOOL     IDENTIFICATION  Patient Name: Justin [name] Birthdate: [birth year] Sex: male Admission Date (Current Location): 06/14/2018  [facility] [place]   Facility and Address:  [facility], [address], [place], [place] [address]      Provider Number: [id number]  Attending Physician Name and Address:  Shon HaleEmokpae, Courage, MD  Relative Name and Phone Number:       Current Level of Care: Hospital Recommended Level of Care: Skilled Nursing Facility Prior Approval Number:    Date Approved/Denied:   PASRR Number: [id number]  Discharge Plan: SNF    Current Diagnoses: Patient Active Problem List   Diagnosis Date Noted  . Acute CHF (congestive heart failure) (HCC) 06/14/2018  . CKD (chronic kidney disease) stage 3, GFR 30-59 ml/min (HCC) 06/14/2018  . Normochromic normocytic anemia 06/14/2018  . Right leg DVT (HCC) 06/08/2013    Orientation RESPIRATION BLADDER Height & Weight     Self, Time, Situation, Place  Normal Continent Weight: (!) 329 lb 6.4 oz (149.4 kg) Height:  6' (182.9 cm)  BEHAVIORAL SYMPTOMS/MOOD NEUROLOGICAL BOWEL NUTRITION STATUS  (None) (None) Continent Diet(Heart healthy. Fluid restriction 1200 mL.)  AMBULATORY STATUS COMMUNICATION OF NEEDS Skin   Limited Assist Verbally Normal                       Personal Care Assistance Level of Assistance  Bathing, Feeding, Dressing Bathing Assistance: Limited assistance Feeding assistance: Independent Dressing Assistance: Limited assistance     Functional Limitations Info  Sight, Hearing, Speech Sight Info: Adequate Hearing Info: Adequate Speech Info: Adequate    SPECIAL CARE FACTORS FREQUENCY  PT (By licensed PT), OT (By licensed OT)     PT Frequency: 5 x week OT Frequency: 5 x week            Contractures Contractures Info: Not present    Additional Factors Info  Code Status, Allergies Code  Status Info: Full Allergies Info: Nsaids, Sulfa Antibiotics.           Current Medications (06/16/2018):  This is the current hospital active medication list Current Facility-Administered Medications  Medication Dose Route Frequency Provider Last Rate Last Dose  . acetaminophen (TYLENOL) tablet 650 mg  650 mg Oral Q6H PRN Eduard ClosKakrakandy, Arshad N, MD   650 mg at 06/15/18 1054   Or  . acetaminophen (TYLENOL) suppository 650 mg  650 mg Rectal Q6H PRN Eduard ClosKakrakandy, Arshad N, MD      . albuterol (PROVENTIL) (2.5 MG/3ML) 0.083% nebulizer solution 2.5 mg  2.5 mg Nebulization Q4H PRN Eduard ClosKakrakandy, Arshad N, MD      . aspirin EC tablet 81 mg  81 mg Oral Daily Eduard ClosKakrakandy, Arshad N, MD   81 mg at 06/15/18 0908  . enoxaparin (LOVENOX) injection 75 mg  0.5 mg/kg Subcutaneous Q24H Hazeline JunkerGrunz, Ryan B, MD   75 mg at 06/15/18 2112  . furosemide (LASIX) injection 80 mg  80 mg Intravenous BID Tyrone NineGrunz, Ryan B, MD   80 mg at 06/16/18 0852  . potassium chloride SA (K-DUR,KLOR-CON) CR tablet 20 mEq  20 mEq Oral Q breakfast Eduard ClosKakrakandy, Arshad N, MD   20 mEq at 06/16/18 0852  . rosuvastatin (CRESTOR) tablet 20 mg  20 mg Oral QHS Eduard ClosKakrakandy, Arshad N, MD   20 mg at 06/15/18 2112     Discharge Medications: Please see discharge summary for a list of discharge medications.  Relevant  Imaging Results:  Relevant Lab Results:   Additional Information SS#: [id number]  Margarito Liner, LCSW

## 2018-06-16 NOTE — Progress Notes (Signed)
Physical Therapy Treatment Patient Details Name: Justin [name] MRN: [id number] DOB: [birth year] Today's Date: 06/16/2018    History of Present Illness Justin [name] is a 57 y.o. male with a history of stage III CKD, HTN, HLD, CHF, obesity and anemia who presented to the ED 6/17 with increasing swelling mostly in legs, but also arms and associated exertional shortness of breath.     PT Comments    Patient unable to progress ambulation this visit, more fatigued and weak than prior PT visit with unsteadiness on feet standing EOB. Session focused on improving mechanics with safe transfers and therex. SNF recs still appropriate, will follow and progress as tolerated.      Follow Up Recommendations  SNF;Supervision/Assistance - 24 hour     Equipment Recommendations  Rolling walker with 5" wheels;Other (comment)    Recommendations for Other Services       Precautions / Restrictions Precautions Precautions: Fall Precaution Comments: excessive LE and scrotal edema Restrictions Weight Bearing Restrictions: No    Mobility  Bed Mobility Overal bed mobility: Modified Independent             General bed mobility comments: HOB elevated, used bed rails, increased time, used hands to assist LEs  Transfers Overall transfer level: Needs assistance Equipment used: Rolling walker (2 wheeled) Transfers: Sit to/from Stand Sit to Stand: Mod assist;+2 physical assistance         General transfer comment: pt with significant challange standing today, max A from therapist, cues for proper hand placement on RW and bed, prxomity with RW.   Ambulation/Gait             General Gait Details: defered due to safety   Stairs             Wheelchair Mobility    Modified Rankin (Stroke Patients Only)       Balance Overall balance assessment: Needs assistance Sitting-balance support: Bilateral upper extremity supported Sitting balance-Leahy Scale: Fair     Standing  balance support: Bilateral upper extremity supported Standing balance-Leahy Scale: Poor Standing balance comment: needs RW                            Cognition Arousal/Alertness: Awake/alert Behavior During Therapy: WFL for tasks assessed/performed Overall Cognitive Status: Within Functional Limits for tasks assessed                                        Exercises      General Comments        Pertinent Vitals/Pain Pain Assessment: No/denies pain Pain Score: 8  Pain Location: bilat LEs R worse than the L Pain Descriptors / Indicators: Constant;Cramping Pain Intervention(s): Limited activity within patient's tolerance;Monitored during session;Premedicated before session;Repositioned    Home Living                      Prior Function            PT Goals (current goals can now be found in the care plan section) Acute Rehab PT Goals Patient Stated Goal: get stronger PT Goal Formulation: With patient Time For Goal Achievement: 06/29/18 Potential to Achieve Goals: Good Progress towards PT goals: Progressing toward goals    Frequency    Min 3X/week      PT Plan Current plan remains appropriate  Co-evaluation              AM-PAC PT "6 Clicks" Daily Activity  Outcome Measure  Difficulty turning over in bed (including adjusting bedclothes, sheets and blankets)?: Unable Difficulty moving from lying on back to sitting on the side of the bed? : Unable Difficulty sitting down on and standing up from a chair with arms (e.g., wheelchair, bedside commode, etc,.)?: Unable Help needed moving to and from a bed to chair (including a wheelchair)?: A Lot Help needed walking in hospital room?: A Lot Help needed climbing 3-5 steps with a railing? : Total 6 Click Score: 8    End of Session Equipment Utilized During Treatment: Gait belt Activity Tolerance: Other (comment) Patient left: in chair Nurse Communication: Mobility  status PT Visit Diagnosis: Unsteadiness on feet (R26.81);Repeated falls (R29.6);Difficulty in walking, not elsewhere classified (R26.2);Pain Pain - Right/Left: Right Pain - part of body: Leg     Time: 1055-1110 PT Time Calculation (min) (ACUTE ONLY): 15 min  Charges:  $Therapeutic Activity: 8-22 mins                    G Codes:       Etta GrandchildSean Domonique Brouillard, PT, DPT Acute Rehab Services Pager: [phone]     Etta GrandchildSean Delories Mauri 06/16/2018, 12:15 PM

## 2018-06-16 NOTE — Clinical Social Work Note (Addendum)
Patient's daughter would like to accept [facility] bed offer. CSW left message for admissions coordinator to notify. Please notify CSW at least 24 hours prior to discharge so insurance authorization can be started.  Charlynn CourtSarah Nicole Hafley, CSW [phone]  2:05 pm Per MD, patient will likely discharge on Friday. SNF will start auth today.  Charlynn CourtSarah Riyanna Crutchley, CSW [phone]

## 2018-06-16 NOTE — Clinical Social Work Note (Signed)
Clinical Social Work Assessment  Patient Details  Name: Justin [name] MRN: [id number] Date of Birth: 03-Dec-1961  Date of referral:  06/16/18               Reason for consult:  Facility Placement, Discharge Planning                Permission sought to share information with:  Facility Art therapist granted to share information::  Yes, Verbal Permission Granted  Name::        Agency::  SNF's  Relationship::     Contact Information:     Housing/Transportation Living arrangements for the past 2 months:  Apartment Source of Information:  Patient, Medical Team Patient Interpreter Needed:  None Criminal Activity/Legal Involvement Pertinent to Current Situation/Hospitalization:  No - Comment as needed Significant Relationships:  Siblings Lives with:  Self Do you feel safe going back to the place where you live?  Yes Need for family participation in patient care:  Yes (Comment)  Care giving concerns:  PT recommending SNF placement once medically stable for discharge.   Social Worker assessment / plan:  CSW met with patient. No supports at bedside. CSW introduced role and explained that PT recommendations would be discussed. Patient is agreeable to SNF placement. He states his sister wants him to go as well so he will have her review SNF list and choose preferences. No further concerns. CSW encouraged patient to contact CSW as needed. CSW will continue to follow patient for support and facilitate discharge to SNF once medically stable.  Employment status:  Disabled (Comment on whether or not currently receiving Disability) Insurance information:  Managed Medicare PT Recommendations:  [place] / Referral to community resources:  [place]  Patient/Family's Response to care:  Patient agreeable to SNF placement. Patient's sister supportive and involved in patient's care. Patient appreciated social work  intervention.  Patient/Family's Understanding of and Emotional Response to Diagnosis, Current Treatment, and Prognosis:  Patient has a good understanding of the reason for admission and his need for rehab prior to returning home. Patient appears happy with hospital care.  Emotional Assessment Appearance:  Appears stated age Attitude/Demeanor/Rapport:  Engaged, Gracious Affect (typically observed):  Accepting, Appropriate, Calm, Pleasant Orientation:  Oriented to Self, Oriented to Place, Oriented to  Time, Oriented to Situation Alcohol / Substance use:  Never Used Psych involvement (Current and /or in the community):  No (Comment)  Discharge Needs  Concerns to be addressed:  Care Coordination Readmission within the last 30 days:  No Current discharge risk:  Dependent with Mobility, Lives alone Barriers to Discharge:  Continued Medical Work up, [place], LCSW 06/16/2018, 10:35 AM

## 2018-06-17 ENCOUNTER — Inpatient Hospital Stay (HOSPITAL_COMMUNITY): Payer: Medicare HMO

## 2018-06-17 DIAGNOSIS — I5033 Acute on chronic diastolic (congestive) heart failure: Secondary | ICD-10-CM

## 2018-06-17 LAB — BASIC METABOLIC PANEL
Anion gap: 9 (ref 5–15)
BUN: 37 mg/dL — AB (ref 6–20)
CALCIUM: 8.2 mg/dL — AB (ref 8.9–10.3)
CO2: 33 mmol/L — ABNORMAL HIGH (ref 22–32)
Chloride: 98 mmol/L — ABNORMAL LOW (ref 101–111)
Creatinine, Ser: 2.13 mg/dL — ABNORMAL HIGH (ref 0.61–1.24)
GFR calc Af Amer: 37 mL/min — ABNORMAL LOW (ref >60)
GFR, EST NON AFRICAN AMERICAN: 32 mL/min — AB (ref >60)
GLUCOSE: 98 mg/dL (ref 65–99)
Potassium: 4.1 mmol/L (ref 3.5–5.1)
SODIUM: 140 mmol/L (ref 135–145)

## 2018-06-17 MED ORDER — FUROSEMIDE 10 MG/ML IJ SOLN
60.0000 mg | Freq: Two times a day (BID) | INTRAMUSCULAR | Status: DC
  Administered 2018-06-17 – 2018-06-18 (×2): 60 mg via INTRAVENOUS
  Filled 2018-06-17 (×2): qty 6

## 2018-06-17 MED ORDER — ENOXAPARIN SODIUM 40 MG/0.4ML ~~LOC~~ SOLN
40.0000 mg | SUBCUTANEOUS | Status: DC
  Administered 2018-06-18: 40 mg via SUBCUTANEOUS
  Filled 2018-06-17: qty 0.4

## 2018-06-17 NOTE — Plan of Care (Signed)
  Problem: Clinical Measurements: Goal: Ability to maintain clinical measurements within normal limits will improve Outcome: Progressing Goal: Will remain free from infection Outcome: Progressing   Problem: Coping: Goal: Level of anxiety will decrease Outcome: Progressing   Problem: Safety: Goal: Ability to remain free from injury will improve Outcome: Progressing   

## 2018-06-17 NOTE — Progress Notes (Signed)
Patient Demographics:    Justin [name], is a 57 y.o. male, DOB - 1961/08/05, ZOX:[id number]  Admit date - 06/14/2018   Admitting Physician Eduard Clos, MD  Outpatient Primary MD for the patient is System, Provider Not In  LOS - 3   Chief Complaint  Patient presents with  . Edema        Subjective:    [name] today has no fevers, no emesis,  No chest pain, eating ok, awaiting insurance approval for transfer to SNF  Assessment  & Plan :    Principal Problem:   (HFpEF) heart failure with preserved ejection fraction- Acute CHF Exacerbation Active Problems:   CKD (chronic kidney disease) stage 3, GFR 30-59 ml/min (HCC)   Hypochromic microcytic anemia/Chronic   Hypothyroidism  Brief summary 57 y.o. male with a history of stage III CKD, HTN, HLD, obesity and anemia who admitted on 06/14/2018 with shortness of breath and found to be in CHF exacerbation, clinical exam and chest x-ray consistent with CHF with pleural effusions. Echo shows preserved EF of 60 to 65%, difficult to evaluate diastolic function on echo, currently diuresing well but has generalized weakness and difficulty with ADLs.  PE and DVT work-up negative   Plan:- 1)HFpEF/Acute CHF Exacer --no shortness of breath at rest, continues to improve with diuresis,, decrease IV Lasix to 60 mg twice daily, daily weights and strict in and out,  Echo shows preserved EF of 60 to 65%, difficult to evaluate diastolic function on echo, symptomatically improving with diuresis  2)CKD III--- renal function appears to be close to baseline, preadmission creatinine around 2.2,  Avoid nephrotoxic agents/dehydration/hypotension  3)Hypochromic and microcytic chronic Anemia--- hemoglobin above 11, no evidence of ongoing reading at this time, work-up reveals iron deficiency with serum iron of 27 iron saturation of 8 and ferritin of 18, stool for occult  blood negative, patient states he had  colonoscopy within the last 3 to 5 years, findings, was advised to follow-up in 10 years, c/n iron tabs  4)Hypothyroidism-TSH over 11, free T3 1.8, levothyroxine 50 mcg daily, repeat TSH as outpatient in 4 to 6 weeks  5)Morbid obesity--- this complicates overall care, BMI is over 44  6)Dyslipidemia-stable, continue Crestor and aspirin  7)Generalized weakness/debility--- patient is very weak, PT eval appreciated, awaiting insurance approval for transfer to SNF for rehab prior to returning home  Code Status : Full   Disposition Plan  : SNF  DVT Prophylaxis  :  Lovenox   Lab Results  Component Value Date   PLT 185 06/15/2018    Inpatient Medications  Scheduled Meds: . aspirin EC  81 mg Oral Daily  . enoxaparin (LOVENOX) injection  0.5 mg/kg Subcutaneous Q24H  . furosemide  80 mg Intravenous BID  . potassium chloride SA  20 mEq Oral Q breakfast  . rosuvastatin  20 mg Oral QHS   Continuous Infusions: PRN Meds:.acetaminophen **OR** acetaminophen, albuterol    Anti-infectives (From admission, onward)   None        Objective:   Vitals:   06/16/18 1916 06/17/18 0447 06/17/18 0819 06/17/18 1136  BP: 117/71 114/77 119/88 120/73  Pulse: 77 75 73 73  Resp: 16 14  18   Temp: 98 F (36.7 C) 97.8 F (36.6 C)  97.8 F (36.6 C)  TempSrc:  Oral  Oral  SpO2: 100% 93% 94% 96%  Weight:  (!) 147.7 kg (325 lb 9.6 oz)    Height:        Wt Readings from Last 3 Encounters:  06/17/18 (!) 147.7 kg (325 lb 9.6 oz)  06/08/13 127.5 kg (281 lb 1.4 oz)     Intake/Output Summary (Last 24 hours) at 06/17/2018 1536 Last data filed at 06/17/2018 1232 Gross per 24 hour  Intake 1080 ml  Output 1650 ml  Net -570 ml     Physical Exam  Gen:- Awake Alert, morbidly obese, in no apparent distress  HEENT:- [place].AT, No sclera icterus Neck-Supple Neck,No JVD,.  Lungs-diminished in bases, faint bibasilar rales CV- S1, S2 normal Abd-  +ve B.Sounds, Abd  Soft, No tenderness,    Extremity/Skin:- 1 to +2  lower extremity edema, improving,  good pulses psych-affect is appropriate, oriented x3 Neuro-no new focal deficits, no tremors   Data Review:   Micro Results No results found for this or any previous visit (from the past 240 hour(s)).  Radiology Reports Dg Chest 2 View  Result Date: 06/17/2018 CLINICAL DATA:  Shortness of Breath EXAM: CHEST - 2 VIEW COMPARISON:  06/14/2018 FINDINGS: Cardiac shadow is stable. Increasing left pleural effusion is noted when compare with the prior exam. Mild vascular congestion is noted as well. There is likely underlying left lower lobe infiltrate. No bony abnormality is noted. IMPRESSION: Left basilar changes with increasing left pleural effusion. Electronically Signed   By: Alcide CleverMark  Lukens M.D.   On: 06/17/2018 08:34   Dg Chest 2 View  Result Date: 06/14/2018 CLINICAL DATA:  Shortness of breath. EXAM: CHEST - 2 VIEW COMPARISON:  Chest x-ray dated November 08, 2013. FINDINGS: Mild cardiomegaly. Normal mediastinal contours. Mild pulmonary vascular congestion. Small left pleural effusion with adjacent left lower lobe atelectasis. No pneumothorax. No acute osseous abnormality. Chronic mild anterior wedging of several midthoracic vertebral bodies. IMPRESSION: Pulmonary vascular congestion and small left pleural effusion. Electronically Signed   By: Obie DredgeWilliam T Derry M.D.   On: 06/14/2018 16:39   Nm Pulmonary Perf And Vent  Result Date: 06/15/2018 CLINICAL DATA:  Shortness of breath. Extremity edema. CHF and chronic kidney disease. EXAM: NUCLEAR MEDICINE VENTILATION - PERFUSION LUNG SCAN TECHNIQUE: Ventilation images were obtained in multiple projections using inhaled aerosol Tc-7051m DTPA. Perfusion images were obtained in multiple projections after intravenous injection of Tc-1451m-MAA. RADIOPHARMACEUTICALS:  31.5 mCi of Tc-1851m DTPA aerosol inhalation and 4.3 mCi Tc6451m-MAA IV COMPARISON:  Chest radiograph 06/14/2018 FINDINGS:  Ventilation and perfusion throughout the right lung are normal aside from some aerosol deposition in the central airways. There is relatively uniformly diminished radiotracer throughout the left lung on both ventilation and perfusion images in a matching fashion with cardiomegaly and a left pleural effusion present on the comparison radiographs. No focal segmental perfusion defect is seen to specifically suggest pulmonary embolus. IMPRESSION: Low probability of pulmonary embolus. Electronically Signed   By: Sebastian AcheAllen  Grady M.D.   On: 06/15/2018 13:09     CBC Recent Labs  Lab 06/14/18 1601 06/14/18 2133 06/15/18 0404  WBC 7.7 6.9 6.1  HGB 11.7* 11.8* 11.1*  HCT 41.9 42.1 38.7*  PLT 207 197 185  MCV 68.6* 69.1* 68.0*  MCH 19.1* 19.4* 19.5*  MCHC 27.9* 28.0* 28.7*  RDW 20.0* 20.5* 19.9*  LYMPHSABS  --   --  0.4*  MONOABS  --   --  1.0  EOSABS  --   --  0.2  BASOSABS  --   --  0.1    Chemistries  Recent Labs  Lab 06/14/18 1601 06/14/18 2133 06/15/18 0404 06/16/18 0510 06/17/18 0518  NA 139  --  139 139 140  K 4.3  --  4.3 4.2 4.1  CL 99*  --  100* 99* 98*  CO2 31  --  29 33* 33*  GLUCOSE 101*  --  89 104* 98  BUN 42*  --  45* 43* 37*  CREATININE 2.29* 2.72* 2.27* 2.30* 2.13*  CALCIUM 8.3*  --  8.2* 8.2* 8.2*  MG  --  2.3  --   --   --   AST  --  28  --   --   --   ALT  --  16*  --   --   --   ALKPHOS  --  81  --   --   --   BILITOT  --  1.0  --   --   --    ------------------------------------------------------------------------------------------------------------------ No results for input(s): CHOL, HDL, LDLCALC, TRIG, CHOLHDL, LDLDIRECT in the last 72 hours.  No results found for: HGBA1C ------------------------------------------------------------------------------------------------------------------ Recent Labs    06/14/18 2133 06/15/18 1129  TSH 11.162*  --   T3FREE  --  1.8*    ------------------------------------------------------------------------------------------------------------------ Recent Labs    06/14/18 2133 06/15/18 1129  VITAMINB12 333  --   FOLATE 7.0  --   FERRITIN 18*  --   TIBC 351  --   IRON 27*  --   RETICCTPCT 1.5 1.5    Coagulation profile No results for input(s): INR, PROTIME in the last 168 hours.  Recent Labs    06/14/18 2133  DDIMER 2.62*    Cardiac Enzymes Recent Labs  Lab 06/14/18 2133  TROPONINI <0.03   ------------------------------------------------------------------------------------------------------------------    Component Value Date/Time   BNP 125.3 (H) 06/14/2018 1601     Gerhard Rappaport M.D on 06/17/2018 at 3:36 PM  Between 7am to 7pm - Pager - [phone]  After 7pm go to [email or web address] - password [email or web address]  [facility] -  Office  [phone]   Voice Recognition Reubin [place] dictation system was used to create this note, attempts have been made to correct errors. Please contact the author with questions and/or clarifications.

## 2018-06-17 NOTE — Progress Notes (Signed)
Patient sitting in chair with complaints of discomfort and itching, gave tylenol and offered a bath. Plan to sit up in chair up after lunch

## 2018-06-17 NOTE — Clinical Social Work Note (Addendum)
Patient's sister will meet with SNF admissions coordinator at 1:30 today to complete admissions paperwork.  Charlynn CourtSarah Shylyn Younce, CSW [phone]  3:28 pm Sister completed admissions paperwork today. Insurance authorization still pending.  Charlynn CourtSarah Tyna Huertas, CSW [phone]  3:54 pm Insurance authorization approved for discharge to SNF tomorrow if stable.  Charlynn CourtSarah Anaria Kroner, CSW [phone]

## 2018-06-17 NOTE — Evaluation (Signed)
Occupational Therapy Evaluation Patient Details Name: Justin [name] MRN: [id number] DOB: [birth year] Today's Date: 06/17/2018    History of Present Illness ELLIAS [name] is a 57 y.o. male with a history of stage III CKD, HTN, HLD, CHF, obesity and anemia who presented to the ED 6/17 with increasing swelling mostly in legs, but also arms and associated exertional shortness of breath.    Clinical Impression   Pt admitted with the above diagnoses and presents with below problem list. Pt will benefit from continued acute OT to address the below listed deficits and maximize independence with basic ADLs prior to d/c to next venue. PTA pt was mod I with ADLs, some assistance for IADLs (cooking, housecleaning, grocery shopping). Pt is currently min guard to min A with basic transfers (needs elevated surface height), max A with LB ADLs, min A with UB ADLs. Transferred to recliner during session.      Follow Up Recommendations  SNF    Equipment Recommendations  Other (comment)(defer to next venue; likely needs tub transfer bench)    Recommendations for Other Services       Precautions / Restrictions Precautions Precautions: Fall Precaution Comments: excessive LE and scrotal edema Restrictions Weight Bearing Restrictions: No      Mobility Bed Mobility Overal bed mobility: Modified Independent             General bed mobility comments: HOB elevated, used bed rails, increased time, used hands to assist LEs  Transfers Overall transfer level: Needs assistance Equipment used: Rolling walker (2 wheeled) Transfers: Sit to/from [employer] Sit to Stand: Min guard;Min assist;From elevated surface Stand pivot transfers: Min guard;Min assist;From elevated surface       General transfer comment: better with highly elevated seat surface. assist to steady and for safety.     Balance Overall balance assessment: Needs assistance Sitting-balance support: Bilateral  upper extremity supported Sitting balance-Leahy Scale: Fair     Standing balance support: Bilateral upper extremity supported Standing balance-Leahy Scale: Poor Standing balance comment: needs RW                           ADL either performed or assessed with clinical judgement   ADL Overall ADL's : Needs assistance/impaired Eating/Feeding: Set up;Sitting   Grooming: Set up;Sitting   Upper Body Bathing: Minimal assistance;Sitting   Lower Body Bathing: Sit to/from stand;Maximal assistance   Upper Body Dressing : Minimal assistance;Sitting   Lower Body Dressing: Sit to/from stand;Maximal assistance   Toilet Transfer: Minimal assistance;Stand-pivot;BSC;Min guard;RW   Toileting- Architect and Hygiene: Maximal assistance;Sit to/from Nurse, children's Details (indicate cue type and reason): not able to complete tub transfer this session   General ADL Comments: Pt completed bed mobility and SPT EOB>recliner. Elevated seat heights and rw used.      Vision         Perception     Praxis      Pertinent Vitals/Pain Pain Assessment: Faces Faces Pain Scale: Hurts little more Pain Location: BLE Pain Intervention(s): Monitored during session;Repositioned     Hand Dominance Right   Extremity/Trunk Assessment Upper Extremity Assessment Upper Extremity Assessment: Overall WFL for tasks assessed;Generalized weakness   Lower Extremity Assessment Lower Extremity Assessment: Defer to PT evaluation       Communication Communication Communication: No difficulties   Cognition Arousal/Alertness: Awake/alert Behavior During Therapy: WFL for tasks assessed/performed Overall Cognitive Status: Within Functional Limits for tasks assessed  General Comments       Exercises     Shoulder Instructions      Home Living Family/patient expects to be discharged to:: Private residence Living  Arrangements: Alone Available Help at Discharge: Family;Available PRN/intermittently Type of Home: Apartment Home Access: Level entry     Home Layout: One level     Bathroom Shower/Tub: Tub only   FirefighterBathroom Toilet: Standard     Home Equipment: Environmental consultantWalker - 2 wheels          Prior Functioning/Environment Level of Independence: Needs assistance  Gait / Transfers Assistance Needed: uses RW for ambulation ADL's / Homemaking Assistance Needed: family does grocery shopping, cleaning and majority of cooking, pt sponge baths            OT Problem List: Decreased activity tolerance;Impaired balance (sitting and/or standing);Decreased knowledge of use of DME or AE;Decreased knowledge of precautions;Cardiopulmonary status limiting activity;Obesity;Pain;Increased edema      OT Treatment/Interventions: Self-care/ADL training;Therapeutic exercise;Energy conservation;DME and/or AE instruction;Therapeutic activities;Patient/family education;Balance training    OT Goals(Current goals can be found in the care plan section) Acute Rehab OT Goals Patient Stated Goal: get stronger OT Goal Formulation: With patient Time For Goal Achievement: 07/01/18 Potential to Achieve Goals: Good ADL Goals Pt Will Perform Grooming: with min guard assist;standing Pt Will Perform Upper Body Bathing: with set-up;sitting Pt Will Perform Lower Body Bathing: with min assist;sit to/from stand;with adaptive equipment Pt Will Perform Upper Body Dressing: with set-up;sitting Pt Will Perform Lower Body Dressing: with min assist;sit to/from stand;with adaptive equipment Pt Will Transfer to Toilet: with min guard assist;ambulating Pt Will Perform Toileting - Clothing Manipulation and hygiene: with min guard assist;with adaptive equipment Additional ADL Goal #1: Pt will independently verbalize 2 energy conservation strategies to incorporate into ADLs.  OT Frequency: Min 2X/week   Barriers to D/C:             Co-evaluation              AM-PAC PT "6 Clicks" Daily Activity     Outcome Measure Help from another person eating meals?: None Help from another person taking care of personal grooming?: None Help from another person toileting, which includes using toliet, bedpan, or urinal?: A Little Help from another person bathing (including washing, rinsing, drying)?: A Lot Help from another person to put on and taking off regular upper body clothing?: A Little Help from another person to put on and taking off regular lower body clothing?: A Lot 6 Click Score: 18   End of Session Equipment Utilized During Treatment: Rolling walker Nurse Communication: Mobility status  Activity Tolerance: Patient tolerated treatment well Patient left: in chair;with call bell/phone within reach;with chair alarm set  OT Visit Diagnosis: Muscle weakness (generalized) (M62.81);Pain;History of falling (Z91.81);Unsteadiness on feet (R26.81)                Time: [id number] OT Time Calculation (min): 30 min Charges:  OT General Charges $OT Visit: 1 Visit OT Evaluation $OT Eval Low Complexity: 1 Low OT Treatments $Self Care/Home Management : 8-22 mins G-Codes:       Pilar GrammesMathews, Jennah Satchell H 06/17/2018, 9:50 AM

## 2018-06-17 NOTE — Progress Notes (Signed)
Per MD Start new dose of lasix this evening.

## 2018-06-17 NOTE — Care Management Important Message (Signed)
Important Message  Patient Details  Name: Justin [name] MRN: [id number] Date of Birth: [birth year]   Medicare Important Message Given:  Yes    Calvin Chura P Teana Lindahl 06/17/2018, 3:10 PM

## 2018-06-18 DIAGNOSIS — Z743 Need for continuous supervision: Secondary | ICD-10-CM | POA: Diagnosis not present

## 2018-06-18 DIAGNOSIS — Z23 Encounter for immunization: Secondary | ICD-10-CM | POA: Diagnosis not present

## 2018-06-18 DIAGNOSIS — I5043 Acute on chronic combined systolic (congestive) and diastolic (congestive) heart failure: Secondary | ICD-10-CM | POA: Diagnosis not present

## 2018-06-18 DIAGNOSIS — M6281 Muscle weakness (generalized): Secondary | ICD-10-CM | POA: Diagnosis not present

## 2018-06-18 DIAGNOSIS — D509 Iron deficiency anemia, unspecified: Secondary | ICD-10-CM | POA: Diagnosis not present

## 2018-06-18 DIAGNOSIS — J449 Chronic obstructive pulmonary disease, unspecified: Secondary | ICD-10-CM | POA: Diagnosis not present

## 2018-06-18 DIAGNOSIS — E039 Hypothyroidism, unspecified: Secondary | ICD-10-CM | POA: Diagnosis not present

## 2018-06-18 DIAGNOSIS — R278 Other lack of coordination: Secondary | ICD-10-CM | POA: Diagnosis not present

## 2018-06-18 DIAGNOSIS — N183 Chronic kidney disease, stage 3 (moderate): Secondary | ICD-10-CM | POA: Diagnosis not present

## 2018-06-18 DIAGNOSIS — I5033 Acute on chronic diastolic (congestive) heart failure: Secondary | ICD-10-CM | POA: Diagnosis not present

## 2018-06-18 DIAGNOSIS — R2689 Other abnormalities of gait and mobility: Secondary | ICD-10-CM | POA: Diagnosis not present

## 2018-06-18 DIAGNOSIS — I10 Essential (primary) hypertension: Secondary | ICD-10-CM | POA: Diagnosis not present

## 2018-06-18 DIAGNOSIS — M25571 Pain in right ankle and joints of right foot: Secondary | ICD-10-CM | POA: Diagnosis not present

## 2018-06-18 DIAGNOSIS — I509 Heart failure, unspecified: Secondary | ICD-10-CM | POA: Diagnosis not present

## 2018-06-18 DIAGNOSIS — R279 Unspecified lack of coordination: Secondary | ICD-10-CM | POA: Diagnosis not present

## 2018-06-18 DIAGNOSIS — F0390 Unspecified dementia without behavioral disturbance: Secondary | ICD-10-CM | POA: Diagnosis not present

## 2018-06-18 DIAGNOSIS — D649 Anemia, unspecified: Secondary | ICD-10-CM | POA: Diagnosis not present

## 2018-06-18 DIAGNOSIS — E785 Hyperlipidemia, unspecified: Secondary | ICD-10-CM | POA: Diagnosis not present

## 2018-06-18 LAB — BASIC METABOLIC PANEL
ANION GAP: 5 (ref 5–15)
BUN: 35 mg/dL — ABNORMAL HIGH (ref 6–20)
CHLORIDE: 99 mmol/L — AB (ref 101–111)
CO2: 34 mmol/L — ABNORMAL HIGH (ref 22–32)
Calcium: 8.2 mg/dL — ABNORMAL LOW (ref 8.9–10.3)
Creatinine, Ser: 2 mg/dL — ABNORMAL HIGH (ref 0.61–1.24)
GFR calc Af Amer: 40 mL/min — ABNORMAL LOW (ref >60)
GFR calc non Af Amer: 35 mL/min — ABNORMAL LOW (ref >60)
GLUCOSE: 95 mg/dL (ref 65–99)
POTASSIUM: 4 mmol/L (ref 3.5–5.1)
Sodium: 138 mmol/L (ref 135–145)

## 2018-06-18 LAB — CBC
HEMATOCRIT: 39.1 % (ref 39.0–52.0)
HEMOGLOBIN: 11.1 g/dL — AB (ref 13.0–17.0)
MCH: 19.5 pg — ABNORMAL LOW (ref 26.0–34.0)
MCHC: 28.4 g/dL — ABNORMAL LOW (ref 30.0–36.0)
MCV: 68.8 fL — ABNORMAL LOW (ref 78.0–100.0)
Platelets: 188 10*3/uL (ref 150–400)
RBC: 5.68 MIL/uL (ref 4.22–5.81)
RDW: 20.8 % — ABNORMAL HIGH (ref 11.5–15.5)
WBC: 6.5 10*3/uL (ref 4.0–10.5)

## 2018-06-18 LAB — MAGNESIUM: Magnesium: 2.3 mg/dL (ref 1.7–2.4)

## 2018-06-18 MED ORDER — METOLAZONE 2.5 MG PO TABS: ORAL_TABLET | ORAL | 0 refills | Status: AC

## 2018-06-18 MED ORDER — METOLAZONE 5 MG PO TABS: ORAL_TABLET | ORAL | 0 refills | Status: DC

## 2018-06-18 MED ORDER — METOLAZONE 5 MG PO TABS
5.0000 mg | ORAL_TABLET | Freq: Once | ORAL | Status: AC
  Administered 2018-06-18: 5 mg via ORAL
  Filled 2018-06-18: qty 1

## 2018-06-18 NOTE — Care Management Note (Signed)
Case Management Note  Patient Details  Name: Justin [name] MRN: [id number] Date of Birth: 1961/02/25  Subjective/Objective:                 Patient will DC to SNF as facilitated by CSW.  Action/Plan:   Expected Discharge Date:                  Expected Discharge Plan:  Skilled Nursing Facility  In-House Referral:  Clinical Social Work  Discharge planning Services     Post Acute Care Choice:    Choice offered to:     DME Arranged:    DME Agency:     HH Arranged:    HH Agency:     Status of Service:  Completed, signed off  If discussed at [employer] Meetings, dates discussed:    Additional Comments:  Lawerance SabalDebbie Amdrew Oboyle, RN 06/18/2018, 11:42 AM

## 2018-06-18 NOTE — Clinical Social Work Note (Signed)
CSW facilitated patient discharge including contacting patient family and facility to confirm patient discharge plans. Clinical information faxed to facility and family agreeable with plan. CSW arranged ambulance transport via [facility] to [facility]. RN to call report prior to discharge [phone] Room 209).  CSW will sign off for now as social work intervention is no longer needed. Please consult us again if new needs arise.  Charlynn CourtSarah Vi Biddinger, CSW [phone]

## 2018-06-18 NOTE — Clinical Social Work Placement (Signed)
   CLINICAL SOCIAL WORK PLACEMENT  NOTE  Date:  06/18/2018  Patient Details  Name: Justin JeffersonWilliam L Littrell MRN: [id number] Date of Birth: 17-May-1961  Clinical Social Work is seeking post-discharge placement for this patient at the Skilled  Nursing Facility level of care (*CSW will initial, date and re-position this form in  chart as items are completed):  Yes   Patient/family provided with [facility] Clinical Social Work Department's list of facilities offering this level of care within the geographic area requested by the patient (or if unable, by the patient's family).  Yes   Patient/family informed of their freedom to choose among providers that offer the needed level of care, that participate in Medicare, Medicaid or managed care program needed by the patient, have an available bed and are willing to accept the patient.  Yes   Patient/family informed of [facility] ownership interest in [facility] and [facility], as well as of the fact that they are under no obligation to receive care at these facilities.  PASRR submitted to EDS on 06/16/18     PASRR number received on 06/16/18     Existing PASRR number confirmed on       FL2 transmitted to all facilities in geographic area requested by pt/family on 06/16/18     FL2 transmitted to all facilities within larger geographic area on       Patient informed that his/her managed care company has contracts with or will negotiate with certain facilities, including the following:        Yes   Patient/family informed of bed offers received.  [facility]     Physician recommends and patient chooses bed at      [facility] 06[facility]18/[facility].  Patient to be transferred to facility by [facility]     Patient family notified on 06/18/18 of transfer.  Name of family member notified:  [facility]     PHYSICIAN       Additional Comment:     _______________________________________________ Margarito LinerSarah C Tenasia Aull, LCSW 06/18/2018, 3:14 PM

## 2018-06-18 NOTE — Progress Notes (Signed)
MD courage called stated to give zaroxolyn 5mg  now and give the prescription for 2.5 M and F

## 2018-06-18 NOTE — Discharge Instructions (Signed)
1)Very low-salt diet advised 2)Weigh yourself daily, call if you gain more than 3 pounds in 1 day or more than 5 pounds in 1 week as your diuretic medications may need to be adjusted 3)Limit your Fluid  intake to no more than 60 ounces (1.8 Liters) per day 4)Repeat BMP/kidney and electrolyte test on 06/21/2018

## 2018-06-18 NOTE — Progress Notes (Addendum)
MD aware of pt swelling in abdomen and legs. MD stated it is chronic and planing for discharge

## 2018-06-18 NOTE — Progress Notes (Addendum)
[facility] at bedside, report given, pt has all belongings. [facility] has all paperwork. Pt called family  Pt telemetry and IV removed by nurse tech student  Report called to [facility], spoke to American FinancialKaren RN

## 2018-06-18 NOTE — Progress Notes (Signed)
Pt HR dropped to 30s and type 2 heart block per CCMD  Stripped saved in computer, MD Emokpae aware  Pt non symptomatic  Will continue to monitor

## 2018-06-18 NOTE — Progress Notes (Signed)
SATURATION QUALIFICATIONS: (This note is used to comply with regulatory documentation for home oxygen)  Patient Saturations on Room Air at Rest = 100%  Patient Saturations on Room Air while Ambulating = 93%  Please briefly explain why patient needs home oxygen: Completed by [place] MT

## 2018-06-18 NOTE — Discharge Summary (Signed)
Justin [name], is a 57 y.o. male  DOB 1961-09-02  MRN [id number].  Admission date:  06/14/2018  Admitting Physician  Eduard Clos, MD  Discharge Date:  06/18/2018   Primary MD  System, Provider Not In  Recommendations for primary care physician for things to follow:   1)Very low-salt diet advised 2)Weigh yourself daily, call if you gain more than 3 pounds in 1 day or more than 5 pounds in 1 week as your diuretic medications may need to be adjusted 3)Limit your Fluid  intake to no more than 60 ounces (1.8 Liters) per day 4)Repeat BMP/kidney and electrolyte test on 06/21/2018   Admission Diagnosis  Acute on chronic combined systolic and diastolic CHF (congestive heart failure) (HCC) [I50.43] CRI (chronic renal insufficiency), stage 3 (moderate) (HCC) [N18.3] Acute CHF (congestive heart failure) (HCC) [I50.9]   Discharge Diagnosis  Acute on chronic combined systolic and diastolic CHF (congestive heart failure) (HCC) [I50.43] CRI (chronic renal insufficiency), stage 3 (moderate) (HCC) [N18.3] Acute CHF (congestive heart failure) (HCC) [I50.9]    Principal Problem:   (HFpEF) heart failure with preserved ejection fraction- Acute CHF Exacerbation Active Problems:   CKD (chronic kidney disease) stage 3, GFR 30-59 ml/min (HCC)   Hypochromic microcytic anemia/Chronic   Hypothyroidism      Past Medical History:  Diagnosis Date  . Hypercholesteremia   . Hypertension   . Renal disorder     History reviewed. No pertinent surgical history.     HPI  from the history and physical done on the day of admission:    Chief Complaint: Increasing edema.  HPI: Justin [name] is a 57 y.o. male with history of chronic kidney disease stage III, hyperlipidemia, anemia, previous history of DVT presents to the ER with increasing lower extremity edema and shortness of breath.  Patient states patient was  placed on Lasix by patient's nephrology 8 months ago and used to take 40 mg daily.  Over the last 1 week patient has been taking 80 mg as increased by patient's primary care physician for worsening edema and shortness of breath.  Denies any chest pain productive cough fever chills.  Has been noticing increasing bilateral lower extremity edema which is extending up to the thigh.  ED Course: In the ER chest x-ray shows pulmonary vascular congestion with pleural effusion.  EKG shows normal sinus rhythm with first-degree AV block low voltage.  Patient was given 80 mg IV Lasix.  Exam patient has bilateral lower extremity edema extending up to the thigh.    Hospital Course:    Brief Summary 57 y.o.malewith a history of stage III CKD, HTN, HLD, obesity and anemia who admitted on 06/14/2018 with shortness of breath and found to be in CHF exacerbation, clinical exam and chest x-ray consistent with CHF with pleural effusions. Echo shows preserved EF of 60 to 65%, difficult to evaluate diastolic function on echo, currently diuresing well but has generalized weakness and difficulty with ADLs.  PE and DVT work-up negative   Plan:- 1)HFpEF/Acute CHF  Exacer --no shortness of breath at rest, continues to improve with diuresis,, treated with IV Lasix, fluid balance is negative, weight is down, lower extremity edema also improved,  Echo shows preserved EF of 60 to 65%, difficult to evaluate diastolic function on echo, symptomatically improved with diuresis, ambulated in hallway today post ambulation O2 sats 96 to 97% on room air, okay to discharge on p.o. Lasix 80 mg twice a day, and metolazone 2.5 mg on Mondays and Fridays only  2)CKD III--- renal function appears to be close to baseline, preadmission creatinine around 2.2,  Avoid nephrotoxic agents/dehydration/hypotension, discharge creatinine is 2.0  3)Hypochromic and microcytic chronic Anemia--- hemoglobin above 11, no evidence of ongoing bleeding at this  time, work-up reveals iron deficiency with serum iron of 27 iron saturation of 8 and ferritin of 18, stool for occult blood negative, patient states he had  colonoscopy within the last 3 to 5 years, findings, was advised to follow-up in 10 years, c/n iron tabs  4)Hypothyroidism-TSH over 11, free T3 1.8, levothyroxine 50 mcg daily, repeat TSH as outpatient in 4 to 6 weeks  5)Morbid obesity--- this complicates overall care, BMI is over 44  6)Dyslipidemia-stable, continue Crestor and aspirin  7)Generalized weakness/debility--- patient is very weak, PT eval appreciated, transfer to SNF for rehab prior to returning home  Code Status : Full   Disposition Plan  : SNF  Discharge Condition: stable  Follow UP  Contact information for after-discharge care    Destination    [facility] .   Service:  Skilled Nursing Contact information: [address] [place] [address] [phone]             Diet and Activity recommendation:  As advised  Discharge Instructions     Discharge Instructions    (HEART FAILURE PATIENTS) Call MD:  Anytime you have any of the following symptoms: 1) 3 pound weight gain in 24 hours or 5 pounds in 1 week 2) shortness of breath, with or without a dry hacking cough 3) swelling in the hands, feet or stomach 4) if you have to sleep on extra pillows at night in order to breathe.   Complete by:  As directed    Call MD for:  difficulty breathing, headache or visual disturbances   Complete by:  As directed    Call MD for:  persistant dizziness or light-headedness   Complete by:  As directed    Call MD for:  persistant nausea and vomiting   Complete by:  As directed    Call MD for:  temperature >100.4   Complete by:  As directed    Diet - low sodium heart healthy   Complete by:  As directed    Discharge instructions   Complete by:  As directed    1)Very low-salt diet advised 2)Weigh yourself daily, call if you  gain more than 3 pounds in 1 day or more than 5 pounds in 1 week as your diuretic medications may need to be adjusted 3)Limit your Fluid  intake to no more than 60 ounces (1.8 Liters) per day 4)Repeat BMP/kidney and electrolyte test on 06/21/2018   Increase activity slowly   Complete by:  As directed        Discharge Medications     Allergies as of 06/18/2018      Reactions   Nsaids Other (See Comments)   Affect kidneys negatively   Sulfa Antibiotics Diarrhea      Medication List  TAKE these medications   acetaminophen 500 MG tablet Commonly known as:  TYLENOL Take 1,000 mg by mouth every 6 (six) hours as needed (for pain or headaches).   albuterol 108 (90 Base) MCG/ACT inhaler Commonly known as:  PROVENTIL HFA;VENTOLIN HFA Inhale 2 puffs into the lungs every 4 (four) hours. What changed:    when to take this  reasons to take this   aspirin EC 81 MG tablet Take 81 mg by mouth daily.   furosemide 80 MG tablet Commonly known as:  LASIX Take 80 mg by mouth 2 (two) times daily.   GOLD BOND FOOT POWDER MAX ST Powd Apply 1 application topically See admin instructions. Apply 1 application to affected areas of feet daily as directed   metolazone 2.5 MG tablet Commonly known as:  ZAROXOLYN Take 1 tablet (2.5 mg ) every Monday and Friday morning   ONE-A-DAY MENS 50+ ADVANTAGE Tabs Take 1 tablet by mouth daily.   potassium chloride SA 20 MEQ tablet Commonly known as:  K-DUR,KLOR-CON Take 20 mEq by mouth daily with breakfast.   rosuvastatin 20 MG tablet Commonly known as:  CRESTOR Take 20 mg by mouth at bedtime.   tolnaftate 1 % cream Commonly known as:  TINACTIN Apply 1 application topically See admin instructions. Apply 1 application between toes daily as directed       Major procedures and Radiology Reports - PLEASE review detailed and final reports for all details, in brief    Dg Chest 2 View  Result Date: 06/17/2018 CLINICAL DATA:  Shortness of Breath  EXAM: CHEST - 2 VIEW COMPARISON:  06/14/2018 FINDINGS: Cardiac shadow is stable. Increasing left pleural effusion is noted when compare with the prior exam. Mild vascular congestion is noted as well. There is likely underlying left lower lobe infiltrate. No bony abnormality is noted. IMPRESSION: Left basilar changes with increasing left pleural effusion. Electronically Signed   By: Alcide CleverMark  Lukens M.D.   On: 06/17/2018 08:34   Dg Chest 2 View  Result Date: 06/14/2018 CLINICAL DATA:  Shortness of breath. EXAM: CHEST - 2 VIEW COMPARISON:  Chest x-ray dated November 08, 2013. FINDINGS: Mild cardiomegaly. Normal mediastinal contours. Mild pulmonary vascular congestion. Small left pleural effusion with adjacent left lower lobe atelectasis. No pneumothorax. No acute osseous abnormality. Chronic mild anterior wedging of several midthoracic vertebral bodies. IMPRESSION: Pulmonary vascular congestion and small left pleural effusion. Electronically Signed   By: Obie DredgeWilliam T Derry M.D.   On: 06/14/2018 16:39   Nm Pulmonary Perf And Vent  Result Date: 06/15/2018 CLINICAL DATA:  Shortness of breath. Extremity edema. CHF and chronic kidney disease. EXAM: NUCLEAR MEDICINE VENTILATION - PERFUSION LUNG SCAN TECHNIQUE: Ventilation images were obtained in multiple projections using inhaled aerosol Tc-159m DTPA. Perfusion images were obtained in multiple projections after intravenous injection of Tc-679m-MAA. RADIOPHARMACEUTICALS:  31.5 mCi of Tc-1039m DTPA aerosol inhalation and 4.3 mCi Tc649m-MAA IV COMPARISON:  Chest radiograph 06/14/2018 FINDINGS: Ventilation and perfusion throughout the right lung are normal aside from some aerosol deposition in the central airways. There is relatively uniformly diminished radiotracer throughout the left lung on both ventilation and perfusion images in a matching fashion with cardiomegaly and a left pleural effusion present on the comparison radiographs. No focal segmental perfusion defect is seen to  specifically suggest pulmonary embolus. IMPRESSION: Low probability of pulmonary embolus. Electronically Signed   By: Sebastian AcheAllen  Grady M.D.   On: 06/15/2018 13:09    Micro Results    No results found for this or any previous  visit (from the past 240 hour(s)).     Today   Subjective    [name] today has no new complaints, ambulated in hallway without hypoxia, dizziness or significant dyspnea on exertion, no chest pain          Patient has been seen and examined prior to discharge   Objective   Blood pressure 112/73, pulse 71, temperature 97.8 F (36.6 C), temperature source Oral, resp. rate 18, height 6' (1.829 m), weight (!) 147.1 kg (324 lb 6.4 oz), SpO2 93 %.   Intake/Output Summary (Last 24 hours) at 06/18/2018 1502 Last data filed at 06/18/2018 1334 Gross per 24 hour  Intake 840 ml  Output 1925 ml  Net -1085 ml    Exam Gen:- Awake Alert, morbidly obese, in no apparent distress  HEENT:- [place].AT, No sclera icterus Neck-Supple Neck,No JVD,.  Lungs-diminished in bases, faint bibasilar rales CV- S1, S2 normal Abd-  +ve B.Sounds, Abd Soft, No tenderness,    Extremity/Skin:- 1 to +2  lower extremity edema, improving,  good pulses psych-affect is appropriate, oriented x3 Neuro-no new focal deficits, no tremors     Data Review   CBC w Diff:  Lab Results  Component Value Date   WBC 6.5 06/18/2018   HGB 11.1 (L) 06/18/2018   HCT 39.1 06/18/2018   PLT 188 06/18/2018   LYMPHOPCT 6 06/15/2018   MONOPCT 16 06/15/2018   EOSPCT 3 06/15/2018   BASOPCT 1 06/15/2018    CMP:  Lab Results  Component Value Date   NA 138 06/18/2018   K 4.0 06/18/2018   CL 99 (L) 06/18/2018   CO2 34 (H) 06/18/2018   BUN 35 (H) 06/18/2018   CREATININE 2.00 (H) 06/18/2018   PROT 5.8 (L) 06/14/2018   ALBUMIN 2.3 (L) 06/14/2018   BILITOT 1.0 06/14/2018   ALKPHOS 81 06/14/2018   AST 28 06/14/2018   ALT 16 (L) 06/14/2018   Total Discharge time is about 33 minutes  Shon Hale  M.D on 06/18/2018 at 3:02 PM  [facility]   Office  [phone]  Voice Recognition Reubin [place] dictation system was used to create this note, attempts have been made to correct errors. Please contact the author with questions and/or clarifications.

## 2018-06-21 DIAGNOSIS — I5043 Acute on chronic combined systolic (congestive) and diastolic (congestive) heart failure: Secondary | ICD-10-CM | POA: Diagnosis not present

## 2018-06-21 DIAGNOSIS — E039 Hypothyroidism, unspecified: Secondary | ICD-10-CM | POA: Diagnosis not present

## 2018-06-21 DIAGNOSIS — N183 Chronic kidney disease, stage 3 (moderate): Secondary | ICD-10-CM | POA: Diagnosis not present

## 2018-06-21 DIAGNOSIS — D509 Iron deficiency anemia, unspecified: Secondary | ICD-10-CM | POA: Diagnosis not present

## 2018-06-25 DIAGNOSIS — I509 Heart failure, unspecified: Secondary | ICD-10-CM | POA: Diagnosis not present

## 2018-06-25 DIAGNOSIS — N183 Chronic kidney disease, stage 3 (moderate): Secondary | ICD-10-CM | POA: Diagnosis not present

## 2018-06-28 DIAGNOSIS — E039 Hypothyroidism, unspecified: Secondary | ICD-10-CM | POA: Diagnosis not present

## 2018-06-28 DIAGNOSIS — I5043 Acute on chronic combined systolic (congestive) and diastolic (congestive) heart failure: Secondary | ICD-10-CM | POA: Diagnosis not present

## 2018-06-28 DIAGNOSIS — N183 Chronic kidney disease, stage 3 (moderate): Secondary | ICD-10-CM | POA: Diagnosis not present

## 2018-06-28 DIAGNOSIS — I10 Essential (primary) hypertension: Secondary | ICD-10-CM | POA: Diagnosis not present

## 2018-07-01 DIAGNOSIS — I10 Essential (primary) hypertension: Secondary | ICD-10-CM | POA: Diagnosis not present

## 2018-07-01 DIAGNOSIS — F0390 Unspecified dementia without behavioral disturbance: Secondary | ICD-10-CM | POA: Diagnosis not present

## 2018-07-01 DIAGNOSIS — I509 Heart failure, unspecified: Secondary | ICD-10-CM | POA: Diagnosis not present

## 2018-07-01 DIAGNOSIS — N183 Chronic kidney disease, stage 3 (moderate): Secondary | ICD-10-CM | POA: Diagnosis not present

## 2018-07-01 DIAGNOSIS — D649 Anemia, unspecified: Secondary | ICD-10-CM | POA: Diagnosis not present

## 2018-07-01 DIAGNOSIS — E039 Hypothyroidism, unspecified: Secondary | ICD-10-CM | POA: Diagnosis not present

## 2018-07-06 DIAGNOSIS — E039 Hypothyroidism, unspecified: Secondary | ICD-10-CM | POA: Diagnosis not present

## 2018-07-06 DIAGNOSIS — I5043 Acute on chronic combined systolic (congestive) and diastolic (congestive) heart failure: Secondary | ICD-10-CM | POA: Diagnosis not present

## 2018-07-06 DIAGNOSIS — I10 Essential (primary) hypertension: Secondary | ICD-10-CM | POA: Diagnosis not present

## 2018-07-06 DIAGNOSIS — N183 Chronic kidney disease, stage 3 (moderate): Secondary | ICD-10-CM | POA: Diagnosis not present

## 2018-07-13 DIAGNOSIS — N183 Chronic kidney disease, stage 3 (moderate): Secondary | ICD-10-CM | POA: Diagnosis not present

## 2018-07-13 DIAGNOSIS — I10 Essential (primary) hypertension: Secondary | ICD-10-CM | POA: Diagnosis not present

## 2018-07-13 DIAGNOSIS — E039 Hypothyroidism, unspecified: Secondary | ICD-10-CM | POA: Diagnosis not present

## 2018-07-13 DIAGNOSIS — I5043 Acute on chronic combined systolic (congestive) and diastolic (congestive) heart failure: Secondary | ICD-10-CM | POA: Diagnosis not present

## 2018-07-20 DIAGNOSIS — I5043 Acute on chronic combined systolic (congestive) and diastolic (congestive) heart failure: Secondary | ICD-10-CM | POA: Diagnosis not present

## 2018-07-20 DIAGNOSIS — E785 Hyperlipidemia, unspecified: Secondary | ICD-10-CM | POA: Diagnosis not present

## 2018-07-20 DIAGNOSIS — E039 Hypothyroidism, unspecified: Secondary | ICD-10-CM | POA: Diagnosis not present

## 2018-07-20 DIAGNOSIS — I10 Essential (primary) hypertension: Secondary | ICD-10-CM | POA: Diagnosis not present

## 2018-07-22 ENCOUNTER — Encounter: Payer: Self-pay | Admitting: Cardiology

## 2018-07-22 DIAGNOSIS — E039 Hypothyroidism, unspecified: Secondary | ICD-10-CM | POA: Diagnosis not present

## 2018-07-22 DIAGNOSIS — Z79899 Other long term (current) drug therapy: Secondary | ICD-10-CM | POA: Diagnosis not present

## 2018-08-04 DIAGNOSIS — F338 Other recurrent depressive disorders: Secondary | ICD-10-CM | POA: Diagnosis not present

## 2018-08-10 DIAGNOSIS — D649 Anemia, unspecified: Secondary | ICD-10-CM | POA: Diagnosis not present

## 2018-08-10 DIAGNOSIS — R2689 Other abnormalities of gait and mobility: Secondary | ICD-10-CM | POA: Diagnosis not present

## 2018-08-10 DIAGNOSIS — E785 Hyperlipidemia, unspecified: Secondary | ICD-10-CM | POA: Diagnosis not present

## 2018-08-10 DIAGNOSIS — E039 Hypothyroidism, unspecified: Secondary | ICD-10-CM | POA: Diagnosis not present

## 2018-08-10 DIAGNOSIS — N183 Chronic kidney disease, stage 3 (moderate): Secondary | ICD-10-CM | POA: Diagnosis not present

## 2018-08-10 DIAGNOSIS — M6281 Muscle weakness (generalized): Secondary | ICD-10-CM | POA: Diagnosis not present

## 2018-08-10 DIAGNOSIS — R278 Other lack of coordination: Secondary | ICD-10-CM | POA: Diagnosis not present

## 2018-08-10 DIAGNOSIS — J449 Chronic obstructive pulmonary disease, unspecified: Secondary | ICD-10-CM | POA: Diagnosis not present

## 2018-08-10 DIAGNOSIS — I10 Essential (primary) hypertension: Secondary | ICD-10-CM | POA: Diagnosis not present

## 2018-08-11 DIAGNOSIS — R2689 Other abnormalities of gait and mobility: Secondary | ICD-10-CM | POA: Diagnosis not present

## 2018-08-11 DIAGNOSIS — M6281 Muscle weakness (generalized): Secondary | ICD-10-CM | POA: Diagnosis not present

## 2018-08-11 DIAGNOSIS — J449 Chronic obstructive pulmonary disease, unspecified: Secondary | ICD-10-CM | POA: Diagnosis not present

## 2018-08-11 DIAGNOSIS — D649 Anemia, unspecified: Secondary | ICD-10-CM | POA: Diagnosis not present

## 2018-08-11 DIAGNOSIS — I10 Essential (primary) hypertension: Secondary | ICD-10-CM | POA: Diagnosis not present

## 2018-08-11 DIAGNOSIS — E039 Hypothyroidism, unspecified: Secondary | ICD-10-CM | POA: Diagnosis not present

## 2018-08-11 DIAGNOSIS — F331 Major depressive disorder, recurrent, moderate: Secondary | ICD-10-CM | POA: Diagnosis not present

## 2018-08-11 DIAGNOSIS — N183 Chronic kidney disease, stage 3 (moderate): Secondary | ICD-10-CM | POA: Diagnosis not present

## 2018-08-11 DIAGNOSIS — R278 Other lack of coordination: Secondary | ICD-10-CM | POA: Diagnosis not present

## 2018-08-11 DIAGNOSIS — E785 Hyperlipidemia, unspecified: Secondary | ICD-10-CM | POA: Diagnosis not present

## 2018-08-12 DIAGNOSIS — M6281 Muscle weakness (generalized): Secondary | ICD-10-CM | POA: Diagnosis not present

## 2018-08-12 DIAGNOSIS — N183 Chronic kidney disease, stage 3 (moderate): Secondary | ICD-10-CM | POA: Diagnosis not present

## 2018-08-12 DIAGNOSIS — J449 Chronic obstructive pulmonary disease, unspecified: Secondary | ICD-10-CM | POA: Diagnosis not present

## 2018-08-12 DIAGNOSIS — E039 Hypothyroidism, unspecified: Secondary | ICD-10-CM | POA: Diagnosis not present

## 2018-08-12 DIAGNOSIS — I509 Heart failure, unspecified: Secondary | ICD-10-CM | POA: Diagnosis not present

## 2018-08-12 DIAGNOSIS — R2689 Other abnormalities of gait and mobility: Secondary | ICD-10-CM | POA: Diagnosis not present

## 2018-08-12 DIAGNOSIS — R278 Other lack of coordination: Secondary | ICD-10-CM | POA: Diagnosis not present

## 2018-08-12 DIAGNOSIS — E785 Hyperlipidemia, unspecified: Secondary | ICD-10-CM | POA: Diagnosis not present

## 2018-08-12 DIAGNOSIS — D649 Anemia, unspecified: Secondary | ICD-10-CM | POA: Diagnosis not present

## 2018-08-12 DIAGNOSIS — I10 Essential (primary) hypertension: Secondary | ICD-10-CM | POA: Diagnosis not present

## 2018-08-13 DIAGNOSIS — R278 Other lack of coordination: Secondary | ICD-10-CM | POA: Diagnosis not present

## 2018-08-13 DIAGNOSIS — J449 Chronic obstructive pulmonary disease, unspecified: Secondary | ICD-10-CM | POA: Diagnosis not present

## 2018-08-13 DIAGNOSIS — E785 Hyperlipidemia, unspecified: Secondary | ICD-10-CM | POA: Diagnosis not present

## 2018-08-13 DIAGNOSIS — M6281 Muscle weakness (generalized): Secondary | ICD-10-CM | POA: Diagnosis not present

## 2018-08-13 DIAGNOSIS — D649 Anemia, unspecified: Secondary | ICD-10-CM | POA: Diagnosis not present

## 2018-08-13 DIAGNOSIS — N183 Chronic kidney disease, stage 3 (moderate): Secondary | ICD-10-CM | POA: Diagnosis not present

## 2018-08-13 DIAGNOSIS — E039 Hypothyroidism, unspecified: Secondary | ICD-10-CM | POA: Diagnosis not present

## 2018-08-13 DIAGNOSIS — R2689 Other abnormalities of gait and mobility: Secondary | ICD-10-CM | POA: Diagnosis not present

## 2018-08-13 DIAGNOSIS — I10 Essential (primary) hypertension: Secondary | ICD-10-CM | POA: Diagnosis not present

## 2018-08-16 DIAGNOSIS — N183 Chronic kidney disease, stage 3 (moderate): Secondary | ICD-10-CM | POA: Diagnosis not present

## 2018-08-16 DIAGNOSIS — R278 Other lack of coordination: Secondary | ICD-10-CM | POA: Diagnosis not present

## 2018-08-16 DIAGNOSIS — M6281 Muscle weakness (generalized): Secondary | ICD-10-CM | POA: Diagnosis not present

## 2018-08-16 DIAGNOSIS — R2689 Other abnormalities of gait and mobility: Secondary | ICD-10-CM | POA: Diagnosis not present

## 2018-08-16 DIAGNOSIS — I10 Essential (primary) hypertension: Secondary | ICD-10-CM | POA: Diagnosis not present

## 2018-08-16 DIAGNOSIS — J449 Chronic obstructive pulmonary disease, unspecified: Secondary | ICD-10-CM | POA: Diagnosis not present

## 2018-08-16 DIAGNOSIS — E785 Hyperlipidemia, unspecified: Secondary | ICD-10-CM | POA: Diagnosis not present

## 2018-08-16 DIAGNOSIS — D649 Anemia, unspecified: Secondary | ICD-10-CM | POA: Diagnosis not present

## 2018-08-16 DIAGNOSIS — E039 Hypothyroidism, unspecified: Secondary | ICD-10-CM | POA: Diagnosis not present

## 2018-08-17 DIAGNOSIS — R278 Other lack of coordination: Secondary | ICD-10-CM | POA: Diagnosis not present

## 2018-08-17 DIAGNOSIS — I10 Essential (primary) hypertension: Secondary | ICD-10-CM | POA: Diagnosis not present

## 2018-08-17 DIAGNOSIS — D649 Anemia, unspecified: Secondary | ICD-10-CM | POA: Diagnosis not present

## 2018-08-17 DIAGNOSIS — N183 Chronic kidney disease, stage 3 (moderate): Secondary | ICD-10-CM | POA: Diagnosis not present

## 2018-08-17 DIAGNOSIS — M6281 Muscle weakness (generalized): Secondary | ICD-10-CM | POA: Diagnosis not present

## 2018-08-17 DIAGNOSIS — E039 Hypothyroidism, unspecified: Secondary | ICD-10-CM | POA: Diagnosis not present

## 2018-08-17 DIAGNOSIS — R2689 Other abnormalities of gait and mobility: Secondary | ICD-10-CM | POA: Diagnosis not present

## 2018-08-17 DIAGNOSIS — J449 Chronic obstructive pulmonary disease, unspecified: Secondary | ICD-10-CM | POA: Diagnosis not present

## 2018-08-17 DIAGNOSIS — E785 Hyperlipidemia, unspecified: Secondary | ICD-10-CM | POA: Diagnosis not present

## 2018-08-18 DIAGNOSIS — J449 Chronic obstructive pulmonary disease, unspecified: Secondary | ICD-10-CM | POA: Diagnosis not present

## 2018-08-18 DIAGNOSIS — E039 Hypothyroidism, unspecified: Secondary | ICD-10-CM | POA: Diagnosis not present

## 2018-08-18 DIAGNOSIS — M6281 Muscle weakness (generalized): Secondary | ICD-10-CM | POA: Diagnosis not present

## 2018-08-18 DIAGNOSIS — R2689 Other abnormalities of gait and mobility: Secondary | ICD-10-CM | POA: Diagnosis not present

## 2018-08-18 DIAGNOSIS — D649 Anemia, unspecified: Secondary | ICD-10-CM | POA: Diagnosis not present

## 2018-08-18 DIAGNOSIS — N183 Chronic kidney disease, stage 3 (moderate): Secondary | ICD-10-CM | POA: Diagnosis not present

## 2018-08-18 DIAGNOSIS — F338 Other recurrent depressive disorders: Secondary | ICD-10-CM | POA: Diagnosis not present

## 2018-08-18 DIAGNOSIS — E785 Hyperlipidemia, unspecified: Secondary | ICD-10-CM | POA: Diagnosis not present

## 2018-08-18 DIAGNOSIS — I10 Essential (primary) hypertension: Secondary | ICD-10-CM | POA: Diagnosis not present

## 2018-08-18 DIAGNOSIS — R278 Other lack of coordination: Secondary | ICD-10-CM | POA: Diagnosis not present

## 2018-08-19 DIAGNOSIS — E039 Hypothyroidism, unspecified: Secondary | ICD-10-CM | POA: Diagnosis not present

## 2018-08-19 DIAGNOSIS — D649 Anemia, unspecified: Secondary | ICD-10-CM | POA: Diagnosis not present

## 2018-08-19 DIAGNOSIS — E785 Hyperlipidemia, unspecified: Secondary | ICD-10-CM | POA: Diagnosis not present

## 2018-08-19 DIAGNOSIS — R278 Other lack of coordination: Secondary | ICD-10-CM | POA: Diagnosis not present

## 2018-08-19 DIAGNOSIS — M6281 Muscle weakness (generalized): Secondary | ICD-10-CM | POA: Diagnosis not present

## 2018-08-19 DIAGNOSIS — I10 Essential (primary) hypertension: Secondary | ICD-10-CM | POA: Diagnosis not present

## 2018-08-19 DIAGNOSIS — J449 Chronic obstructive pulmonary disease, unspecified: Secondary | ICD-10-CM | POA: Diagnosis not present

## 2018-08-19 DIAGNOSIS — N183 Chronic kidney disease, stage 3 (moderate): Secondary | ICD-10-CM | POA: Diagnosis not present

## 2018-08-19 DIAGNOSIS — R2689 Other abnormalities of gait and mobility: Secondary | ICD-10-CM | POA: Diagnosis not present

## 2018-08-20 DIAGNOSIS — I10 Essential (primary) hypertension: Secondary | ICD-10-CM | POA: Diagnosis not present

## 2018-08-20 DIAGNOSIS — E039 Hypothyroidism, unspecified: Secondary | ICD-10-CM | POA: Diagnosis not present

## 2018-08-20 DIAGNOSIS — D649 Anemia, unspecified: Secondary | ICD-10-CM | POA: Diagnosis not present

## 2018-08-20 DIAGNOSIS — E785 Hyperlipidemia, unspecified: Secondary | ICD-10-CM | POA: Diagnosis not present

## 2018-08-20 DIAGNOSIS — R2689 Other abnormalities of gait and mobility: Secondary | ICD-10-CM | POA: Diagnosis not present

## 2018-08-20 DIAGNOSIS — M6281 Muscle weakness (generalized): Secondary | ICD-10-CM | POA: Diagnosis not present

## 2018-08-20 DIAGNOSIS — J449 Chronic obstructive pulmonary disease, unspecified: Secondary | ICD-10-CM | POA: Diagnosis not present

## 2018-08-20 DIAGNOSIS — R278 Other lack of coordination: Secondary | ICD-10-CM | POA: Diagnosis not present

## 2018-08-20 DIAGNOSIS — N183 Chronic kidney disease, stage 3 (moderate): Secondary | ICD-10-CM | POA: Diagnosis not present

## 2018-08-23 DIAGNOSIS — R2689 Other abnormalities of gait and mobility: Secondary | ICD-10-CM | POA: Diagnosis not present

## 2018-08-23 DIAGNOSIS — M6281 Muscle weakness (generalized): Secondary | ICD-10-CM | POA: Diagnosis not present

## 2018-08-23 DIAGNOSIS — J449 Chronic obstructive pulmonary disease, unspecified: Secondary | ICD-10-CM | POA: Diagnosis not present

## 2018-08-23 DIAGNOSIS — D649 Anemia, unspecified: Secondary | ICD-10-CM | POA: Diagnosis not present

## 2018-08-23 DIAGNOSIS — I10 Essential (primary) hypertension: Secondary | ICD-10-CM | POA: Diagnosis not present

## 2018-08-23 DIAGNOSIS — E039 Hypothyroidism, unspecified: Secondary | ICD-10-CM | POA: Diagnosis not present

## 2018-08-23 DIAGNOSIS — R278 Other lack of coordination: Secondary | ICD-10-CM | POA: Diagnosis not present

## 2018-08-23 DIAGNOSIS — E785 Hyperlipidemia, unspecified: Secondary | ICD-10-CM | POA: Diagnosis not present

## 2018-08-23 DIAGNOSIS — N183 Chronic kidney disease, stage 3 (moderate): Secondary | ICD-10-CM | POA: Diagnosis not present

## 2018-08-24 DIAGNOSIS — M6281 Muscle weakness (generalized): Secondary | ICD-10-CM | POA: Diagnosis not present

## 2018-08-24 DIAGNOSIS — E785 Hyperlipidemia, unspecified: Secondary | ICD-10-CM | POA: Diagnosis not present

## 2018-08-24 DIAGNOSIS — R278 Other lack of coordination: Secondary | ICD-10-CM | POA: Diagnosis not present

## 2018-08-24 DIAGNOSIS — J449 Chronic obstructive pulmonary disease, unspecified: Secondary | ICD-10-CM | POA: Diagnosis not present

## 2018-08-24 DIAGNOSIS — I10 Essential (primary) hypertension: Secondary | ICD-10-CM | POA: Diagnosis not present

## 2018-08-24 DIAGNOSIS — N183 Chronic kidney disease, stage 3 (moderate): Secondary | ICD-10-CM | POA: Diagnosis not present

## 2018-08-24 DIAGNOSIS — D649 Anemia, unspecified: Secondary | ICD-10-CM | POA: Diagnosis not present

## 2018-08-24 DIAGNOSIS — E039 Hypothyroidism, unspecified: Secondary | ICD-10-CM | POA: Diagnosis not present

## 2018-08-24 DIAGNOSIS — R2689 Other abnormalities of gait and mobility: Secondary | ICD-10-CM | POA: Diagnosis not present

## 2018-08-25 DIAGNOSIS — E039 Hypothyroidism, unspecified: Secondary | ICD-10-CM | POA: Diagnosis not present

## 2018-08-25 DIAGNOSIS — M6281 Muscle weakness (generalized): Secondary | ICD-10-CM | POA: Diagnosis not present

## 2018-08-25 DIAGNOSIS — E785 Hyperlipidemia, unspecified: Secondary | ICD-10-CM | POA: Diagnosis not present

## 2018-08-25 DIAGNOSIS — R2689 Other abnormalities of gait and mobility: Secondary | ICD-10-CM | POA: Diagnosis not present

## 2018-08-25 DIAGNOSIS — J449 Chronic obstructive pulmonary disease, unspecified: Secondary | ICD-10-CM | POA: Diagnosis not present

## 2018-08-25 DIAGNOSIS — R278 Other lack of coordination: Secondary | ICD-10-CM | POA: Diagnosis not present

## 2018-08-25 DIAGNOSIS — F338 Other recurrent depressive disorders: Secondary | ICD-10-CM | POA: Diagnosis not present

## 2018-08-25 DIAGNOSIS — D649 Anemia, unspecified: Secondary | ICD-10-CM | POA: Diagnosis not present

## 2018-08-25 DIAGNOSIS — I10 Essential (primary) hypertension: Secondary | ICD-10-CM | POA: Diagnosis not present

## 2018-08-25 DIAGNOSIS — N183 Chronic kidney disease, stage 3 (moderate): Secondary | ICD-10-CM | POA: Diagnosis not present

## 2018-08-26 DIAGNOSIS — E039 Hypothyroidism, unspecified: Secondary | ICD-10-CM | POA: Diagnosis not present

## 2018-08-26 DIAGNOSIS — E785 Hyperlipidemia, unspecified: Secondary | ICD-10-CM | POA: Diagnosis not present

## 2018-08-26 DIAGNOSIS — I10 Essential (primary) hypertension: Secondary | ICD-10-CM | POA: Diagnosis not present

## 2018-08-26 DIAGNOSIS — R278 Other lack of coordination: Secondary | ICD-10-CM | POA: Diagnosis not present

## 2018-08-26 DIAGNOSIS — D649 Anemia, unspecified: Secondary | ICD-10-CM | POA: Diagnosis not present

## 2018-08-26 DIAGNOSIS — M6281 Muscle weakness (generalized): Secondary | ICD-10-CM | POA: Diagnosis not present

## 2018-08-26 DIAGNOSIS — N183 Chronic kidney disease, stage 3 (moderate): Secondary | ICD-10-CM | POA: Diagnosis not present

## 2018-08-26 DIAGNOSIS — J449 Chronic obstructive pulmonary disease, unspecified: Secondary | ICD-10-CM | POA: Diagnosis not present

## 2018-08-26 DIAGNOSIS — R2689 Other abnormalities of gait and mobility: Secondary | ICD-10-CM | POA: Diagnosis not present

## 2018-08-27 DIAGNOSIS — N183 Chronic kidney disease, stage 3 (moderate): Secondary | ICD-10-CM | POA: Diagnosis not present

## 2018-08-27 DIAGNOSIS — I5042 Chronic combined systolic (congestive) and diastolic (congestive) heart failure: Secondary | ICD-10-CM | POA: Diagnosis not present

## 2018-08-27 DIAGNOSIS — M6281 Muscle weakness (generalized): Secondary | ICD-10-CM | POA: Diagnosis not present

## 2018-08-27 DIAGNOSIS — R278 Other lack of coordination: Secondary | ICD-10-CM | POA: Diagnosis not present

## 2018-08-27 DIAGNOSIS — D649 Anemia, unspecified: Secondary | ICD-10-CM | POA: Diagnosis not present

## 2018-08-27 DIAGNOSIS — R634 Abnormal weight loss: Secondary | ICD-10-CM | POA: Diagnosis not present

## 2018-08-27 DIAGNOSIS — E039 Hypothyroidism, unspecified: Secondary | ICD-10-CM | POA: Diagnosis not present

## 2018-08-27 DIAGNOSIS — I10 Essential (primary) hypertension: Secondary | ICD-10-CM | POA: Diagnosis not present

## 2018-08-27 DIAGNOSIS — R2689 Other abnormalities of gait and mobility: Secondary | ICD-10-CM | POA: Diagnosis not present

## 2018-08-27 DIAGNOSIS — J449 Chronic obstructive pulmonary disease, unspecified: Secondary | ICD-10-CM | POA: Diagnosis not present

## 2018-08-27 DIAGNOSIS — E785 Hyperlipidemia, unspecified: Secondary | ICD-10-CM | POA: Diagnosis not present

## 2018-08-28 ENCOUNTER — Encounter: Payer: Self-pay | Admitting: Cardiology

## 2018-08-28 DIAGNOSIS — D649 Anemia, unspecified: Secondary | ICD-10-CM | POA: Diagnosis not present

## 2018-08-28 DIAGNOSIS — R0602 Shortness of breath: Secondary | ICD-10-CM | POA: Diagnosis not present

## 2018-08-28 DIAGNOSIS — I10 Essential (primary) hypertension: Secondary | ICD-10-CM | POA: Diagnosis not present

## 2018-08-28 DIAGNOSIS — Z79899 Other long term (current) drug therapy: Secondary | ICD-10-CM | POA: Diagnosis not present

## 2018-08-30 DIAGNOSIS — E785 Hyperlipidemia, unspecified: Secondary | ICD-10-CM | POA: Diagnosis not present

## 2018-08-30 DIAGNOSIS — J449 Chronic obstructive pulmonary disease, unspecified: Secondary | ICD-10-CM | POA: Diagnosis not present

## 2018-08-30 DIAGNOSIS — M6281 Muscle weakness (generalized): Secondary | ICD-10-CM | POA: Diagnosis not present

## 2018-08-30 DIAGNOSIS — N183 Chronic kidney disease, stage 3 (moderate): Secondary | ICD-10-CM | POA: Diagnosis not present

## 2018-08-30 DIAGNOSIS — E039 Hypothyroidism, unspecified: Secondary | ICD-10-CM | POA: Diagnosis not present

## 2018-08-30 DIAGNOSIS — R2689 Other abnormalities of gait and mobility: Secondary | ICD-10-CM | POA: Diagnosis not present

## 2018-08-30 DIAGNOSIS — D649 Anemia, unspecified: Secondary | ICD-10-CM | POA: Diagnosis not present

## 2018-08-30 DIAGNOSIS — R278 Other lack of coordination: Secondary | ICD-10-CM | POA: Diagnosis not present

## 2018-08-30 DIAGNOSIS — I10 Essential (primary) hypertension: Secondary | ICD-10-CM | POA: Diagnosis not present

## 2018-08-31 DIAGNOSIS — N183 Chronic kidney disease, stage 3 (moderate): Secondary | ICD-10-CM | POA: Diagnosis not present

## 2018-08-31 DIAGNOSIS — E785 Hyperlipidemia, unspecified: Secondary | ICD-10-CM | POA: Diagnosis not present

## 2018-08-31 DIAGNOSIS — E039 Hypothyroidism, unspecified: Secondary | ICD-10-CM | POA: Diagnosis not present

## 2018-08-31 DIAGNOSIS — R2689 Other abnormalities of gait and mobility: Secondary | ICD-10-CM | POA: Diagnosis not present

## 2018-08-31 DIAGNOSIS — M6281 Muscle weakness (generalized): Secondary | ICD-10-CM | POA: Diagnosis not present

## 2018-08-31 DIAGNOSIS — I10 Essential (primary) hypertension: Secondary | ICD-10-CM | POA: Diagnosis not present

## 2018-08-31 DIAGNOSIS — R278 Other lack of coordination: Secondary | ICD-10-CM | POA: Diagnosis not present

## 2018-08-31 DIAGNOSIS — D649 Anemia, unspecified: Secondary | ICD-10-CM | POA: Diagnosis not present

## 2018-08-31 DIAGNOSIS — J449 Chronic obstructive pulmonary disease, unspecified: Secondary | ICD-10-CM | POA: Diagnosis not present

## 2018-09-01 DIAGNOSIS — M6281 Muscle weakness (generalized): Secondary | ICD-10-CM | POA: Diagnosis not present

## 2018-09-01 DIAGNOSIS — E039 Hypothyroidism, unspecified: Secondary | ICD-10-CM | POA: Diagnosis not present

## 2018-09-01 DIAGNOSIS — R2689 Other abnormalities of gait and mobility: Secondary | ICD-10-CM | POA: Diagnosis not present

## 2018-09-01 DIAGNOSIS — E785 Hyperlipidemia, unspecified: Secondary | ICD-10-CM | POA: Diagnosis not present

## 2018-09-01 DIAGNOSIS — D649 Anemia, unspecified: Secondary | ICD-10-CM | POA: Diagnosis not present

## 2018-09-01 DIAGNOSIS — R278 Other lack of coordination: Secondary | ICD-10-CM | POA: Diagnosis not present

## 2018-09-01 DIAGNOSIS — N183 Chronic kidney disease, stage 3 (moderate): Secondary | ICD-10-CM | POA: Diagnosis not present

## 2018-09-01 DIAGNOSIS — I10 Essential (primary) hypertension: Secondary | ICD-10-CM | POA: Diagnosis not present

## 2018-09-01 DIAGNOSIS — J449 Chronic obstructive pulmonary disease, unspecified: Secondary | ICD-10-CM | POA: Diagnosis not present

## 2018-09-02 DIAGNOSIS — E039 Hypothyroidism, unspecified: Secondary | ICD-10-CM | POA: Diagnosis not present

## 2018-09-02 DIAGNOSIS — E785 Hyperlipidemia, unspecified: Secondary | ICD-10-CM | POA: Diagnosis not present

## 2018-09-02 DIAGNOSIS — I739 Peripheral vascular disease, unspecified: Secondary | ICD-10-CM | POA: Diagnosis not present

## 2018-09-02 DIAGNOSIS — Q845 Enlarged and hypertrophic nails: Secondary | ICD-10-CM | POA: Diagnosis not present

## 2018-09-02 DIAGNOSIS — R2689 Other abnormalities of gait and mobility: Secondary | ICD-10-CM | POA: Diagnosis not present

## 2018-09-02 DIAGNOSIS — B351 Tinea unguium: Secondary | ICD-10-CM | POA: Diagnosis not present

## 2018-09-02 DIAGNOSIS — J449 Chronic obstructive pulmonary disease, unspecified: Secondary | ICD-10-CM | POA: Diagnosis not present

## 2018-09-02 DIAGNOSIS — R278 Other lack of coordination: Secondary | ICD-10-CM | POA: Diagnosis not present

## 2018-09-02 DIAGNOSIS — I10 Essential (primary) hypertension: Secondary | ICD-10-CM | POA: Diagnosis not present

## 2018-09-02 DIAGNOSIS — L603 Nail dystrophy: Secondary | ICD-10-CM | POA: Diagnosis not present

## 2018-09-02 DIAGNOSIS — D649 Anemia, unspecified: Secondary | ICD-10-CM | POA: Diagnosis not present

## 2018-09-02 DIAGNOSIS — M6281 Muscle weakness (generalized): Secondary | ICD-10-CM | POA: Diagnosis not present

## 2018-09-02 DIAGNOSIS — N183 Chronic kidney disease, stage 3 (moderate): Secondary | ICD-10-CM | POA: Diagnosis not present

## 2018-09-03 DIAGNOSIS — J449 Chronic obstructive pulmonary disease, unspecified: Secondary | ICD-10-CM | POA: Diagnosis not present

## 2018-09-03 DIAGNOSIS — R2689 Other abnormalities of gait and mobility: Secondary | ICD-10-CM | POA: Diagnosis not present

## 2018-09-03 DIAGNOSIS — I10 Essential (primary) hypertension: Secondary | ICD-10-CM | POA: Diagnosis not present

## 2018-09-03 DIAGNOSIS — R278 Other lack of coordination: Secondary | ICD-10-CM | POA: Diagnosis not present

## 2018-09-03 DIAGNOSIS — E039 Hypothyroidism, unspecified: Secondary | ICD-10-CM | POA: Diagnosis not present

## 2018-09-03 DIAGNOSIS — E785 Hyperlipidemia, unspecified: Secondary | ICD-10-CM | POA: Diagnosis not present

## 2018-09-03 DIAGNOSIS — D649 Anemia, unspecified: Secondary | ICD-10-CM | POA: Diagnosis not present

## 2018-09-03 DIAGNOSIS — M6281 Muscle weakness (generalized): Secondary | ICD-10-CM | POA: Diagnosis not present

## 2018-09-03 DIAGNOSIS — N183 Chronic kidney disease, stage 3 (moderate): Secondary | ICD-10-CM | POA: Diagnosis not present

## 2018-09-05 DIAGNOSIS — M6281 Muscle weakness (generalized): Secondary | ICD-10-CM | POA: Diagnosis not present

## 2018-09-05 DIAGNOSIS — E039 Hypothyroidism, unspecified: Secondary | ICD-10-CM | POA: Diagnosis not present

## 2018-09-05 DIAGNOSIS — I10 Essential (primary) hypertension: Secondary | ICD-10-CM | POA: Diagnosis not present

## 2018-09-05 DIAGNOSIS — E785 Hyperlipidemia, unspecified: Secondary | ICD-10-CM | POA: Diagnosis not present

## 2018-09-05 DIAGNOSIS — D649 Anemia, unspecified: Secondary | ICD-10-CM | POA: Diagnosis not present

## 2018-09-05 DIAGNOSIS — N183 Chronic kidney disease, stage 3 (moderate): Secondary | ICD-10-CM | POA: Diagnosis not present

## 2018-09-05 DIAGNOSIS — J449 Chronic obstructive pulmonary disease, unspecified: Secondary | ICD-10-CM | POA: Diagnosis not present

## 2018-09-05 DIAGNOSIS — R278 Other lack of coordination: Secondary | ICD-10-CM | POA: Diagnosis not present

## 2018-09-05 DIAGNOSIS — R2689 Other abnormalities of gait and mobility: Secondary | ICD-10-CM | POA: Diagnosis not present

## 2018-09-10 DIAGNOSIS — D649 Anemia, unspecified: Secondary | ICD-10-CM | POA: Diagnosis not present

## 2018-09-10 DIAGNOSIS — F0390 Unspecified dementia without behavioral disturbance: Secondary | ICD-10-CM | POA: Diagnosis not present

## 2018-09-10 DIAGNOSIS — E039 Hypothyroidism, unspecified: Secondary | ICD-10-CM | POA: Diagnosis not present

## 2018-09-10 DIAGNOSIS — I10 Essential (primary) hypertension: Secondary | ICD-10-CM | POA: Diagnosis not present

## 2018-09-10 DIAGNOSIS — N183 Chronic kidney disease, stage 3 (moderate): Secondary | ICD-10-CM | POA: Diagnosis not present

## 2018-09-10 DIAGNOSIS — I509 Heart failure, unspecified: Secondary | ICD-10-CM | POA: Diagnosis not present

## 2018-09-23 DIAGNOSIS — D649 Anemia, unspecified: Secondary | ICD-10-CM | POA: Diagnosis not present

## 2018-09-23 DIAGNOSIS — E039 Hypothyroidism, unspecified: Secondary | ICD-10-CM | POA: Diagnosis not present

## 2018-09-23 DIAGNOSIS — R2689 Other abnormalities of gait and mobility: Secondary | ICD-10-CM | POA: Diagnosis not present

## 2018-09-23 DIAGNOSIS — M6281 Muscle weakness (generalized): Secondary | ICD-10-CM | POA: Diagnosis not present

## 2018-09-23 DIAGNOSIS — J449 Chronic obstructive pulmonary disease, unspecified: Secondary | ICD-10-CM | POA: Diagnosis not present

## 2018-09-23 DIAGNOSIS — E785 Hyperlipidemia, unspecified: Secondary | ICD-10-CM | POA: Diagnosis not present

## 2018-09-23 DIAGNOSIS — R278 Other lack of coordination: Secondary | ICD-10-CM | POA: Diagnosis not present

## 2018-09-23 DIAGNOSIS — N183 Chronic kidney disease, stage 3 (moderate): Secondary | ICD-10-CM | POA: Diagnosis not present

## 2018-09-23 DIAGNOSIS — I10 Essential (primary) hypertension: Secondary | ICD-10-CM | POA: Diagnosis not present

## 2018-09-24 DIAGNOSIS — D649 Anemia, unspecified: Secondary | ICD-10-CM | POA: Diagnosis not present

## 2018-09-24 DIAGNOSIS — R278 Other lack of coordination: Secondary | ICD-10-CM | POA: Diagnosis not present

## 2018-09-24 DIAGNOSIS — E039 Hypothyroidism, unspecified: Secondary | ICD-10-CM | POA: Diagnosis not present

## 2018-09-24 DIAGNOSIS — R2689 Other abnormalities of gait and mobility: Secondary | ICD-10-CM | POA: Diagnosis not present

## 2018-09-24 DIAGNOSIS — N183 Chronic kidney disease, stage 3 (moderate): Secondary | ICD-10-CM | POA: Diagnosis not present

## 2018-09-24 DIAGNOSIS — I10 Essential (primary) hypertension: Secondary | ICD-10-CM | POA: Diagnosis not present

## 2018-09-24 DIAGNOSIS — M6281 Muscle weakness (generalized): Secondary | ICD-10-CM | POA: Diagnosis not present

## 2018-09-24 DIAGNOSIS — J449 Chronic obstructive pulmonary disease, unspecified: Secondary | ICD-10-CM | POA: Diagnosis not present

## 2018-09-24 DIAGNOSIS — E785 Hyperlipidemia, unspecified: Secondary | ICD-10-CM | POA: Diagnosis not present

## 2018-09-25 DIAGNOSIS — I10 Essential (primary) hypertension: Secondary | ICD-10-CM | POA: Diagnosis not present

## 2018-09-25 DIAGNOSIS — D649 Anemia, unspecified: Secondary | ICD-10-CM | POA: Diagnosis not present

## 2018-09-25 DIAGNOSIS — M6281 Muscle weakness (generalized): Secondary | ICD-10-CM | POA: Diagnosis not present

## 2018-09-25 DIAGNOSIS — N183 Chronic kidney disease, stage 3 (moderate): Secondary | ICD-10-CM | POA: Diagnosis not present

## 2018-09-25 DIAGNOSIS — J449 Chronic obstructive pulmonary disease, unspecified: Secondary | ICD-10-CM | POA: Diagnosis not present

## 2018-09-25 DIAGNOSIS — R2689 Other abnormalities of gait and mobility: Secondary | ICD-10-CM | POA: Diagnosis not present

## 2018-09-25 DIAGNOSIS — E039 Hypothyroidism, unspecified: Secondary | ICD-10-CM | POA: Diagnosis not present

## 2018-09-25 DIAGNOSIS — E785 Hyperlipidemia, unspecified: Secondary | ICD-10-CM | POA: Diagnosis not present

## 2018-09-25 DIAGNOSIS — R278 Other lack of coordination: Secondary | ICD-10-CM | POA: Diagnosis not present

## 2018-09-26 DIAGNOSIS — I10 Essential (primary) hypertension: Secondary | ICD-10-CM | POA: Diagnosis not present

## 2018-09-26 DIAGNOSIS — M6281 Muscle weakness (generalized): Secondary | ICD-10-CM | POA: Diagnosis not present

## 2018-09-26 DIAGNOSIS — R2689 Other abnormalities of gait and mobility: Secondary | ICD-10-CM | POA: Diagnosis not present

## 2018-09-26 DIAGNOSIS — E039 Hypothyroidism, unspecified: Secondary | ICD-10-CM | POA: Diagnosis not present

## 2018-09-26 DIAGNOSIS — D649 Anemia, unspecified: Secondary | ICD-10-CM | POA: Diagnosis not present

## 2018-09-26 DIAGNOSIS — N183 Chronic kidney disease, stage 3 (moderate): Secondary | ICD-10-CM | POA: Diagnosis not present

## 2018-09-26 DIAGNOSIS — J449 Chronic obstructive pulmonary disease, unspecified: Secondary | ICD-10-CM | POA: Diagnosis not present

## 2018-09-26 DIAGNOSIS — E785 Hyperlipidemia, unspecified: Secondary | ICD-10-CM | POA: Diagnosis not present

## 2018-09-26 DIAGNOSIS — R278 Other lack of coordination: Secondary | ICD-10-CM | POA: Diagnosis not present

## 2018-09-27 DIAGNOSIS — R2689 Other abnormalities of gait and mobility: Secondary | ICD-10-CM | POA: Diagnosis not present

## 2018-09-27 DIAGNOSIS — M6281 Muscle weakness (generalized): Secondary | ICD-10-CM | POA: Diagnosis not present

## 2018-09-27 DIAGNOSIS — N183 Chronic kidney disease, stage 3 (moderate): Secondary | ICD-10-CM | POA: Diagnosis not present

## 2018-09-27 DIAGNOSIS — E785 Hyperlipidemia, unspecified: Secondary | ICD-10-CM | POA: Diagnosis not present

## 2018-09-27 DIAGNOSIS — J449 Chronic obstructive pulmonary disease, unspecified: Secondary | ICD-10-CM | POA: Diagnosis not present

## 2018-09-27 DIAGNOSIS — I10 Essential (primary) hypertension: Secondary | ICD-10-CM | POA: Diagnosis not present

## 2018-09-27 DIAGNOSIS — D649 Anemia, unspecified: Secondary | ICD-10-CM | POA: Diagnosis not present

## 2018-09-27 DIAGNOSIS — E039 Hypothyroidism, unspecified: Secondary | ICD-10-CM | POA: Diagnosis not present

## 2018-09-27 DIAGNOSIS — R278 Other lack of coordination: Secondary | ICD-10-CM | POA: Diagnosis not present

## 2018-09-28 ENCOUNTER — Encounter: Payer: Self-pay | Admitting: Cardiology

## 2018-09-28 DIAGNOSIS — E559 Vitamin D deficiency, unspecified: Secondary | ICD-10-CM | POA: Diagnosis not present

## 2018-09-29 DIAGNOSIS — I5042 Chronic combined systolic (congestive) and diastolic (congestive) heart failure: Secondary | ICD-10-CM | POA: Diagnosis not present

## 2018-09-29 DIAGNOSIS — S80811D Abrasion, right lower leg, subsequent encounter: Secondary | ICD-10-CM | POA: Diagnosis not present

## 2018-09-29 DIAGNOSIS — I10 Essential (primary) hypertension: Secondary | ICD-10-CM | POA: Diagnosis not present

## 2018-09-29 DIAGNOSIS — F331 Major depressive disorder, recurrent, moderate: Secondary | ICD-10-CM | POA: Diagnosis not present

## 2018-09-29 DIAGNOSIS — N183 Chronic kidney disease, stage 3 (moderate): Secondary | ICD-10-CM | POA: Diagnosis not present

## 2018-10-01 DIAGNOSIS — R2689 Other abnormalities of gait and mobility: Secondary | ICD-10-CM | POA: Diagnosis not present

## 2018-10-01 DIAGNOSIS — M6281 Muscle weakness (generalized): Secondary | ICD-10-CM | POA: Diagnosis not present

## 2018-10-01 DIAGNOSIS — E785 Hyperlipidemia, unspecified: Secondary | ICD-10-CM | POA: Diagnosis not present

## 2018-10-01 DIAGNOSIS — E039 Hypothyroidism, unspecified: Secondary | ICD-10-CM | POA: Diagnosis not present

## 2018-10-01 DIAGNOSIS — J449 Chronic obstructive pulmonary disease, unspecified: Secondary | ICD-10-CM | POA: Diagnosis not present

## 2018-10-01 DIAGNOSIS — I10 Essential (primary) hypertension: Secondary | ICD-10-CM | POA: Diagnosis not present

## 2018-10-01 DIAGNOSIS — R278 Other lack of coordination: Secondary | ICD-10-CM | POA: Diagnosis not present

## 2018-10-01 DIAGNOSIS — D649 Anemia, unspecified: Secondary | ICD-10-CM | POA: Diagnosis not present

## 2018-10-01 DIAGNOSIS — N183 Chronic kidney disease, stage 3 (moderate): Secondary | ICD-10-CM | POA: Diagnosis not present

## 2018-10-02 DIAGNOSIS — I10 Essential (primary) hypertension: Secondary | ICD-10-CM | POA: Diagnosis not present

## 2018-10-02 DIAGNOSIS — R278 Other lack of coordination: Secondary | ICD-10-CM | POA: Diagnosis not present

## 2018-10-02 DIAGNOSIS — N183 Chronic kidney disease, stage 3 (moderate): Secondary | ICD-10-CM | POA: Diagnosis not present

## 2018-10-02 DIAGNOSIS — J449 Chronic obstructive pulmonary disease, unspecified: Secondary | ICD-10-CM | POA: Diagnosis not present

## 2018-10-02 DIAGNOSIS — D649 Anemia, unspecified: Secondary | ICD-10-CM | POA: Diagnosis not present

## 2018-10-02 DIAGNOSIS — M6281 Muscle weakness (generalized): Secondary | ICD-10-CM | POA: Diagnosis not present

## 2018-10-02 DIAGNOSIS — E785 Hyperlipidemia, unspecified: Secondary | ICD-10-CM | POA: Diagnosis not present

## 2018-10-02 DIAGNOSIS — R2689 Other abnormalities of gait and mobility: Secondary | ICD-10-CM | POA: Diagnosis not present

## 2018-10-02 DIAGNOSIS — E039 Hypothyroidism, unspecified: Secondary | ICD-10-CM | POA: Diagnosis not present

## 2018-10-04 DIAGNOSIS — I10 Essential (primary) hypertension: Secondary | ICD-10-CM | POA: Diagnosis not present

## 2018-10-04 DIAGNOSIS — N183 Chronic kidney disease, stage 3 (moderate): Secondary | ICD-10-CM | POA: Diagnosis not present

## 2018-10-04 DIAGNOSIS — D649 Anemia, unspecified: Secondary | ICD-10-CM | POA: Diagnosis not present

## 2018-10-04 DIAGNOSIS — E785 Hyperlipidemia, unspecified: Secondary | ICD-10-CM | POA: Diagnosis not present

## 2018-10-04 DIAGNOSIS — M6281 Muscle weakness (generalized): Secondary | ICD-10-CM | POA: Diagnosis not present

## 2018-10-04 DIAGNOSIS — R2689 Other abnormalities of gait and mobility: Secondary | ICD-10-CM | POA: Diagnosis not present

## 2018-10-04 DIAGNOSIS — J449 Chronic obstructive pulmonary disease, unspecified: Secondary | ICD-10-CM | POA: Diagnosis not present

## 2018-10-04 DIAGNOSIS — R278 Other lack of coordination: Secondary | ICD-10-CM | POA: Diagnosis not present

## 2018-10-04 DIAGNOSIS — E039 Hypothyroidism, unspecified: Secondary | ICD-10-CM | POA: Diagnosis not present

## 2018-10-05 DIAGNOSIS — E039 Hypothyroidism, unspecified: Secondary | ICD-10-CM | POA: Diagnosis not present

## 2018-10-05 DIAGNOSIS — R278 Other lack of coordination: Secondary | ICD-10-CM | POA: Diagnosis not present

## 2018-10-05 DIAGNOSIS — F338 Other recurrent depressive disorders: Secondary | ICD-10-CM | POA: Diagnosis not present

## 2018-10-05 DIAGNOSIS — N183 Chronic kidney disease, stage 3 (moderate): Secondary | ICD-10-CM | POA: Diagnosis not present

## 2018-10-05 DIAGNOSIS — R2689 Other abnormalities of gait and mobility: Secondary | ICD-10-CM | POA: Diagnosis not present

## 2018-10-05 DIAGNOSIS — I10 Essential (primary) hypertension: Secondary | ICD-10-CM | POA: Diagnosis not present

## 2018-10-05 DIAGNOSIS — E785 Hyperlipidemia, unspecified: Secondary | ICD-10-CM | POA: Diagnosis not present

## 2018-10-05 DIAGNOSIS — J449 Chronic obstructive pulmonary disease, unspecified: Secondary | ICD-10-CM | POA: Diagnosis not present

## 2018-10-05 DIAGNOSIS — M6281 Muscle weakness (generalized): Secondary | ICD-10-CM | POA: Diagnosis not present

## 2018-10-05 DIAGNOSIS — D649 Anemia, unspecified: Secondary | ICD-10-CM | POA: Diagnosis not present

## 2018-10-06 DIAGNOSIS — E039 Hypothyroidism, unspecified: Secondary | ICD-10-CM | POA: Diagnosis not present

## 2018-10-06 DIAGNOSIS — N183 Chronic kidney disease, stage 3 (moderate): Secondary | ICD-10-CM | POA: Diagnosis not present

## 2018-10-06 DIAGNOSIS — E785 Hyperlipidemia, unspecified: Secondary | ICD-10-CM | POA: Diagnosis not present

## 2018-10-06 DIAGNOSIS — R278 Other lack of coordination: Secondary | ICD-10-CM | POA: Diagnosis not present

## 2018-10-06 DIAGNOSIS — J449 Chronic obstructive pulmonary disease, unspecified: Secondary | ICD-10-CM | POA: Diagnosis not present

## 2018-10-06 DIAGNOSIS — I10 Essential (primary) hypertension: Secondary | ICD-10-CM | POA: Diagnosis not present

## 2018-10-06 DIAGNOSIS — M6281 Muscle weakness (generalized): Secondary | ICD-10-CM | POA: Diagnosis not present

## 2018-10-06 DIAGNOSIS — D649 Anemia, unspecified: Secondary | ICD-10-CM | POA: Diagnosis not present

## 2018-10-06 DIAGNOSIS — R2689 Other abnormalities of gait and mobility: Secondary | ICD-10-CM | POA: Diagnosis not present

## 2018-10-07 DIAGNOSIS — R2689 Other abnormalities of gait and mobility: Secondary | ICD-10-CM | POA: Diagnosis not present

## 2018-10-07 DIAGNOSIS — D649 Anemia, unspecified: Secondary | ICD-10-CM | POA: Diagnosis not present

## 2018-10-07 DIAGNOSIS — N183 Chronic kidney disease, stage 3 (moderate): Secondary | ICD-10-CM | POA: Diagnosis not present

## 2018-10-07 DIAGNOSIS — E785 Hyperlipidemia, unspecified: Secondary | ICD-10-CM | POA: Diagnosis not present

## 2018-10-07 DIAGNOSIS — R278 Other lack of coordination: Secondary | ICD-10-CM | POA: Diagnosis not present

## 2018-10-07 DIAGNOSIS — M6281 Muscle weakness (generalized): Secondary | ICD-10-CM | POA: Diagnosis not present

## 2018-10-07 DIAGNOSIS — E039 Hypothyroidism, unspecified: Secondary | ICD-10-CM | POA: Diagnosis not present

## 2018-10-07 DIAGNOSIS — J449 Chronic obstructive pulmonary disease, unspecified: Secondary | ICD-10-CM | POA: Diagnosis not present

## 2018-10-07 DIAGNOSIS — I10 Essential (primary) hypertension: Secondary | ICD-10-CM | POA: Diagnosis not present

## 2018-10-08 DIAGNOSIS — E785 Hyperlipidemia, unspecified: Secondary | ICD-10-CM | POA: Diagnosis not present

## 2018-10-08 DIAGNOSIS — N183 Chronic kidney disease, stage 3 (moderate): Secondary | ICD-10-CM | POA: Diagnosis not present

## 2018-10-08 DIAGNOSIS — I10 Essential (primary) hypertension: Secondary | ICD-10-CM | POA: Diagnosis not present

## 2018-10-08 DIAGNOSIS — R2689 Other abnormalities of gait and mobility: Secondary | ICD-10-CM | POA: Diagnosis not present

## 2018-10-08 DIAGNOSIS — M6281 Muscle weakness (generalized): Secondary | ICD-10-CM | POA: Diagnosis not present

## 2018-10-08 DIAGNOSIS — J449 Chronic obstructive pulmonary disease, unspecified: Secondary | ICD-10-CM | POA: Diagnosis not present

## 2018-10-08 DIAGNOSIS — R278 Other lack of coordination: Secondary | ICD-10-CM | POA: Diagnosis not present

## 2018-10-08 DIAGNOSIS — E039 Hypothyroidism, unspecified: Secondary | ICD-10-CM | POA: Diagnosis not present

## 2018-10-08 DIAGNOSIS — D649 Anemia, unspecified: Secondary | ICD-10-CM | POA: Diagnosis not present

## 2018-10-11 DIAGNOSIS — R2689 Other abnormalities of gait and mobility: Secondary | ICD-10-CM | POA: Diagnosis not present

## 2018-10-11 DIAGNOSIS — I10 Essential (primary) hypertension: Secondary | ICD-10-CM | POA: Diagnosis not present

## 2018-10-11 DIAGNOSIS — R278 Other lack of coordination: Secondary | ICD-10-CM | POA: Diagnosis not present

## 2018-10-11 DIAGNOSIS — M6281 Muscle weakness (generalized): Secondary | ICD-10-CM | POA: Diagnosis not present

## 2018-10-11 DIAGNOSIS — N183 Chronic kidney disease, stage 3 (moderate): Secondary | ICD-10-CM | POA: Diagnosis not present

## 2018-10-11 DIAGNOSIS — E039 Hypothyroidism, unspecified: Secondary | ICD-10-CM | POA: Diagnosis not present

## 2018-10-11 DIAGNOSIS — J449 Chronic obstructive pulmonary disease, unspecified: Secondary | ICD-10-CM | POA: Diagnosis not present

## 2018-10-11 DIAGNOSIS — E785 Hyperlipidemia, unspecified: Secondary | ICD-10-CM | POA: Diagnosis not present

## 2018-10-11 DIAGNOSIS — D649 Anemia, unspecified: Secondary | ICD-10-CM | POA: Diagnosis not present

## 2018-10-12 DIAGNOSIS — E039 Hypothyroidism, unspecified: Secondary | ICD-10-CM | POA: Diagnosis not present

## 2018-10-12 DIAGNOSIS — M6281 Muscle weakness (generalized): Secondary | ICD-10-CM | POA: Diagnosis not present

## 2018-10-12 DIAGNOSIS — R278 Other lack of coordination: Secondary | ICD-10-CM | POA: Diagnosis not present

## 2018-10-12 DIAGNOSIS — J449 Chronic obstructive pulmonary disease, unspecified: Secondary | ICD-10-CM | POA: Diagnosis not present

## 2018-10-12 DIAGNOSIS — D649 Anemia, unspecified: Secondary | ICD-10-CM | POA: Diagnosis not present

## 2018-10-12 DIAGNOSIS — N183 Chronic kidney disease, stage 3 (moderate): Secondary | ICD-10-CM | POA: Diagnosis not present

## 2018-10-12 DIAGNOSIS — E785 Hyperlipidemia, unspecified: Secondary | ICD-10-CM | POA: Diagnosis not present

## 2018-10-12 DIAGNOSIS — R2689 Other abnormalities of gait and mobility: Secondary | ICD-10-CM | POA: Diagnosis not present

## 2018-10-12 DIAGNOSIS — I10 Essential (primary) hypertension: Secondary | ICD-10-CM | POA: Diagnosis not present

## 2018-10-13 DIAGNOSIS — F338 Other recurrent depressive disorders: Secondary | ICD-10-CM | POA: Diagnosis not present

## 2018-10-13 DIAGNOSIS — I10 Essential (primary) hypertension: Secondary | ICD-10-CM | POA: Diagnosis not present

## 2018-10-13 DIAGNOSIS — M6281 Muscle weakness (generalized): Secondary | ICD-10-CM | POA: Diagnosis not present

## 2018-10-13 DIAGNOSIS — D649 Anemia, unspecified: Secondary | ICD-10-CM | POA: Diagnosis not present

## 2018-10-13 DIAGNOSIS — E039 Hypothyroidism, unspecified: Secondary | ICD-10-CM | POA: Diagnosis not present

## 2018-10-13 DIAGNOSIS — R278 Other lack of coordination: Secondary | ICD-10-CM | POA: Diagnosis not present

## 2018-10-13 DIAGNOSIS — R2689 Other abnormalities of gait and mobility: Secondary | ICD-10-CM | POA: Diagnosis not present

## 2018-10-13 DIAGNOSIS — N183 Chronic kidney disease, stage 3 (moderate): Secondary | ICD-10-CM | POA: Diagnosis not present

## 2018-10-13 DIAGNOSIS — E785 Hyperlipidemia, unspecified: Secondary | ICD-10-CM | POA: Diagnosis not present

## 2018-10-13 DIAGNOSIS — J449 Chronic obstructive pulmonary disease, unspecified: Secondary | ICD-10-CM | POA: Diagnosis not present

## 2018-10-14 DIAGNOSIS — R2689 Other abnormalities of gait and mobility: Secondary | ICD-10-CM | POA: Diagnosis not present

## 2018-10-14 DIAGNOSIS — J449 Chronic obstructive pulmonary disease, unspecified: Secondary | ICD-10-CM | POA: Diagnosis not present

## 2018-10-14 DIAGNOSIS — R278 Other lack of coordination: Secondary | ICD-10-CM | POA: Diagnosis not present

## 2018-10-14 DIAGNOSIS — I10 Essential (primary) hypertension: Secondary | ICD-10-CM | POA: Diagnosis not present

## 2018-10-14 DIAGNOSIS — D649 Anemia, unspecified: Secondary | ICD-10-CM | POA: Diagnosis not present

## 2018-10-14 DIAGNOSIS — E785 Hyperlipidemia, unspecified: Secondary | ICD-10-CM | POA: Diagnosis not present

## 2018-10-14 DIAGNOSIS — E039 Hypothyroidism, unspecified: Secondary | ICD-10-CM | POA: Diagnosis not present

## 2018-10-14 DIAGNOSIS — M6281 Muscle weakness (generalized): Secondary | ICD-10-CM | POA: Diagnosis not present

## 2018-10-14 DIAGNOSIS — N183 Chronic kidney disease, stage 3 (moderate): Secondary | ICD-10-CM | POA: Diagnosis not present

## 2018-10-15 DIAGNOSIS — J449 Chronic obstructive pulmonary disease, unspecified: Secondary | ICD-10-CM | POA: Diagnosis not present

## 2018-10-15 DIAGNOSIS — N183 Chronic kidney disease, stage 3 (moderate): Secondary | ICD-10-CM | POA: Diagnosis not present

## 2018-10-15 DIAGNOSIS — M6281 Muscle weakness (generalized): Secondary | ICD-10-CM | POA: Diagnosis not present

## 2018-10-15 DIAGNOSIS — E785 Hyperlipidemia, unspecified: Secondary | ICD-10-CM | POA: Diagnosis not present

## 2018-10-15 DIAGNOSIS — R278 Other lack of coordination: Secondary | ICD-10-CM | POA: Diagnosis not present

## 2018-10-15 DIAGNOSIS — I10 Essential (primary) hypertension: Secondary | ICD-10-CM | POA: Diagnosis not present

## 2018-10-15 DIAGNOSIS — E039 Hypothyroidism, unspecified: Secondary | ICD-10-CM | POA: Diagnosis not present

## 2018-10-15 DIAGNOSIS — D649 Anemia, unspecified: Secondary | ICD-10-CM | POA: Diagnosis not present

## 2018-10-15 DIAGNOSIS — R2689 Other abnormalities of gait and mobility: Secondary | ICD-10-CM | POA: Diagnosis not present

## 2018-10-18 DIAGNOSIS — R2689 Other abnormalities of gait and mobility: Secondary | ICD-10-CM | POA: Diagnosis not present

## 2018-10-18 DIAGNOSIS — N183 Chronic kidney disease, stage 3 (moderate): Secondary | ICD-10-CM | POA: Diagnosis not present

## 2018-10-18 DIAGNOSIS — M6281 Muscle weakness (generalized): Secondary | ICD-10-CM | POA: Diagnosis not present

## 2018-10-18 DIAGNOSIS — E039 Hypothyroidism, unspecified: Secondary | ICD-10-CM | POA: Diagnosis not present

## 2018-10-18 DIAGNOSIS — D649 Anemia, unspecified: Secondary | ICD-10-CM | POA: Diagnosis not present

## 2018-10-18 DIAGNOSIS — I10 Essential (primary) hypertension: Secondary | ICD-10-CM | POA: Diagnosis not present

## 2018-10-18 DIAGNOSIS — J449 Chronic obstructive pulmonary disease, unspecified: Secondary | ICD-10-CM | POA: Diagnosis not present

## 2018-10-18 DIAGNOSIS — E785 Hyperlipidemia, unspecified: Secondary | ICD-10-CM | POA: Diagnosis not present

## 2018-10-18 DIAGNOSIS — R278 Other lack of coordination: Secondary | ICD-10-CM | POA: Diagnosis not present

## 2018-10-19 DIAGNOSIS — J449 Chronic obstructive pulmonary disease, unspecified: Secondary | ICD-10-CM | POA: Diagnosis not present

## 2018-10-19 DIAGNOSIS — E039 Hypothyroidism, unspecified: Secondary | ICD-10-CM | POA: Diagnosis not present

## 2018-10-19 DIAGNOSIS — M6281 Muscle weakness (generalized): Secondary | ICD-10-CM | POA: Diagnosis not present

## 2018-10-19 DIAGNOSIS — D649 Anemia, unspecified: Secondary | ICD-10-CM | POA: Diagnosis not present

## 2018-10-19 DIAGNOSIS — R2689 Other abnormalities of gait and mobility: Secondary | ICD-10-CM | POA: Diagnosis not present

## 2018-10-19 DIAGNOSIS — I10 Essential (primary) hypertension: Secondary | ICD-10-CM | POA: Diagnosis not present

## 2018-10-19 DIAGNOSIS — R278 Other lack of coordination: Secondary | ICD-10-CM | POA: Diagnosis not present

## 2018-10-19 DIAGNOSIS — E785 Hyperlipidemia, unspecified: Secondary | ICD-10-CM | POA: Diagnosis not present

## 2018-10-19 DIAGNOSIS — N183 Chronic kidney disease, stage 3 (moderate): Secondary | ICD-10-CM | POA: Diagnosis not present

## 2018-10-19 DIAGNOSIS — F338 Other recurrent depressive disorders: Secondary | ICD-10-CM | POA: Diagnosis not present

## 2018-10-20 DIAGNOSIS — M6281 Muscle weakness (generalized): Secondary | ICD-10-CM | POA: Diagnosis not present

## 2018-10-20 DIAGNOSIS — N183 Chronic kidney disease, stage 3 (moderate): Secondary | ICD-10-CM | POA: Diagnosis not present

## 2018-10-20 DIAGNOSIS — R278 Other lack of coordination: Secondary | ICD-10-CM | POA: Diagnosis not present

## 2018-10-20 DIAGNOSIS — D649 Anemia, unspecified: Secondary | ICD-10-CM | POA: Diagnosis not present

## 2018-10-20 DIAGNOSIS — E039 Hypothyroidism, unspecified: Secondary | ICD-10-CM | POA: Diagnosis not present

## 2018-10-20 DIAGNOSIS — E785 Hyperlipidemia, unspecified: Secondary | ICD-10-CM | POA: Diagnosis not present

## 2018-10-20 DIAGNOSIS — I10 Essential (primary) hypertension: Secondary | ICD-10-CM | POA: Diagnosis not present

## 2018-10-20 DIAGNOSIS — J449 Chronic obstructive pulmonary disease, unspecified: Secondary | ICD-10-CM | POA: Diagnosis not present

## 2018-10-20 DIAGNOSIS — R2689 Other abnormalities of gait and mobility: Secondary | ICD-10-CM | POA: Diagnosis not present

## 2018-10-26 DIAGNOSIS — E039 Hypothyroidism, unspecified: Secondary | ICD-10-CM | POA: Diagnosis not present

## 2018-10-26 DIAGNOSIS — N183 Chronic kidney disease, stage 3 (moderate): Secondary | ICD-10-CM | POA: Diagnosis not present

## 2018-10-26 DIAGNOSIS — R600 Localized edema: Secondary | ICD-10-CM | POA: Diagnosis not present

## 2018-10-26 DIAGNOSIS — I509 Heart failure, unspecified: Secondary | ICD-10-CM | POA: Diagnosis not present

## 2018-10-26 DIAGNOSIS — D649 Anemia, unspecified: Secondary | ICD-10-CM | POA: Diagnosis not present

## 2018-10-26 DIAGNOSIS — I872 Venous insufficiency (chronic) (peripheral): Secondary | ICD-10-CM | POA: Diagnosis not present

## 2018-10-26 DIAGNOSIS — F0390 Unspecified dementia without behavioral disturbance: Secondary | ICD-10-CM | POA: Diagnosis not present

## 2018-10-27 DIAGNOSIS — F331 Major depressive disorder, recurrent, moderate: Secondary | ICD-10-CM | POA: Diagnosis not present

## 2018-11-01 ENCOUNTER — Encounter: Payer: Self-pay | Admitting: Cardiology

## 2018-11-01 ENCOUNTER — Ambulatory Visit (INDEPENDENT_AMBULATORY_CARE_PROVIDER_SITE_OTHER): Payer: Medicaid Other | Admitting: Cardiology

## 2018-11-01 VITALS — BP 119/75 | HR 88 | Ht 72.0 in | Wt 297.0 lb

## 2018-11-01 DIAGNOSIS — Z7189 Other specified counseling: Secondary | ICD-10-CM | POA: Diagnosis not present

## 2018-11-01 DIAGNOSIS — I5032 Chronic diastolic (congestive) heart failure: Secondary | ICD-10-CM

## 2018-11-01 DIAGNOSIS — E559 Vitamin D deficiency, unspecified: Secondary | ICD-10-CM | POA: Diagnosis not present

## 2018-11-01 DIAGNOSIS — Z6841 Body Mass Index (BMI) 40.0 and over, adult: Secondary | ICD-10-CM | POA: Diagnosis not present

## 2018-11-01 DIAGNOSIS — R600 Localized edema: Secondary | ICD-10-CM

## 2018-11-01 NOTE — Patient Instructions (Signed)
Medication Instructions:  Your Physician recommend you continue on your current medication as directed.    If you need a refill on your cardiac medications before your next appointment, please call your pharmacy.   Lab work: None  Testing/Procedures: None  Follow-Up: At [facility], you and your health needs are our priority.  As part of our continuing mission to provide you with exceptional heart care, we have created designated Provider Care Teams.  These Care Teams include your primary Cardiologist (physician) and Advanced Practice Providers (APPs -  Physician Assistants and Nurse Practitioners) who all work together to provide you with the care you need, when you need it. You will need a follow up appointment in 3 months.  Please call our office 2 months in advance to schedule this appointment.  You may see Dr. Christopher or one of the following Advanced Practice Providers on your designated Care Team:   Rhonda Barrett, PA-C . Kathryn Lawrence, DNP, ANP  Any Other Special Instructions Will Be Listed Below (If Applicable).    

## 2018-11-01 NOTE — Progress Notes (Signed)
Cardiology Office Note:    Date:  11/01/2018   ID:  Justin [name], DOB [birth year], MRN [id number]  PCP:  Dr. Donette Larry [facility], PA  Cardiologist:  Jodelle Red, MD PhD  Referring MD: [facility], PA  CC: establish care  History of Present Illness:    Justin [name] is a 57 y.o. male with a hx of chronic diastolic heart failure with recent acute heart failure hospitalization in 05/2018 who is seen as a new consult at the request of [facility] for the evaluation and management of chronic diastolic heart failure.  Patient was discharged on 06/18/18 after acute on chronic diastolic heart failure exacerbation. He presented with worsening LE edema to mid thigh and increasing shortness of breath. BNP 125. TSH 11. D dimer elevated, VQ scan low probability. Discharge weight was 147.1 kg (admission weight 148.8).   Since discharge, he has overall been feeling similar. Weights have been fluctuating. Up to 291 this AM, was 286 before. Given that his discharge weight was 324 lbs, this is a significant decrease from discharge. He is very good about fluid restrictions. Takes lasix twice daily and metolazone twice a week (Monday and Friday). Chronic LE edema, wears compression stockings at least 12 hours every day. Can't lie flat in bed, props up on several pillows raises the head of the bed as well as raises feet. No PND.  Had bloodwork this AM at his nursing facility, not available for my review today. Reports that his kidney function is stable per the facility. Breathing is good, no real shortness of breath. Going to the therapy room, using the parallell bars but otherwise in a wheelchair. Tries to use bicycle as well. No chest pain with exertion. Feels tired after this but otherwise feels well.     No history of carpal tunnel syndrome. Has CKD, follows with Dr. Layla Barter. Has chronic anemia as well. Has no clear history of neuropathy. UA negative for protein 05/2018.   Overall he is not  sure what led to volume overload in June 2019. He reports that his kidneys are "normal," though on review of his available records he has chronic kidney disease of at least stage 3.   Per notes from [facility], on 08/27/18 he had a 26 lb weight loss after admission, and his weight was stable at 291 lbs. TSH improved to 5 on synthroid supplementation. Renal function was stable.  Past Medical History:  Diagnosis Date  . Hypercholesteremia   . Hypertension   . Renal disorder     History reviewed. No pertinent surgical history.  Current Medications: Current Outpatient Medications on File Prior to Visit  Medication Sig  . acetaminophen (TYLENOL) 500 MG tablet Take 1,000 mg by mouth every 6 (six) hours as needed (for pain or headaches).  Marland Kitchen albuterol (PROVENTIL HFA;VENTOLIN HFA) 108 (90 BASE) MCG/ACT inhaler Inhale 2 puffs into the lungs every 4 (four) hours. (Patient taking differently: Inhale 2 puffs into the lungs every 4 (four) hours as needed for wheezing or shortness of breath. )  . aspirin EC 81 MG tablet Take 81 mg by mouth daily.  . Foot Care Products (GOLD BOND FOOT POWDER MAX ST) POWD Apply 1 application topically See admin instructions. Apply 1 application to affected areas of feet daily as directed  . furosemide (LASIX) 80 MG tablet Take 80 mg by mouth 2 (two) times daily.  . metolazone (ZAROXOLYN) 2.5 MG tablet Take 1 tablet (2.5 mg ) every Monday and Friday morning  .  Multiple Vitamins-Minerals (ONE-A-DAY MENS 50+ ADVANTAGE) TABS Take 1 tablet by mouth daily.   . potassium chloride SA (K-DUR,KLOR-CON) 20 MEQ tablet Take 20 mEq by mouth daily with breakfast.  . rosuvastatin (CRESTOR) 20 MG tablet Take 20 mg by mouth at bedtime.   Marland Kitchen tolnaftate (TINACTIN) 1 % cream Apply 1 application topically See admin instructions. Apply 1 application between toes daily as directed   No current facility-administered medications on file prior to visit.      Allergies:   Nsaids and Sulfa  antibiotics   Social History   Socioeconomic History  . Marital status: Single    Spouse name: Not on file  . Number of children: Not on file  . Years of education: Not on file  . Highest education level: Not on file  Occupational History  . Not on file  Social Needs  . Financial resource strain: Not on file  . Food insecurity:    Worry: Not on file    Inability: Not on file  . Transportation needs:    Medical: Not on file    Non-medical: Not on file  Tobacco Use  . Smoking status: Former Games developer  . Smokeless tobacco: Never Used  Substance and Sexual Activity  . Alcohol use: No  . Drug use: No  . Sexual activity: Not on file  Lifestyle  . Physical activity:    Days per week: Not on file    Minutes per session: Not on file  . Stress: Not on file  Relationships  . Social connections:    Talks on phone: Not on file    Gets together: Not on file    Attends religious service: Not on file    Active member of club or organization: Not on file    Attends meetings of clubs or organizations: Not on file    Relationship status: Not on file  Other Topics Concern  . Not on file  Social History Narrative  . Not on file   No children  Family History: The patient's family history is negative for Diabetes Mellitus II and CAD. No family history of heart failure.   ROS:   Please see the history of present illness.  Additional pertinent ROS:  Constitutional: Negative for chills, fever, night sweats, unintentional weight loss  HENT: Negative for ear pain and hearing loss.   Eyes: Negative for loss of vision and eye pain.  Respiratory: Positive for DOE. Negative for cough, sputum, shortness of breath, wheezing.   Cardiovascular: Positive for LE edema, orthopnea. Negative for chest pain, palpitations, PND, claudication.  Gastrointestinal: Negative for abdominal pain, melena, and hematochezia.  Genitourinary: Negative for dysuria and hematuria.  Musculoskeletal: Negative for falls  and myalgias.  Skin: Negative for itching and rash.  Neurological: Negative for focal weakness, focal sensory changes and loss of consciousness.  Endo/Heme/Allergies: Does not bruise/bleed easily.    EKGs/Labs/Other Studies Reviewed:    The following studies were reviewed today: Recent hospitalization, notes from [facility]  Echo 06/15/18 Study Conclusions  - Procedure narrative: Transthoracic echocardiography. Image   quality was quite suboptimal. The study was technically   difficult, as a result of body habitus. Intravenous contrast   (Definity) was administered. - Left ventricle: The cavity size was normal. Wall thickness was   increased in a pattern of mild LVH. Systolic function was normal.   The estimated ejection fraction was in the range of 60% to 65%.   The study is not technically sufficient to allow evaluation of  LV   diastolic function. - Pericardium, extracardiac: There was a left pleural effusion.  Impressions:  - Very technically difficult study. LVEF 60-65% by Definity   contrast images. There is a left pleural effusion.  EKG:  EKG is ordered today.  The ekg ordered today demonstrates sinus rhythm, 1st degree AV block, low voltage in precordial leads  Recent Labs: 06/14/2018: ALT 16; B Natriuretic Peptide 125.3; TSH 11.162 06/18/2018: BUN 35; Creatinine, Ser 2.00; Hemoglobin 11.1; Magnesium 2.3; Platelets 188; Potassium 4.0; Sodium 138  Recent Lipid Panel    Component Value Date/Time   CHOL 146 02/21/2010 2034   TRIG 44 02/21/2010 2034   HDL 51 02/21/2010 2034   CHOLHDL 2.9 Ratio 02/21/2010 2034   VLDL 9 02/21/2010 2034   LDLCALC 86 02/21/2010 2034    Physical Exam:    VS:  BP 119/75   Pulse 88   Ht 6' (1.829 m)   Wt 297 lb (134.7 kg)   BMI 40.28 kg/m     Wt Readings from Last 3 Encounters:  11/01/18 297 lb (134.7 kg)  06/18/18 (!) 324 lb 6.4 oz (147.1 kg)  06/08/13 281 lb 1.4 oz (127.5 kg)     GEN: Well nourished, well developed in no  acute distress HEENT: Normal NECK: JVD at clavicle at 90 degrees; No carotid bruits LYMPHATICS: No lymphadenopathy CARDIAC: regular rhythm, normal S1 and S2, no murmurs, rubs, gallops. Radial and DP pulses 2+ bilaterally. RESPIRATORY:  Clear to auscultation without rales, wheezing or rhonchi  ABDOMEN: Soft, non-tender, non-distended MUSCULOSKELETAL:  Bilateral firm 1-2+ edema under compression stockings; No deformity  SKIN: Warm and dry NEUROLOGIC:  Alert and oriented x 3 PSYCHIATRIC:  Normal affect   ASSESSMENT:    1. Chronic diastolic heart failure (HCC)   2. Encounter for education about heart failure   3. Bilateral leg edema   4. Class 3 severe obesity due to excess calories with serious comorbidity and body mass index (BMI) of 40.0 to 44.9 in adult (HCC)   5. Counseling on health promotion and disease prevention    PLAN:    1. Chronic diastolic heart failure, bilateral LE edema:  -on BID 80 mg lasix and metolazone Mon/Fri. Renal panel followed by PCP.  -JVD mildly elevated, lungs clear, bilateral LE edema with compression stockings  -given primarily right sided failure on exam today (though was also left sided during recent admission), low voltage, CKD, infiltrative disease such as amyloid is in the differential. However, he denies carpal tunnel, neuropathy, or other symptoms. TTE is low quality and difficult to interpret. Unclear about hypertrophy/diastolic dysfunction degree based on echo. Would continue diuresis, but given lack of history of hypertension, no family history of HOCM, if he does not improve would consider PYP scan for TTR amyloid. Would also do SPEP/UPEP for light chain amyloid  -consider spironolactone in the future, though renal function may limit this. If spironolactone added, discontinue potassium supplementation  Heart failure education: Do the following things EVERY DAY:  1) Weigh yourself EVERY morning after you go to the bathroom but before you eat or  drink anything. Write this number down in a weight log/diary. If you gain 3 pounds overnight or 5 pounds in a week, call the office.  2) Take your medicines as prescribed. If you have concerns about your medications, please call us before you stop taking them.   3) Eat low salt foods-Limit salt (sodium) to 2000 mg per day. This will help prevent your body from holding onto fluid.  Read food labels as many processed foods have a lot of sodium, especially canned goods and prepackaged meats. If you would like some assistance choosing low sodium foods, we would be happy to set you up with a nutritionist.  4) Stay as active as you can everyday. Staying active will give you more energy and make your muscles stronger. Start with 5 minutes at a time and work your way up to 30 minutes a day. Break up your activities--do some in the morning and some in the afternoon. Start with 3 days per week and work your way up to 5 days as you can.  If you have chest pain, feel short of breath, dizzy, or lightheaded, STOP. If you don't feel better after a short rest, call 911. If you do feel better, call the office to let us know you have symptoms with exercise.  5) Limit all fluids for the day to less than 2 liters. Fluid includes all drinks, coffee, juice, ice chips, soup, jello, and all other liquids.   2. Prevention -recommend heart healthy/Mediterranean diet, with whole grains, fruits, vegetable, fish, lean meats, nuts, and olive oil. Limit salt. -recommend moderate walking, 3-5 times/week for 30-50 minutes each session. Aim for at least 150 minutes.week. Goal should be pace of 3 miles/hours, or walking 1.5 miles in 30 minutes -recommend avoidance of tobacco products. Avoid alcohol. -Additional risk factor control:  -Diabetes: A1c is not available to me  -Lipids: not available to me since 2011  -Blood pressure control: runs on the low BP side on no specific meds for BP.   -Weight: BMI 40.3. Class 3 severe obesity  with complications. However, given his limitations currently, weight loss has been limited to predominantly fluid loss. He would benefit from weight loss in the future if his HFpEF allows. -ASCVD risk score: The ASCVD Risk score Denman George DC Jr., et al., 2013) failed to calculate for the following reasons:   Cannot find a previous HDL lab   Cannot find a previous total cholesterol lab   Plan for follow up: 3 mos  Medication Adjustments/Labs and Tests Ordered: Current medicines are reviewed at length with the patient today.  Concerns regarding medicines are outlined above.  Orders Placed This Encounter  Procedures  . EKG 12-Lead   No orders of the defined types were placed in this encounter.   Patient Instructions  Medication Instructions:  Your Physician recommend you continue on your current medication as directed.    If you need a refill on your cardiac medications before your next appointment, please call your pharmacy.   Lab work: None   Testing/Procedures: None  Follow-Up: At [facility], you and your health needs are our priority.  As part of our continuing mission to provide you with exceptional heart care, we have created designated Provider Care Teams.  These Care Teams include your primary Cardiologist (physician) and Advanced Practice Providers (APPs -  Physician Assistants and Nurse Practitioners) who all work together to provide you with the care you need, when you need it. You will need a follow up appointment in 3 months.  Please call our office 2 months in advance to schedule this appointment.  You may see Dr. Cristal Deer or one of the following Advanced Practice Providers on your designated Care Team:   Theodore Demark, PA-C . Joni Reining, DNP, ANP  Any Other Special Instructions Will Be Listed Below (If Applicable).        Signed, Jodelle Red, MD PhD 11/01/2018 8:48 PM  [facility]

## 2018-11-02 DIAGNOSIS — F338 Other recurrent depressive disorders: Secondary | ICD-10-CM | POA: Diagnosis not present

## 2018-11-04 DIAGNOSIS — L603 Nail dystrophy: Secondary | ICD-10-CM | POA: Diagnosis not present

## 2018-11-04 DIAGNOSIS — Q845 Enlarged and hypertrophic nails: Secondary | ICD-10-CM | POA: Diagnosis not present

## 2018-11-04 DIAGNOSIS — I739 Peripheral vascular disease, unspecified: Secondary | ICD-10-CM | POA: Diagnosis not present

## 2018-11-04 DIAGNOSIS — B351 Tinea unguium: Secondary | ICD-10-CM | POA: Diagnosis not present

## 2018-11-04 DIAGNOSIS — R600 Localized edema: Secondary | ICD-10-CM | POA: Diagnosis not present

## 2018-11-10 DIAGNOSIS — F338 Other recurrent depressive disorders: Secondary | ICD-10-CM | POA: Diagnosis not present

## 2018-11-12 DIAGNOSIS — H40003 Preglaucoma, unspecified, bilateral: Secondary | ICD-10-CM | POA: Diagnosis not present

## 2018-11-12 DIAGNOSIS — H524 Presbyopia: Secondary | ICD-10-CM | POA: Diagnosis not present

## 2018-11-12 DIAGNOSIS — H52223 Regular astigmatism, bilateral: Secondary | ICD-10-CM | POA: Diagnosis not present

## 2018-11-12 DIAGNOSIS — H5213 Myopia, bilateral: Secondary | ICD-10-CM | POA: Diagnosis not present

## 2018-11-16 DIAGNOSIS — F338 Other recurrent depressive disorders: Secondary | ICD-10-CM | POA: Diagnosis not present

## 2018-11-20 DIAGNOSIS — R319 Hematuria, unspecified: Secondary | ICD-10-CM | POA: Diagnosis not present

## 2018-11-20 DIAGNOSIS — N390 Urinary tract infection, site not specified: Secondary | ICD-10-CM | POA: Diagnosis not present

## 2018-11-21 ENCOUNTER — Inpatient Hospital Stay (HOSPITAL_COMMUNITY)
Admission: EM | Admit: 2018-11-21 | Discharge: 2018-11-28 | DRG: 871 | Disposition: E | Payer: Medicare HMO | Attending: Pulmonary Disease | Admitting: Pulmonary Disease

## 2018-11-21 ENCOUNTER — Emergency Department (HOSPITAL_COMMUNITY): Payer: Medicare HMO

## 2018-11-21 ENCOUNTER — Other Ambulatory Visit: Payer: Self-pay

## 2018-11-21 ENCOUNTER — Encounter (HOSPITAL_COMMUNITY): Payer: Self-pay | Admitting: Emergency Medicine

## 2018-11-21 DIAGNOSIS — I10 Essential (primary) hypertension: Secondary | ICD-10-CM | POA: Diagnosis not present

## 2018-11-21 DIAGNOSIS — A4102 Sepsis due to Methicillin resistant Staphylococcus aureus: Principal | ICD-10-CM | POA: Diagnosis present

## 2018-11-21 DIAGNOSIS — I311 Chronic constrictive pericarditis: Secondary | ICD-10-CM | POA: Diagnosis present

## 2018-11-21 DIAGNOSIS — I959 Hypotension, unspecified: Secondary | ICD-10-CM | POA: Diagnosis present

## 2018-11-21 DIAGNOSIS — T83091A Other mechanical complication of indwelling urethral catheter, initial encounter: Secondary | ICD-10-CM | POA: Diagnosis present

## 2018-11-21 DIAGNOSIS — J9811 Atelectasis: Secondary | ICD-10-CM | POA: Diagnosis present

## 2018-11-21 DIAGNOSIS — M7989 Other specified soft tissue disorders: Secondary | ICD-10-CM | POA: Diagnosis not present

## 2018-11-21 DIAGNOSIS — N179 Acute kidney failure, unspecified: Secondary | ICD-10-CM | POA: Diagnosis not present

## 2018-11-21 DIAGNOSIS — Z7982 Long term (current) use of aspirin: Secondary | ICD-10-CM

## 2018-11-21 DIAGNOSIS — Z466 Encounter for fitting and adjustment of urinary device: Secondary | ICD-10-CM | POA: Diagnosis not present

## 2018-11-21 DIAGNOSIS — K760 Fatty (change of) liver, not elsewhere classified: Secondary | ICD-10-CM | POA: Diagnosis present

## 2018-11-21 DIAGNOSIS — D631 Anemia in chronic kidney disease: Secondary | ICD-10-CM | POA: Diagnosis present

## 2018-11-21 DIAGNOSIS — R0600 Dyspnea, unspecified: Secondary | ICD-10-CM

## 2018-11-21 DIAGNOSIS — J15212 Pneumonia due to Methicillin resistant Staphylococcus aureus: Secondary | ICD-10-CM | POA: Diagnosis present

## 2018-11-21 DIAGNOSIS — Y738 Miscellaneous gastroenterology and urology devices associated with adverse incidents, not elsewhere classified: Secondary | ICD-10-CM | POA: Diagnosis present

## 2018-11-21 DIAGNOSIS — J90 Pleural effusion, not elsewhere classified: Secondary | ICD-10-CM | POA: Diagnosis present

## 2018-11-21 DIAGNOSIS — Z87891 Personal history of nicotine dependence: Secondary | ICD-10-CM

## 2018-11-21 DIAGNOSIS — R6521 Severe sepsis with septic shock: Secondary | ICD-10-CM | POA: Diagnosis present

## 2018-11-21 DIAGNOSIS — J869 Pyothorax without fistula: Secondary | ICD-10-CM

## 2018-11-21 DIAGNOSIS — J9602 Acute respiratory failure with hypercapnia: Secondary | ICD-10-CM | POA: Diagnosis present

## 2018-11-21 DIAGNOSIS — Z938 Other artificial opening status: Secondary | ICD-10-CM

## 2018-11-21 DIAGNOSIS — R310 Gross hematuria: Secondary | ICD-10-CM | POA: Diagnosis not present

## 2018-11-21 DIAGNOSIS — Z9889 Other specified postprocedural states: Secondary | ICD-10-CM

## 2018-11-21 DIAGNOSIS — E871 Hypo-osmolality and hyponatremia: Secondary | ICD-10-CM | POA: Diagnosis present

## 2018-11-21 DIAGNOSIS — N183 Chronic kidney disease, stage 3 unspecified: Secondary | ICD-10-CM | POA: Diagnosis present

## 2018-11-21 DIAGNOSIS — I4891 Unspecified atrial fibrillation: Secondary | ICD-10-CM | POA: Diagnosis not present

## 2018-11-21 DIAGNOSIS — A419 Sepsis, unspecified organism: Secondary | ICD-10-CM | POA: Diagnosis not present

## 2018-11-21 DIAGNOSIS — R0789 Other chest pain: Secondary | ICD-10-CM | POA: Diagnosis not present

## 2018-11-21 DIAGNOSIS — G4489 Other headache syndrome: Secondary | ICD-10-CM | POA: Diagnosis not present

## 2018-11-21 DIAGNOSIS — R079 Chest pain, unspecified: Secondary | ICD-10-CM

## 2018-11-21 DIAGNOSIS — R109 Unspecified abdominal pain: Secondary | ICD-10-CM | POA: Diagnosis not present

## 2018-11-21 DIAGNOSIS — Z79899 Other long term (current) drug therapy: Secondary | ICD-10-CM

## 2018-11-21 DIAGNOSIS — Z86718 Personal history of other venous thrombosis and embolism: Secondary | ICD-10-CM

## 2018-11-21 DIAGNOSIS — E872 Acidosis: Secondary | ICD-10-CM | POA: Diagnosis present

## 2018-11-21 DIAGNOSIS — Y95 Nosocomial condition: Secondary | ICD-10-CM | POA: Diagnosis present

## 2018-11-21 DIAGNOSIS — R6520 Severe sepsis without septic shock: Secondary | ICD-10-CM | POA: Diagnosis not present

## 2018-11-21 DIAGNOSIS — R609 Edema, unspecified: Secondary | ICD-10-CM | POA: Diagnosis not present

## 2018-11-21 DIAGNOSIS — Z886 Allergy status to analgesic agent status: Secondary | ICD-10-CM | POA: Diagnosis not present

## 2018-11-21 DIAGNOSIS — E162 Hypoglycemia, unspecified: Secondary | ICD-10-CM | POA: Diagnosis present

## 2018-11-21 DIAGNOSIS — K7290 Hepatic failure, unspecified without coma: Secondary | ICD-10-CM | POA: Diagnosis present

## 2018-11-21 DIAGNOSIS — I5031 Acute diastolic (congestive) heart failure: Secondary | ICD-10-CM | POA: Diagnosis not present

## 2018-11-21 DIAGNOSIS — E785 Hyperlipidemia, unspecified: Secondary | ICD-10-CM | POA: Diagnosis not present

## 2018-11-21 DIAGNOSIS — Z882 Allergy status to sulfonamides status: Secondary | ICD-10-CM | POA: Diagnosis not present

## 2018-11-21 DIAGNOSIS — N184 Chronic kidney disease, stage 4 (severe): Secondary | ICD-10-CM | POA: Diagnosis present

## 2018-11-21 DIAGNOSIS — I5033 Acute on chronic diastolic (congestive) heart failure: Secondary | ICD-10-CM | POA: Diagnosis present

## 2018-11-21 DIAGNOSIS — R091 Pleurisy: Secondary | ICD-10-CM | POA: Diagnosis not present

## 2018-11-21 DIAGNOSIS — Z66 Do not resuscitate: Secondary | ICD-10-CM | POA: Diagnosis present

## 2018-11-21 DIAGNOSIS — I5030 Unspecified diastolic (congestive) heart failure: Secondary | ICD-10-CM | POA: Diagnosis present

## 2018-11-21 DIAGNOSIS — R0602 Shortness of breath: Secondary | ICD-10-CM | POA: Diagnosis not present

## 2018-11-21 DIAGNOSIS — E7800 Pure hypercholesterolemia, unspecified: Secondary | ICD-10-CM | POA: Diagnosis present

## 2018-11-21 DIAGNOSIS — I313 Pericardial effusion (noninflammatory): Secondary | ICD-10-CM | POA: Diagnosis present

## 2018-11-21 DIAGNOSIS — K5730 Diverticulosis of large intestine without perforation or abscess without bleeding: Secondary | ICD-10-CM | POA: Diagnosis not present

## 2018-11-21 DIAGNOSIS — R188 Other ascites: Secondary | ICD-10-CM | POA: Diagnosis not present

## 2018-11-21 DIAGNOSIS — R7881 Bacteremia: Secondary | ICD-10-CM

## 2018-11-21 DIAGNOSIS — K7460 Unspecified cirrhosis of liver: Secondary | ICD-10-CM | POA: Diagnosis present

## 2018-11-21 DIAGNOSIS — R319 Hematuria, unspecified: Secondary | ICD-10-CM | POA: Diagnosis present

## 2018-11-21 DIAGNOSIS — R34 Anuria and oliguria: Secondary | ICD-10-CM | POA: Diagnosis present

## 2018-11-21 DIAGNOSIS — I509 Heart failure, unspecified: Secondary | ICD-10-CM | POA: Insufficient documentation

## 2018-11-21 DIAGNOSIS — N3289 Other specified disorders of bladder: Secondary | ICD-10-CM | POA: Diagnosis not present

## 2018-11-21 DIAGNOSIS — R931 Abnormal findings on diagnostic imaging of heart and coronary circulation: Secondary | ICD-10-CM | POA: Diagnosis not present

## 2018-11-21 DIAGNOSIS — Z4682 Encounter for fitting and adjustment of non-vascular catheter: Secondary | ICD-10-CM | POA: Diagnosis not present

## 2018-11-21 DIAGNOSIS — I130 Hypertensive heart and chronic kidney disease with heart failure and stage 1 through stage 4 chronic kidney disease, or unspecified chronic kidney disease: Secondary | ICD-10-CM | POA: Diagnosis present

## 2018-11-21 DIAGNOSIS — J9601 Acute respiratory failure with hypoxia: Secondary | ICD-10-CM | POA: Diagnosis not present

## 2018-11-21 DIAGNOSIS — Z978 Presence of other specified devices: Secondary | ICD-10-CM | POA: Diagnosis not present

## 2018-11-21 DIAGNOSIS — R7989 Other specified abnormal findings of blood chemistry: Secondary | ICD-10-CM | POA: Diagnosis not present

## 2018-11-21 DIAGNOSIS — G9340 Encephalopathy, unspecified: Secondary | ICD-10-CM | POA: Diagnosis not present

## 2018-11-21 HISTORY — DX: Heart failure, unspecified: I50.9

## 2018-11-21 HISTORY — DX: Acute embolism and thrombosis of unspecified deep veins of unspecified lower extremity: I82.409

## 2018-11-21 LAB — CBC WITH DIFFERENTIAL/PLATELET
ABS IMMATURE GRANULOCYTES: 0.29 10*3/uL — AB (ref 0.00–0.07)
BASOS ABS: 0 10*3/uL (ref 0.0–0.1)
BASOS PCT: 0 %
Eosinophils Absolute: 0 10*3/uL (ref 0.0–0.5)
Eosinophils Relative: 0 %
HCT: 39.9 % (ref 39.0–52.0)
HEMOGLOBIN: 11.7 g/dL — AB (ref 13.0–17.0)
Immature Granulocytes: 1 %
Lymphocytes Relative: 2 %
Lymphs Abs: 0.5 10*3/uL — ABNORMAL LOW (ref 0.7–4.0)
MCH: 19.3 pg — AB (ref 26.0–34.0)
MCHC: 29.3 g/dL — AB (ref 30.0–36.0)
MCV: 65.7 fL — ABNORMAL LOW (ref 80.0–100.0)
MONO ABS: 1.6 10*3/uL — AB (ref 0.1–1.0)
Monocytes Relative: 7 %
NEUTROS ABS: 21.4 10*3/uL — AB (ref 1.7–7.7)
NEUTROS PCT: 90 %
NRBC: 0 % (ref 0.0–0.2)
Platelets: 208 10*3/uL (ref 150–400)
RBC: 6.07 MIL/uL — AB (ref 4.22–5.81)
RDW: 20 % — ABNORMAL HIGH (ref 11.5–15.5)
WBC: 23.8 10*3/uL — AB (ref 4.0–10.5)

## 2018-11-21 LAB — I-STAT CG4 LACTIC ACID, ED: LACTIC ACID, VENOUS: 2.13 mmol/L — AB (ref 0.5–1.9)

## 2018-11-21 LAB — COMPREHENSIVE METABOLIC PANEL
ALK PHOS: 114 U/L (ref 38–126)
ALT: 19 U/L (ref 0–44)
AST: 34 U/L (ref 15–41)
Albumin: 1.7 g/dL — ABNORMAL LOW (ref 3.5–5.0)
Anion gap: 13 (ref 5–15)
BUN: 73 mg/dL — ABNORMAL HIGH (ref 6–20)
CHLORIDE: 91 mmol/L — AB (ref 98–111)
CO2: 28 mmol/L (ref 22–32)
CREATININE: 2.56 mg/dL — AB (ref 0.61–1.24)
Calcium: 7.9 mg/dL — ABNORMAL LOW (ref 8.9–10.3)
GFR, EST AFRICAN AMERICAN: 30 mL/min — AB (ref >60)
GFR, EST NON AFRICAN AMERICAN: 26 mL/min — AB (ref >60)
Glucose, Bld: 88 mg/dL (ref 70–99)
Potassium: 4.6 mmol/L (ref 3.5–5.1)
Sodium: 132 mmol/L — ABNORMAL LOW (ref 135–145)
TOTAL PROTEIN: 5.6 g/dL — AB (ref 6.5–8.1)
Total Bilirubin: 0.9 mg/dL (ref 0.3–1.2)

## 2018-11-21 LAB — URINALYSIS, ROUTINE W REFLEX MICROSCOPIC
BILIRUBIN URINE: NEGATIVE
GLUCOSE, UA: NEGATIVE mg/dL
HGB URINE DIPSTICK: NEGATIVE
Ketones, ur: NEGATIVE mg/dL
Leukocytes, UA: NEGATIVE
Nitrite: NEGATIVE
PH: 5 (ref 5.0–8.0)
PROTEIN: NEGATIVE mg/dL
Specific Gravity, Urine: 1.015 (ref 1.005–1.030)

## 2018-11-21 LAB — I-STAT CHEM 8, ED
BUN: 69 mg/dL — ABNORMAL HIGH (ref 6–20)
CALCIUM ION: 0.92 mmol/L — AB (ref 1.15–1.40)
CREATININE: 2.8 mg/dL — AB (ref 0.61–1.24)
Chloride: 93 mmol/L — ABNORMAL LOW (ref 98–111)
GLUCOSE: 83 mg/dL (ref 70–99)
HCT: 41 % (ref 39.0–52.0)
HEMOGLOBIN: 13.9 g/dL (ref 13.0–17.0)
POTASSIUM: 4.6 mmol/L (ref 3.5–5.1)
Sodium: 130 mmol/L — ABNORMAL LOW (ref 135–145)
TCO2: 35 mmol/L — AB (ref 22–32)

## 2018-11-21 LAB — TROPONIN I

## 2018-11-21 LAB — BRAIN NATRIURETIC PEPTIDE: B NATRIURETIC PEPTIDE 5: 236.1 pg/mL — AB (ref 0.0–100.0)

## 2018-11-21 LAB — D-DIMER, QUANTITATIVE: D-Dimer, Quant: 2.78 ug/mL-FEU — ABNORMAL HIGH (ref 0.00–0.50)

## 2018-11-21 MED ORDER — ROSUVASTATIN CALCIUM 20 MG PO TABS
20.0000 mg | ORAL_TABLET | Freq: Every day | ORAL | Status: DC
  Administered 2018-11-21: 20 mg via ORAL
  Filled 2018-11-21 (×2): qty 1

## 2018-11-21 MED ORDER — HYDROCODONE-ACETAMINOPHEN 5-325 MG PO TABS
1.0000 | ORAL_TABLET | Freq: Four times a day (QID) | ORAL | Status: DC | PRN
  Administered 2018-11-21 – 2018-11-22 (×2): 2 via ORAL
  Filled 2018-11-21 (×2): qty 2

## 2018-11-21 MED ORDER — FUROSEMIDE 10 MG/ML IJ SOLN
80.0000 mg | Freq: Two times a day (BID) | INTRAMUSCULAR | Status: DC
  Administered 2018-11-21 – 2018-11-22 (×2): 80 mg via INTRAVENOUS
  Filled 2018-11-21 (×2): qty 8

## 2018-11-21 MED ORDER — HEPARIN SODIUM (PORCINE) 5000 UNIT/ML IJ SOLN: 5000.0000 [IU] | Freq: Three times a day (TID) | INTRAMUSCULAR | Status: DC

## 2018-11-21 MED ORDER — FUROSEMIDE 10 MG/ML IJ SOLN: 60.0000 mg | Freq: Two times a day (BID) | INTRAMUSCULAR | Status: DC

## 2018-11-21 MED ORDER — SODIUM CHLORIDE 0.9 % IV SOLN
250.0000 mL | INTRAVENOUS | Status: DC | PRN
  Administered 2018-11-22: 250 mL via INTRAVENOUS

## 2018-11-21 MED ORDER — ACETAMINOPHEN 325 MG PO TABS: 650.0000 mg | ORAL_TABLET | ORAL | Status: DC | PRN

## 2018-11-21 MED ORDER — HEPARIN BOLUS VIA INFUSION
5000.0000 [IU] | Freq: Once | INTRAVENOUS | Status: AC
  Administered 2018-11-21: 5000 [IU] via INTRAVENOUS
  Filled 2018-11-21: qty 5000

## 2018-11-21 MED ORDER — ONDANSETRON HCL 4 MG/2ML IJ SOLN: 4.0000 mg | Freq: Four times a day (QID) | INTRAMUSCULAR | Status: DC | PRN

## 2018-11-21 MED ORDER — ALBUTEROL SULFATE (2.5 MG/3ML) 0.083% IN NEBU
2.5000 mg | INHALATION_SOLUTION | RESPIRATORY_TRACT | Status: DC
  Administered 2018-11-21 – 2018-11-22 (×3): 2.5 mg via RESPIRATORY_TRACT
  Filled 2018-11-21 (×3): qty 3

## 2018-11-21 MED ORDER — METOPROLOL TARTRATE 12.5 MG HALF TABLET
12.5000 mg | ORAL_TABLET | Freq: Two times a day (BID) | ORAL | Status: DC
  Administered 2018-11-21: 12.5 mg via ORAL
  Filled 2018-11-21 (×2): qty 1

## 2018-11-21 MED ORDER — SODIUM CHLORIDE 0.9% FLUSH: 3.0000 mL | INTRAVENOUS | Status: DC | PRN

## 2018-11-21 MED ORDER — FENTANYL CITRATE (PF) 100 MCG/2ML IJ SOLN
50.0000 ug | Freq: Once | INTRAMUSCULAR | Status: AC
  Administered 2018-11-21: 50 ug via INTRAVENOUS
  Filled 2018-11-21: qty 2

## 2018-11-21 MED ORDER — HEPARIN (PORCINE) 25000 UT/250ML-% IV SOLN
1600.0000 [IU]/h | INTRAVENOUS | Status: DC
  Administered 2018-11-21: 1500 [IU]/h via INTRAVENOUS
  Administered 2018-11-22 (×2): 1600 [IU]/h via INTRAVENOUS
  Filled 2018-11-21 (×3): qty 250

## 2018-11-21 MED ORDER — ACETAMINOPHEN 500 MG PO TABS: 1000.0000 mg | ORAL_TABLET | Freq: Four times a day (QID) | ORAL | Status: DC | PRN

## 2018-11-21 MED ORDER — ASPIRIN EC 81 MG PO TBEC
81.0000 mg | DELAYED_RELEASE_TABLET | Freq: Every day | ORAL | Status: DC
  Administered 2018-11-22: 81 mg via ORAL
  Filled 2018-11-21 (×2): qty 1

## 2018-11-21 MED ORDER — SODIUM CHLORIDE 0.9% FLUSH
3.0000 mL | Freq: Two times a day (BID) | INTRAVENOUS | Status: DC
  Administered 2018-11-21 – 2018-11-22 (×2): 3 mL via INTRAVENOUS

## 2018-11-21 NOTE — ED Triage Notes (Signed)
Per [facility] pt coming from [facility] c/o chest pain, right flank pain and generalized weakness onset yesterday. Gained 2 pounds since yesterday, hx CHF. 86% on RA placed on 4L [place] PTA. Given 324 aspirin and 1 nitro. Pain from 8 to 2.

## 2018-11-21 NOTE — Significant Event (Signed)
Rapid Response Event Note  Overview: Respiratory  Initial Focused Assessment: Follow Up: I went to assess the as a follow up. Patient was alert and oriented, conversant, not in acute distress. Patient's only complaint was RLE (calf) pain. BLE were very swollen, per patient he has swelling in his legs, patient just received some lasix. No change in temperature in RLE. Patient's denies shortness of breath and stated that his breathing was better since has been admitted. RT lung sounds - very diminished, LT lung sounds - poor air movement (large LEFT pleural effusion - awaiting thoracentesis). Saturations maintaining > 95% on 3L . + pulses, HR in the 90s (at times in the 130s, CARDS on board).  Interventions: - None   Plan of Care: - RN to request pain medication for RLE pain  - Monitor respiratory status closely - Call RRT if needed.  Event Summary:    [facility]     [facility] End Time 2230  Aryssa Rosamond R

## 2018-11-21 NOTE — ED Provider Notes (Signed)
[facility] EMERGENCY DEPARTMENT Provider Note   CSN: [id number] Arrival date & time: 11/16/2018  0272     History   Chief Complaint Chief Complaint  Patient presents with  . Chest Pain  . Flank Pain    HPI Justin [name] is a 57 y.o. male.  HPI Patient with history of CHF and chronic kidney disease presents with lower extremity swelling, right calf pain, right flank pain, chest pain, shortness of breath and weight gain.  Symptoms acutely worsened yesterday.  Noted to be toxic to 86% on room air by EMS.  Placed on supplemental oxygen.  Given aspirin and nitroglycerin with improvement of his chest pain. Past Medical History:  Diagnosis Date  . Congestive heart disease (HCC)   . DVT (deep venous thrombosis) (HCC)   . Hypercholesteremia   . Hypertension   . Renal disorder     Patient Active Problem List   Diagnosis Date Noted  . Hypochromic microcytic anemia/Chronic 06/16/2018  . Hypothyroidism 06/16/2018  . (HFpEF) heart failure with preserved ejection fraction- Acute CHF Exacerbation 06/16/2018  . Acute CHF (congestive heart failure) (HCC) 06/14/2018  . CKD (chronic kidney disease) stage 3, GFR 30-59 ml/min (HCC) 06/14/2018    History reviewed. No pertinent surgical history.      Home Medications    Prior to Admission medications   Medication Sig Start Date End Date Taking? Authorizing Provider  acetaminophen (TYLENOL) 500 MG tablet Take 1,000 mg by mouth every 6 (six) hours as needed (for pain or headaches).    [provider]  albuterol (PROVENTIL HFA;VENTOLIN HFA) 108 (90 BASE) MCG/ACT inhaler Inhale 2 puffs into the lungs every 4 (four) hours. Patient taking differently: Inhale 2 puffs into the lungs every 4 (four) hours as needed for wheezing or shortness of breath.  11/08/13   Elenora Gamma, MD  aspirin EC 81 MG tablet Take 81 mg by mouth daily.    [provider]  Foot Care Products (GOLD BOND FOOT POWDER MAX ST)  POWD Apply 1 application topically See admin instructions. Apply 1 application to affected areas of feet daily as directed    [provider]  furosemide (LASIX) 80 MG tablet Take 80 mg by mouth 2 (two) times daily.    [provider]  metolazone (ZAROXOLYN) 2.5 MG tablet Take 1 tablet (2.5 mg ) every Monday and Friday morning 06/18/18   Shon Hale, MD  Multiple Vitamins-Minerals (ONE-A-DAY MENS 50+ ADVANTAGE) TABS Take 1 tablet by mouth daily.     [provider]  potassium chloride SA (K-DUR,KLOR-CON) 20 MEQ tablet Take 20 mEq by mouth daily with breakfast.    [provider]  rosuvastatin (CRESTOR) 20 MG tablet Take 20 mg by mouth at bedtime.     [provider]  tolnaftate (TINACTIN) 1 % cream Apply 1 application topically See admin instructions. Apply 1 application between toes daily as directed    [provider]    Family History Family History  Problem Relation Age of Onset  . Diabetes Mellitus II Neg Hx   . CAD Neg Hx     Social History Social History   Tobacco Use  . Smoking status: Former Games developer  . Smokeless tobacco: Never Used  Substance Use Topics  . Alcohol use: No  . Drug use: No     Allergies   Nsaids and Sulfa antibiotics   Review of Systems Review of Systems  Constitutional: Negative for chills and fever.  HENT: Negative for  sore throat and trouble swallowing.   Eyes: Negative for visual disturbance.  Respiratory: Positive for cough and shortness of breath. Negative for wheezing.   Cardiovascular: Positive for chest pain and leg swelling. Negative for palpitations.  Gastrointestinal: Positive for abdominal distention. Negative for abdominal pain, constipation, diarrhea and vomiting.  Genitourinary: Positive for flank pain and hematuria. Negative for dysuria, enuresis and frequency.  Musculoskeletal: Positive for back pain and myalgias.  Skin: Negative for rash and wound.  Neurological: Negative for  dizziness, weakness, light-headedness, numbness and headaches.  All other systems reviewed and are negative.    Physical Exam Updated Vital Signs BP 113/83   Pulse (!) 130   Temp 98.1 F (36.7 C) (Oral)   Resp 20   Ht 6' (1.829 m)   Wt (!) 138.6 kg   SpO2 98%   BMI 41.43 kg/m   Physical Exam  Constitutional: He is oriented to person, place, and time. He appears well-developed and well-nourished.  HENT:  Head: Normocephalic and atraumatic.  Mouth/Throat: Oropharynx is clear and moist. No oropharyngeal exudate.  Eyes: Pupils are equal, round, and reactive to light. EOM are normal.  Neck: Normal range of motion. Neck supple. No JVD present.  Cardiovascular: Regular rhythm. Exam reveals no gallop and no friction rub.  No murmur heard. Tachycardia  Pulmonary/Chest: No respiratory distress. He has no wheezes. He has no rales.  Increased respiratory effort.  Mildly diminished in bilateral bases.  Abdominal: Soft. Bowel sounds are normal. He exhibits distension. He exhibits no mass. There is no tenderness. There is no rebound and no guarding.  Anasarca present  Musculoskeletal: Normal range of motion. He exhibits edema. He exhibits no tenderness.  3+ bilateral lower extremity pitting edema.  Patient does have some posterior right chest pain yet compared to left.  Lymphadenopathy:    He has no cervical adenopathy.  Neurological: He is alert and oriented to person, place, and time.  Moving all extremities without focal deficit.  Sensation intact.  Skin: Skin is warm and dry. Capillary refill takes less than 2 seconds. No rash noted. He is not diaphoretic. No erythema.  Psychiatric: He has a normal mood and affect. His behavior is normal.  Nursing note and vitals reviewed.    ED Treatments / Results  Labs (all labs ordered are listed, but only abnormal results are displayed) Labs Reviewed  CBC WITH DIFFERENTIAL/PLATELET - Abnormal; Notable for the following components:       Result Value   WBC 23.8 (*)    RBC 6.07 (*)    Hemoglobin 11.7 (*)    MCV 65.7 (*)    MCH 19.3 (*)    MCHC 29.3 (*)    RDW 20.0 (*)    Neutro Abs 21.4 (*)    Lymphs Abs 0.5 (*)    Monocytes Absolute 1.6 (*)    Abs Immature Granulocytes 0.29 (*)    All other components within normal limits  BRAIN NATRIURETIC PEPTIDE - Abnormal; Notable for the following components:   B Natriuretic Peptide 236.1 (*)    All other components within normal limits  COMPREHENSIVE METABOLIC PANEL - Abnormal; Notable for the following components:   Sodium 132 (*)    Chloride 91 (*)    BUN 73 (*)    Creatinine, Ser 2.56 (*)    Calcium 7.9 (*)    Total Protein 5.6 (*)    Albumin 1.7 (*)    GFR calc non Af Amer 26 (*)    GFR calc Af Denyse Dago  30 (*)    All other components within normal limits  D-DIMER, QUANTITATIVE (NOT AT [facility]) - Abnormal; Notable for the following components:   D-Dimer, Quant 2.78 (*)    All other components within normal limits  I-STAT CG4 LACTIC ACID, ED - Abnormal; Notable for the following components:   Lactic Acid, Venous 2.13 (*)    All other components within normal limits  I-STAT CHEM 8, ED - Abnormal; Notable for the following components:   Sodium 130 (*)    Chloride 93 (*)    BUN 69 (*)    Creatinine, Ser 2.80 (*)    Calcium, Ion 0.92 (*)    TCO2 35 (*)    All other components within normal limits  TROPONIN I  URINALYSIS, ROUTINE W REFLEX MICROSCOPIC    EKG EKG Interpretation  Date/Time:  Sunday November 21 2018 09:56:12 EST Ventricular Rate:  127 PR Interval:    QRS Duration: 64 QT Interval:  423 QTC Calculation: 615 R Axis:   73 Text Interpretation:  Low voltage, precordial leads Prolonged QT interval Confirmed by Loren Racer ([id number]) on 11/25/2018 10:29:39 AM   Radiology Ct Abdomen Pelvis Wo Contrast  Result Date: 11/20/2018 CLINICAL DATA:  58 year old male with history of chest pain, right-sided flank pain and generalized weakness since yesterday.  EXAM: CT CHEST, ABDOMEN AND PELVIS WITHOUT CONTRAST TECHNIQUE: Multidetector CT imaging of the chest, abdomen and pelvis was performed following the standard protocol without IV contrast. COMPARISON:  CT the chest 04/05/2008. CT the abdomen and pelvis 04/03/2008. FINDINGS: CT CHEST FINDINGS Cardiovascular: Heart size is normal. Pericardial thickening and trace amount of pericardial fluid with pericardial calcification. Narrowing of the cardiac contours, suggesting potential constrictive physiology. There is aortic atherosclerosis, as well as atherosclerosis of the great vessels of the mediastinum and the coronary arteries, including calcified atherosclerotic plaque in the left anterior descending and right coronary arteries. Mediastinum/Nodes: No pathologically enlarged mediastinal or hilar lymph nodes. Please note that accurate exclusion of hilar adenopathy is limited on noncontrast CT scans. Esophagus is unremarkable in appearance. No axillary lymphadenopathy. Lungs/Pleura: Large left pleural effusion with near complete passive atelectasis throughout the left lung. Minimal residual aerated left upper lobe and superior segment of the left lower lobe. Right lung is well aerated. No right-sided pleural effusion. No definite suspicious appearing pulmonary nodules or masses are noted. Musculoskeletal: There are no aggressive appearing lytic or blastic lesions noted in the visualized portions of the skeleton. CT ABDOMEN PELVIS FINDINGS Hepatobiliary: No definite suspicious cystic or solid hepatic lesions are confidently identified on today's noncontrast CT examination. Mild diffuse low attenuation throughout the hepatic parenchyma, indicative of hepatic steatosis. Liver also has a shrunken appearance and nodular contour, indicative of underlying cirrhosis. Gallbladder is poorly demonstrated. Pancreas: No definite pancreatic mass or peripancreatic fluid or inflammatory changes are noted on today's noncontrast CT  examination. Spleen: Unremarkable. Adrenals/Urinary Tract: There are no abnormal calcifications within the collecting system of either kidney, along the course of either ureter, or within the lumen of the urinary bladder. No hydroureteronephrosis or perinephric stranding to suggest urinary tract obstruction at this time. The unenhanced appearance of the kidneys is unremarkable bilaterally. Unenhanced appearance of the urinary bladder is normal. Bilateral adrenal glands are normal in appearance. Stomach/Bowel: Normal appearance of the stomach. No pathologic dilatation of small bowel or colon. A few scattered colonic diverticulae are noted, without definite surrounding inflammatory changes to strongly suggest an acute diverticulitis at this time (assessment is limited by the presence of a small  volume of ascites). Normal appendix. Vascular/Lymphatic: No atherosclerotic calcifications noted in the abdominal or pelvic vasculature. Multiple prominent borderline enlarged and mildly enlarged pelvic lymph nodes measuring up 2 1.3 cm in short axis in the right external iliac nodal station, nonspecific and favored to be reactive. Reproductive: Prostate gland and seminal vesicles are unremarkable in appearance. Other: Small volume of ascites. No pneumoperitoneum. Diffuse body wall edema. Musculoskeletal: There are no aggressive appearing lytic or blastic lesions noted in the visualized portions of the skeleton. IMPRESSION: 1. Large left pleural effusion with near complete passive atelectasis throughout the left lung. 2. Chronic pericardial thickening and calcification with morphologic changes in the heart suggestive of constrictive physiology. Correlation with echocardiography is strongly recommended if not already performed. 3. Morphologic changes in the liver compatible with cirrhosis. There is also evidence of hepatic steatosis. 4. Small volume of ascites. 5. Diffuse body wall edema. 6. Mild colonic diverticulosis. No  definite findings to strongly suggest an acute diverticulitis at this time (assessment is slightly limited by the presence of ascites). 7. Additional incidental findings, as above. Electronically Signed   By: Trudie Reedaniel  Entrikin M.D.   On: 07-22-18 15:06   Ct Chest Wo Contrast  Result Date: 07-22-18 CLINICAL DATA:  57 year old male with history of chest pain, right-sided flank pain and generalized weakness since yesterday. EXAM: CT CHEST, ABDOMEN AND PELVIS WITHOUT CONTRAST TECHNIQUE: Multidetector CT imaging of the chest, abdomen and pelvis was performed following the standard protocol without IV contrast. COMPARISON:  CT the chest 04/05/2008. CT the abdomen and pelvis 04/03/2008. FINDINGS: CT CHEST FINDINGS Cardiovascular: Heart size is normal. Pericardial thickening and trace amount of pericardial fluid with pericardial calcification. Narrowing of the cardiac contours, suggesting potential constrictive physiology. There is aortic atherosclerosis, as well as atherosclerosis of the great vessels of the mediastinum and the coronary arteries, including calcified atherosclerotic plaque in the left anterior descending and right coronary arteries. Mediastinum/Nodes: No pathologically enlarged mediastinal or hilar lymph nodes. Please note that accurate exclusion of hilar adenopathy is limited on noncontrast CT scans. Esophagus is unremarkable in appearance. No axillary lymphadenopathy. Lungs/Pleura: Large left pleural effusion with near complete passive atelectasis throughout the left lung. Minimal residual aerated left upper lobe and superior segment of the left lower lobe. Right lung is well aerated. No right-sided pleural effusion. No definite suspicious appearing pulmonary nodules or masses are noted. Musculoskeletal: There are no aggressive appearing lytic or blastic lesions noted in the visualized portions of the skeleton. CT ABDOMEN PELVIS FINDINGS Hepatobiliary: No definite suspicious cystic or solid  hepatic lesions are confidently identified on today's noncontrast CT examination. Mild diffuse low attenuation throughout the hepatic parenchyma, indicative of hepatic steatosis. Liver also has a shrunken appearance and nodular contour, indicative of underlying cirrhosis. Gallbladder is poorly demonstrated. Pancreas: No definite pancreatic mass or peripancreatic fluid or inflammatory changes are noted on today's noncontrast CT examination. Spleen: Unremarkable. Adrenals/Urinary Tract: There are no abnormal calcifications within the collecting system of either kidney, along the course of either ureter, or within the lumen of the urinary bladder. No hydroureteronephrosis or perinephric stranding to suggest urinary tract obstruction at this time. The unenhanced appearance of the kidneys is unremarkable bilaterally. Unenhanced appearance of the urinary bladder is normal. Bilateral adrenal glands are normal in appearance. Stomach/Bowel: Normal appearance of the stomach. No pathologic dilatation of small bowel or colon. A few scattered colonic diverticulae are noted, without definite surrounding inflammatory changes to strongly suggest an acute diverticulitis at this time (assessment is limited by the presence  of a small volume of ascites). Normal appendix. Vascular/Lymphatic: No atherosclerotic calcifications noted in the abdominal or pelvic vasculature. Multiple prominent borderline enlarged and mildly enlarged pelvic lymph nodes measuring up 2 1.3 cm in short axis in the right external iliac nodal station, nonspecific and favored to be reactive. Reproductive: Prostate gland and seminal vesicles are unremarkable in appearance. Other: Small volume of ascites. No pneumoperitoneum. Diffuse body wall edema. Musculoskeletal: There are no aggressive appearing lytic or blastic lesions noted in the visualized portions of the skeleton. IMPRESSION: 1. Large left pleural effusion with near complete passive atelectasis throughout  the left lung. 2. Chronic pericardial thickening and calcification with morphologic changes in the heart suggestive of constrictive physiology. Correlation with echocardiography is strongly recommended if not already performed. 3. Morphologic changes in the liver compatible with cirrhosis. There is also evidence of hepatic steatosis. 4. Small volume of ascites. 5. Diffuse body wall edema. 6. Mild colonic diverticulosis. No definite findings to strongly suggest an acute diverticulitis at this time (assessment is slightly limited by the presence of ascites). 7. Additional incidental findings, as above. Electronically Signed   By: Trudie Reed M.D.   On: 14-Dec-2018 15:06   Dg Chest Port 1 View  Result Date: 2018/12/14 CLINICAL DATA:  Chest pain, RIGHT flank pain and generalized weakness since yesterday EXAM: PORTABLE CHEST 1 VIEW COMPARISON:  Portable exam 1024 hours compared to 06/17/2018 FINDINGS: Enlargement of cardiac silhouette. Pulmonary vascular congestion. Large LEFT pleural effusion with significant opacification of the inferior 2/3 of the LEFT lung. Mild central peribronchial thickening and RIGHT basilar atelectasis. No pneumothorax or acute osseous findings. IMPRESSION: Large LEFT pleural effusion with significant LEFT lung atelectasis; underlying mass and infiltrate not excluded in this setting. Bronchitic changes with minimal RIGHT basilar atelectasis. Enlargement of cardiac silhouette. Electronically Signed   By: Ulyses Southward M.D.   On: 2018-12-14 10:41    Procedures Procedures (including critical care time)  Medications Ordered in ED Medications  fentaNYL (SUBLIMAZE) injection 50 mcg (50 mcg Intravenous Given Dec 14, 2018 1409)    CRITICAL CARE Performed by: Loren Racer Total critical care time: 30 minutes Critical care time was exclusive of separately billable procedures and treating other patients. Critical care was necessary to treat or prevent imminent or life-threatening  deterioration. Critical care was time spent personally by me on the following activities: development of treatment plan with patient and/or surrogate as well as nursing, discussions with consultants, evaluation of patient's response to treatment, examination of patient, obtaining history from patient or surrogate, ordering and performing treatments and interventions, ordering and review of laboratory studies, ordering and review of radiographic studies, pulse oximetry and re-evaluation of patient's condition. Initial Impression / Assessment and Plan / ED Course  I have reviewed the triage vital signs and the nursing notes.  Pertinent labs & imaging results that were available during my care of the patient were reviewed by me and considered in my medical decision making (see chart for details).     Requiring supplemental oxygen.  Near complete collapse of the left lung with pleural effusion.  Patient with mild elevation in lactic acid.  He does have an elevated white blood cell count.  No evidence of infective process.  Requiring supplemental oxygen to maintain saturations.  Will need thoracentesis and diuresis.  Discussed with hospitalist will see patient and admit.  Final Clinical Impressions(s) / ED Diagnoses   Final diagnoses:  Pleural effusion on left  Chest pain, unspecified type    ED Discharge Orders  None       Loren Racer, MD 2018-12-01 1529

## 2018-11-21 NOTE — Consult Note (Signed)
Cardiology Consultation:   Patient ID: Justin [name] MRN: [id number]; DOB: 1961-07-28  Admit date: 11/19/2018 Date of Consult: 11/25/2018  Primary Care Provider: Juluis Rainier, MD Primary Cardiologist:Dr. Cristal Deer Primary Electrophysiologist:  None    Patient Profile:   Justin [name] is a 57 y.o. male with a hx of chronic diastolic heart failure who is being seen today for the evaluation of acute on chronic diastolic heart failure at the request of Dr. Elvera Lennox.  History of Present Illness:   Mr. Justin [name] is a very pleasant 57 year old man who resides at a skilled nursing facility who was admitted to the hospital for anasarca.  The patient has been found to have total white out of his left lung on chest x-ray and a very large pleural effusion as well as a pericardial effusion with evidence of chronic pericarditis on CT scan.  He was hospitalized several weeks ago with fluid overload.  Over the last 3 days, he has had pain in his right calf as well as increasing shortness of breath, dyspnea, PND, and orthopnea.  He denies dietary or medical noncompliance.  He denies fevers or chills.  No coughing.  He may have a urinary infection by his report.  Past Medical History:  Diagnosis Date  . Congestive heart disease (HCC)   . DVT (deep venous thrombosis) (HCC)   . Hypercholesteremia   . Hypertension   . Renal disorder     History reviewed. No pertinent surgical history.   Home Medications:  Prior to Admission medications   Medication Sig Start Date End Date Taking? Authorizing Provider  acetaminophen (TYLENOL) 500 MG tablet Take 1,000 mg by mouth every 6 (six) hours as needed (for pain or headaches).   Yes [provider]  albuterol (PROVENTIL HFA;VENTOLIN HFA) 108 (90 BASE) MCG/ACT inhaler Inhale 2 puffs into the lungs every 4 (four) hours. Patient taking differently: Inhale 2 puffs into the lungs every 4 (four) hours as needed for wheezing or shortness of breath.   11/08/13  Yes Elenora Gamma, MD  ammonium lactate (LAC-HYDRIN) 12 % lotion Apply 1 application topically 2 (two) times daily. Apply to bilateral legs for dry skin   Yes [provider]  aspirin EC 81 MG tablet Take 81 mg by mouth daily.   Yes [provider]  Cholecalciferol 25 MCG (1000 UT) capsule Take 2,000 Units by mouth daily.   Yes [provider]  furosemide (LASIX) 40 MG tablet Take 40 mg by mouth as needed (for fluid for a 5lb weight gain in 48 hours).   Yes [provider]  furosemide (LASIX) 80 MG tablet Take 80 mg by mouth 2 (two) times daily.   Yes [provider]  metolazone (ZAROXOLYN) 2.5 MG tablet Take 1 tablet (2.5 mg ) every Monday and Friday morning 06/18/18  Yes Emokpae, Courage, MD  Multiple Vitamins-Minerals (ONE-A-DAY MENS 50+ ADVANTAGE) TABS Take 1 tablet by mouth daily.    Yes [provider]  potassium chloride SA (K-DUR,KLOR-CON) 20 MEQ tablet Take 20 mEq by mouth daily with breakfast.   Yes [provider]  rosuvastatin (CRESTOR) 20 MG tablet Take 20 mg by mouth at bedtime.    Yes [provider]    Inpatient Medications: Scheduled Meds: . heparin  5,000 Units Intravenous Once   Continuous Infusions: . heparin     PRN Meds:   Allergies:    Allergies  Allergen Reactions  . Nsaids Other (See Comments)    Affect kidneys negatively  .  Sulfa Antibiotics Diarrhea    Social History:   Social History   Socioeconomic History  . Marital status: Single    Spouse name: Not on file  . Number of children: Not on file  . Years of education: Not on file  . Highest education level: Not on file  Occupational History  . Not on file  Social Needs  . Financial resource strain: Not on file  . Food insecurity:    Worry: Not on file    Inability: Not on file  . Transportation needs:    Medical: Not on file    Non-medical: Not on file  Tobacco Use  . Smoking status: Former Games developer  .  Smokeless tobacco: Never Used  Substance and Sexual Activity  . Alcohol use: No  . Drug use: No  . Sexual activity: Not on file  Lifestyle  . Physical activity:    Days per week: Not on file    Minutes per session: Not on file  . Stress: Not on file  Relationships  . Social connections:    Talks on phone: Not on file    Gets together: Not on file    Attends religious service: Not on file    Active member of club or organization: Not on file    Attends meetings of clubs or organizations: Not on file    Relationship status: Not on file  . Intimate partner violence:    Fear of current or ex partner: Not on file    Emotionally abused: Not on file    Physically abused: Not on file    Forced sexual activity: Not on file  Other Topics Concern  . Not on file  Social History Narrative  . Not on file    Family History:    Family History  Problem Relation Age of Onset  . Diabetes Mellitus II Neg Hx   . CAD Neg Hx      ROS:  Please see the history of present illness.   All other ROS reviewed and negative.     Physical Exam/Data:   Vitals:   11/02/2018 1300 10/29/2018 1330 11/12/2018 1400 11/24/2018 1430  BP: 102/64 115/78 (!) 104/94 113/83  Pulse: (!) 129 (!) 129 (!) 130 (!) 130  Resp: 20 18 20 20   Temp:      TempSrc:      SpO2: 98% 98% 97% 98%  Weight:      Height:       No intake or output data in the 24 hours ending 10/29/2018 1649 Filed Weights   11/15/2018 1011  Weight: (!) 138.6 kg   Body mass index is 41.43 kg/m.  General: Chronically ill-appearing, disc kempt, middle-aged man  HEENT: normal except for very poor dentition Lymph: no adenopathy Neck: 10 cm JVD Endocrine:  No thryomegaly Vascular: No carotid bruits; FA pulses 2+ bilaterally without bruits  Cardiac:  normal S1, S2; RRR; no murmur, heart sounds are somewhat distant Lungs: Rales on the right, egophony on the left.  Decreased breath sounds on the left. Abd: soft, obese, nontender, no hepatomegaly  Ext:  no edema Musculoskeletal:  No deformities, BUE and BLE strength normal and equal Skin: warm and dry  Neuro:  CNs 2-12 intact, no focal abnormalities noted Psych:  Normal affect   EKG:  The EKG was personally reviewed and demonstrates: Probable atrial tachycardia at 130 bpm Telemetry:  Telemetry was personally reviewed and demonstrates: Probable atrial tachycardia at 130 bpm  Relevant CV Studies: None  Laboratory Data:  Chemistry Recent Labs  Lab December 09, 2018 1014 12-09-2018 1041  NA 132* 130*  K 4.6 4.6  CL 91* 93*  CO2 28  --   GLUCOSE 88 83  BUN 73* 69*  CREATININE 2.56* 2.80*  CALCIUM 7.9*  --   GFRNONAA 26*  --   GFRAA 30*  --   ANIONGAP 13  --     Recent Labs  Lab 2018-12-09 1014  PROT 5.6*  ALBUMIN 1.7*  AST 34  ALT 19  ALKPHOS 114  BILITOT 0.9   Hematology Recent Labs  Lab Dec 09, 2018 1014 12/09/18 1041  WBC 23.8*  --   RBC 6.07*  --   HGB 11.7* 13.9  HCT 39.9 41.0  MCV 65.7*  --   MCH 19.3*  --   MCHC 29.3*  --   RDW 20.0*  --   PLT 208  --    Cardiac Enzymes Recent Labs  Lab 2018/12/09 1014  TROPONINI <0.03   No results for input(s): TROPIPOC in the last 168 hours.  BNP Recent Labs  Lab 12/09/2018 1017  BNP 236.1*    DDimer  Recent Labs  Lab 12/09/2018 1024  DDIMER 2.78*    Radiology/Studies:  Ct Abdomen Pelvis Wo Contrast  Result Date: 12/09/18 CLINICAL DATA:  57 year old male with history of chest pain, right-sided flank pain and generalized weakness since yesterday. EXAM: CT CHEST, ABDOMEN AND PELVIS WITHOUT CONTRAST TECHNIQUE: Multidetector CT imaging of the chest, abdomen and pelvis was performed following the standard protocol without IV contrast. COMPARISON:  CT the chest 04/05/2008. CT the abdomen and pelvis 04/03/2008. FINDINGS: CT CHEST FINDINGS Cardiovascular: Heart size is normal. Pericardial thickening and trace amount of pericardial fluid with pericardial calcification. Narrowing of the cardiac contours, suggesting potential  constrictive physiology. There is aortic atherosclerosis, as well as atherosclerosis of the great vessels of the mediastinum and the coronary arteries, including calcified atherosclerotic plaque in the left anterior descending and right coronary arteries. Mediastinum/Nodes: No pathologically enlarged mediastinal or hilar lymph nodes. Please note that accurate exclusion of hilar adenopathy is limited on noncontrast CT scans. Esophagus is unremarkable in appearance. No axillary lymphadenopathy. Lungs/Pleura: Large left pleural effusion with near complete passive atelectasis throughout the left lung. Minimal residual aerated left upper lobe and superior segment of the left lower lobe. Right lung is well aerated. No right-sided pleural effusion. No definite suspicious appearing pulmonary nodules or masses are noted. Musculoskeletal: There are no aggressive appearing lytic or blastic lesions noted in the visualized portions of the skeleton. CT ABDOMEN PELVIS FINDINGS Hepatobiliary: No definite suspicious cystic or solid hepatic lesions are confidently identified on today's noncontrast CT examination. Mild diffuse low attenuation throughout the hepatic parenchyma, indicative of hepatic steatosis. Liver also has a shrunken appearance and nodular contour, indicative of underlying cirrhosis. Gallbladder is poorly demonstrated. Pancreas: No definite pancreatic mass or peripancreatic fluid or inflammatory changes are noted on today's noncontrast CT examination. Spleen: Unremarkable. Adrenals/Urinary Tract: There are no abnormal calcifications within the collecting system of either kidney, along the course of either ureter, or within the lumen of the urinary bladder. No hydroureteronephrosis or perinephric stranding to suggest urinary tract obstruction at this time. The unenhanced appearance of the kidneys is unremarkable bilaterally. Unenhanced appearance of the urinary bladder is normal. Bilateral adrenal glands are normal in  appearance. Stomach/Bowel: Normal appearance of the stomach. No pathologic dilatation of small bowel or colon. A few scattered colonic diverticulae are noted, without definite surrounding inflammatory changes to strongly suggest an acute  diverticulitis at this time (assessment is limited by the presence of a small volume of ascites). Normal appendix. Vascular/Lymphatic: No atherosclerotic calcifications noted in the abdominal or pelvic vasculature. Multiple prominent borderline enlarged and mildly enlarged pelvic lymph nodes measuring up 2 1.3 cm in short axis in the right external iliac nodal station, nonspecific and favored to be reactive. Reproductive: Prostate gland and seminal vesicles are unremarkable in appearance. Other: Small volume of ascites. No pneumoperitoneum. Diffuse body wall edema. Musculoskeletal: There are no aggressive appearing lytic or blastic lesions noted in the visualized portions of the skeleton. IMPRESSION: 1. Large left pleural effusion with near complete passive atelectasis throughout the left lung. 2. Chronic pericardial thickening and calcification with morphologic changes in the heart suggestive of constrictive physiology. Correlation with echocardiography is strongly recommended if not already performed. 3. Morphologic changes in the liver compatible with cirrhosis. There is also evidence of hepatic steatosis. 4. Small volume of ascites. 5. Diffuse body wall edema. 6. Mild colonic diverticulosis. No definite findings to strongly suggest an acute diverticulitis at this time (assessment is slightly limited by the presence of ascites). 7. Additional incidental findings, as above. Electronically Signed   By: Trudie Reed M.D.   On: 12/13/18 15:06   Ct Chest Wo Contrast  Result Date: 2018-12-13 CLINICAL DATA:  57 year old male with history of chest pain, right-sided flank pain and generalized weakness since yesterday. EXAM: CT CHEST, ABDOMEN AND PELVIS WITHOUT CONTRAST  TECHNIQUE: Multidetector CT imaging of the chest, abdomen and pelvis was performed following the standard protocol without IV contrast. COMPARISON:  CT the chest 04/05/2008. CT the abdomen and pelvis 04/03/2008. FINDINGS: CT CHEST FINDINGS Cardiovascular: Heart size is normal. Pericardial thickening and trace amount of pericardial fluid with pericardial calcification. Narrowing of the cardiac contours, suggesting potential constrictive physiology. There is aortic atherosclerosis, as well as atherosclerosis of the great vessels of the mediastinum and the coronary arteries, including calcified atherosclerotic plaque in the left anterior descending and right coronary arteries. Mediastinum/Nodes: No pathologically enlarged mediastinal or hilar lymph nodes. Please note that accurate exclusion of hilar adenopathy is limited on noncontrast CT scans. Esophagus is unremarkable in appearance. No axillary lymphadenopathy. Lungs/Pleura: Large left pleural effusion with near complete passive atelectasis throughout the left lung. Minimal residual aerated left upper lobe and superior segment of the left lower lobe. Right lung is well aerated. No right-sided pleural effusion. No definite suspicious appearing pulmonary nodules or masses are noted. Musculoskeletal: There are no aggressive appearing lytic or blastic lesions noted in the visualized portions of the skeleton. CT ABDOMEN PELVIS FINDINGS Hepatobiliary: No definite suspicious cystic or solid hepatic lesions are confidently identified on today's noncontrast CT examination. Mild diffuse low attenuation throughout the hepatic parenchyma, indicative of hepatic steatosis. Liver also has a shrunken appearance and nodular contour, indicative of underlying cirrhosis. Gallbladder is poorly demonstrated. Pancreas: No definite pancreatic mass or peripancreatic fluid or inflammatory changes are noted on today's noncontrast CT examination. Spleen: Unremarkable. Adrenals/Urinary Tract:  There are no abnormal calcifications within the collecting system of either kidney, along the course of either ureter, or within the lumen of the urinary bladder. No hydroureteronephrosis or perinephric stranding to suggest urinary tract obstruction at this time. The unenhanced appearance of the kidneys is unremarkable bilaterally. Unenhanced appearance of the urinary bladder is normal. Bilateral adrenal glands are normal in appearance. Stomach/Bowel: Normal appearance of the stomach. No pathologic dilatation of small bowel or colon. A few scattered colonic diverticulae are noted, without definite surrounding inflammatory changes to strongly  suggest an acute diverticulitis at this time (assessment is limited by the presence of a small volume of ascites). Normal appendix. Vascular/Lymphatic: No atherosclerotic calcifications noted in the abdominal or pelvic vasculature. Multiple prominent borderline enlarged and mildly enlarged pelvic lymph nodes measuring up 2 1.3 cm in short axis in the right external iliac nodal station, nonspecific and favored to be reactive. Reproductive: Prostate gland and seminal vesicles are unremarkable in appearance. Other: Small volume of ascites. No pneumoperitoneum. Diffuse body wall edema. Musculoskeletal: There are no aggressive appearing lytic or blastic lesions noted in the visualized portions of the skeleton. IMPRESSION: 1. Large left pleural effusion with near complete passive atelectasis throughout the left lung. 2. Chronic pericardial thickening and calcification with morphologic changes in the heart suggestive of constrictive physiology. Correlation with echocardiography is strongly recommended if not already performed. 3. Morphologic changes in the liver compatible with cirrhosis. There is also evidence of hepatic steatosis. 4. Small volume of ascites. 5. Diffuse body wall edema. 6. Mild colonic diverticulosis. No definite findings to strongly suggest an acute diverticulitis at  this time (assessment is slightly limited by the presence of ascites). 7. Additional incidental findings, as above. Electronically Signed   By: Trudie Reedaniel  Entrikin M.D.   On: 2018/02/04 15:06   Dg Chest Port 1 View  Result Date: 2018/02/04 CLINICAL DATA:  Chest pain, RIGHT flank pain and generalized weakness since yesterday EXAM: PORTABLE CHEST 1 VIEW COMPARISON:  Portable exam 1024 hours compared to 06/17/2018 FINDINGS: Enlargement of cardiac silhouette. Pulmonary vascular congestion. Large LEFT pleural effusion with significant opacification of the inferior 2/3 of the LEFT lung. Mild central peribronchial thickening and RIGHT basilar atelectasis. No pneumothorax or acute osseous findings. IMPRESSION: Large LEFT pleural effusion with significant LEFT lung atelectasis; underlying mass and infiltrate not excluded in this setting. Bronchitic changes with minimal RIGHT basilar atelectasis. Enlargement of cardiac silhouette. Electronically Signed   By: Ulyses SouthwardMark  Boles M.D.   On: 2018/02/04 10:41    Assessment and Plan:   1. Acute on chronic diastolic heart failure -the patient has anasarca.  He denies problems with medical or dietary noncompliance.  He will need intravenous diuresis, and will likely require dialysis sooner than later. 2. Large left pleural effusion -while he has no fevers, his white blood cell count is elevated, and I would be quite concerned about the possibility of empyema and would recommend chest tube either by pulmonary or cardiothoracic surgery. 3. Atrial tachycardia -I suspect this is secondary to his underlying disease process.  At this point, I would not recommend antiarrhythmic drug therapy, at least until his volume status is improved 4. Acute on chronic renal failure, stage IV -would consider nephrology consultation.  With aggressive diuresis, he is almost certain to develop renal failure.      For questions or updates, please contact [facility] Please consult [email or web address]  for contact info under     Signed, Lewayne BuntingGregg Rena Hunke, MD  2018/02/04 4:49 PM

## 2018-11-21 NOTE — ED Notes (Signed)
Patient transported to CT 

## 2018-11-21 NOTE — Progress Notes (Signed)
ANTICOAGULATION CONSULT NOTE - Initial Consult  Pharmacy Consult for heparin Indication: atrial fibrillation/rule-out DVT  Allergies  Allergen Reactions  . Nsaids Other (See Comments)    Affect kidneys negatively  . Sulfa Antibiotics Diarrhea    Patient Measurements: Height: 6' (182.9 cm) Weight: (!) 305 lb 8 oz (138.6 kg) IBW/kg (Calculated) : 77.6 Heparin Dosing Weight: 109.5 kg  Vital Signs: Temp: 98.1 F (36.7 C) (11/24 0957) Temp Source: Oral (11/24 0957) BP: 113/83 (11/24 1430) Pulse Rate: 130 (11/24 1430)  Labs: Recent Labs    2018/05/06 1014 2018/05/06 1041  HGB 11.7* 13.9  HCT 39.9 41.0  PLT 208  --   CREATININE 2.56* 2.80*  TROPONINI <0.03  --     Estimated Creatinine Clearance: 41 mL/min (A) (by C-G formula based on SCr of 2.8 mg/dL (H)).   Medical History: Past Medical History:  Diagnosis Date  . Congestive heart disease (HCC)   . DVT (deep venous thrombosis) (HCC)   . Hypercholesteremia   . Hypertension   . Renal disorder     Medications:  Scheduled:  . heparin  5,000 Units Intravenous Once    Assessment: 57 yom from nursing home complaining of SOB, CP, and progressive LE swelling - not on anticoag PTA. EKG showing atrial tachycardia vs Afib.  Hgb 13.9, plt 208. D-dimer 2.78. Scr 2.8 (CrCl 41 mL/min). No s/sx of bleeding.   Goal of Therapy:  Heparin level 0.3-0.7 units/ml Monitor platelets by anticoagulation protocol: Yes   Plan:  Give 5000 units bolus x 1 Start heparin infusion at 1500 units/hr Check anti-Xa level in 6 hours and daily while on heparin Continue to monitor H&H and platelets  Sherron MondayKimberly Jacobe Study, PharmD Clinical Pharmacist  Pager: [phone] Phone: [phone] December 21, 2018,4:41 PM

## 2018-11-21 NOTE — H&P (Addendum)
History and Physical    Justin JeffersonWilliam L [name] BJY:[id number] DOB: [birth year] DOA: 10-20-2018  I have briefly [facility] the patient[facility]  PCP: Juluis RainierBarnes, Elizabeth, MD  Patient coming from: SNF  Chief Complaint: SOB, chest pain, leg swelling  HPI: Justin [name] is a 57 y.o. male with medical history significant of CHFpEF, prior DVT no longer on anticoagulation, hypertension, hyperlipidemia, chronic kidney disease stage IV with baseline creatinine 2-2.3, currently residing in an SNF who comes to the hospital with chief complaint of shortness of breath, and episode of chest pain last night as well as progressive lower extremity swelling.  He tells me that he noticed fluid building up on his legs, and also has been complaining of right leg being more swollen than the left and tells me his right calf is hurting more.  He also had couple episodes of chest pains last night.  He denies any fever or chills, denies any cough or chest congestion, no sputum production.  Denies any sore throat.  He denies any abdominal pain, nausea vomiting or diarrhea.  He does report his urine was a little bit darker 3 days ago, and he got a urine culture that was sent by his SNF but does not results.  He denies any dysuria.  Currently he does not have any chest pain, he denies any palpitations.  He thinks he has gained about 2 pounds in the last few days.  ED Course: In the emergency room patient is afebrile, normotensive, was noted to be tachypneic with a respiratory rate in the 30s and tachycardic in the 130s.  He was hypoxic requiring 3 L supplemental oxygen.  His blood work shows a creatinine of 2.8, sodium 130, his BNP is slightly elevated to 36.  CBC pertinent for a white count of 23.8.  D-dimer is elevated 2.7.  He underwent a CT scan of the chest which showed large left pleural effusion with near complete passive atelectasis throughout the left lung, also chronic pericardial thickening  and calcification with morphologic changes in the heart suggestive of constrictive physiology.  EKG with rhythm not entirely clear but looks like A. Fib vs atrial tachycardia  Review of Systems: As per HPI otherwise 10 point review of systems negative.   Past Medical History:  Diagnosis Date  . Congestive heart disease (HCC)   . DVT (deep venous thrombosis) (HCC)   . Hypercholesteremia   . Hypertension   . Renal disorder     History reviewed. No pertinent surgical history.   reports that he has quit smoking. He has never used smokeless tobacco. He reports that he does not drink alcohol or use drugs.  Allergies  Allergen Reactions  . Nsaids Other (See Comments)    Affect kidneys negatively  . Sulfa Antibiotics Diarrhea    Family History  Problem Relation Age of Onset  . Diabetes Mellitus II Neg Hx   . CAD Neg Hx     Prior to Admission medications   Medication Sig Start Date End Date Taking? Authorizing Provider  acetaminophen (TYLENOL) 500 MG tablet Take 1,000 mg by mouth every 6 (six) hours as needed (for pain or headaches).   Yes [provider]  albuterol (PROVENTIL HFA;VENTOLIN HFA) 108 (90 BASE) MCG/ACT inhaler Inhale 2 puffs into the lungs every 4 (four) hours. Patient taking differently: Inhale 2 puffs into the lungs every 4 (four) hours as needed for wheezing or shortness of breath.  11/08/13  Yes Elenora GammaBradshaw, Samuel L, MD  ammonium lactate (LAC-HYDRIN) 12 % lotion Apply 1 application topically 2 (two) times daily. Apply to bilateral legs for dry skin   Yes [provider]  aspirin EC 81 MG tablet Take 81 mg by mouth daily.   Yes [provider]  Cholecalciferol 25 MCG (1000 UT) capsule Take 2,000 Units by mouth daily.   Yes [provider]  furosemide (LASIX) 40 MG tablet Take 40 mg by mouth as needed (for fluid for a 5lb weight gain in 48 hours).   Yes [provider]  furosemide (LASIX) 80 MG tablet Take 80 mg by mouth 2 (two)  times daily.   Yes [provider]  metolazone (ZAROXOLYN) 2.5 MG tablet Take 1 tablet (2.5 mg ) every Monday and Friday morning 06/18/18  Yes Emokpae, Courage, MD  Multiple Vitamins-Minerals (ONE-A-DAY MENS 50+ ADVANTAGE) TABS Take 1 tablet by mouth daily.    Yes [provider]  potassium chloride SA (K-DUR,KLOR-CON) 20 MEQ tablet Take 20 mEq by mouth daily with breakfast.   Yes [provider]  rosuvastatin (CRESTOR) 20 MG tablet Take 20 mg by mouth at bedtime.    Yes [provider]    Physical Exam: Vitals:   Nov 23, 2018 1300 11/09/2018 1330 23-Nov-2018 1400 23-Nov-2018 1430  BP: 102/64 115/78 (!) 104/94 113/83  Pulse: (!) 129 (!) 129 (!) 130 (!) 130  Resp: 20 18 20 20   Temp:      TempSrc:      SpO2: 98% 98% 97% 98%  Weight:      Height:          Constitutional: Tachypneic Eyes: PERRL, lids and conjunctivae normal ENMT: Mucous membranes are moist. Posterior pharynx clear of any exudate or lesions Neck: normal, supple, no masses, no thyromegaly Respiratory: Decreased breath sounds on the left, no wheezing.  Increased respiratory effort. No accessory muscle use.   Cardiovascular: irregular, tachycardic, 2+ LE extremity edema / anasarca. 2+ pedal pulses.  Abdomen: Obese, no tenderness, no masses palpated. Bowel sounds positive.  Musculoskeletal: no clubbing / cyanosis. Normal muscle tone.  Skin: No rashes seen Neurologic: CN 2-12 grossly intact. Strength 5/5 in all 4.  Psychiatric: Normal judgment and insight. Alert and oriented x 3. Normal mood.   Labs on Admission: I have personally reviewed following labs and imaging studies  CBC: Recent Labs  Lab November 23, 2018 1014 11/06/2018 1041  WBC 23.8*  --   NEUTROABS 21.4*  --   HGB 11.7* 13.9  HCT 39.9 41.0  MCV 65.7*  --   PLT 208  --    Basic Metabolic Panel: Recent Labs  Lab 11/01/2018 1014 11/07/2018 1041  NA 132* 130*  K 4.6 4.6  CL 91* 93*  CO2 28  --   GLUCOSE 88 83  BUN 73* 69*  CREATININE  2.56* 2.80*  CALCIUM 7.9*  --    GFR: Estimated Creatinine Clearance: 41 mL/min (A) (by C-G formula based on SCr of 2.8 mg/dL (H)). Liver Function Tests: Recent Labs  Lab 23-Nov-2018 1014  AST 34  ALT 19  ALKPHOS 114  BILITOT 0.9  PROT 5.6*  ALBUMIN 1.7*   No results for input(s): LIPASE, AMYLASE in the last 168 hours. No results for input(s): AMMONIA in the last 168 hours. Coagulation Profile: No results for input(s): INR, PROTIME in the last 168 hours. Cardiac Enzymes: Recent Labs  Lab 11/07/2018 1014  TROPONINI <0.03   BNP (last 3 results) No results for input(s): PROBNP in the last 8760 hours. HbA1C: No results  for input(s): HGBA1C in the last 72 hours. CBG: No results for input(s): GLUCAP in the last 168 hours. Lipid Profile: No results for input(s): CHOL, HDL, LDLCALC, TRIG, CHOLHDL, LDLDIRECT in the last 72 hours. Thyroid Function Tests: No results for input(s): TSH, T4TOTAL, FREET4, T3FREE, THYROIDAB in the last 72 hours. Anemia Panel: No results for input(s): VITAMINB12, FOLATE, FERRITIN, TIBC, IRON, RETICCTPCT in the last 72 hours. Urine analysis:    Component Value Date/Time   COLORURINE YELLOW 11/12/2018 1017   APPEARANCEUR CLEAR 11/09/2018 1017   LABSPEC 1.015 11/20/2018 1017   PHURINE 5.0 11/08/2018 1017   GLUCOSEU NEGATIVE 11/22/2018 1017   HGBUR NEGATIVE 11/16/2018 1017   BILIRUBINUR NEGATIVE 11/27/2018 1017   KETONESUR NEGATIVE 11/19/2018 1017   PROTEINUR NEGATIVE 11/18/2018 1017   UROBILINOGEN 0.2 06/10/2014 1535   NITRITE NEGATIVE 11/14/2018 1017   LEUKOCYTESUR NEGATIVE 11/17/2018 1017     Radiological Exams on Admission: Ct Abdomen Pelvis Wo Contrast  Result Date: 11/26/2018 CLINICAL DATA:  58 year old male with history of chest pain, right-sided flank pain and generalized weakness since yesterday. EXAM: CT CHEST, ABDOMEN AND PELVIS WITHOUT CONTRAST TECHNIQUE: Multidetector CT imaging of the chest, abdomen and pelvis was performed following  the standard protocol without IV contrast. COMPARISON:  CT the chest 04/05/2008. CT the abdomen and pelvis 04/03/2008. FINDINGS: CT CHEST FINDINGS Cardiovascular: Heart size is normal. Pericardial thickening and trace amount of pericardial fluid with pericardial calcification. Narrowing of the cardiac contours, suggesting potential constrictive physiology. There is aortic atherosclerosis, as well as atherosclerosis of the great vessels of the mediastinum and the coronary arteries, including calcified atherosclerotic plaque in the left anterior descending and right coronary arteries. Mediastinum/Nodes: No pathologically enlarged mediastinal or hilar lymph nodes. Please note that accurate exclusion of hilar adenopathy is limited on noncontrast CT scans. Esophagus is unremarkable in appearance. No axillary lymphadenopathy. Lungs/Pleura: Large left pleural effusion with near complete passive atelectasis throughout the left lung. Minimal residual aerated left upper lobe and superior segment of the left lower lobe. Right lung is well aerated. No right-sided pleural effusion. No definite suspicious appearing pulmonary nodules or masses are noted. Musculoskeletal: There are no aggressive appearing lytic or blastic lesions noted in the visualized portions of the skeleton. CT ABDOMEN PELVIS FINDINGS Hepatobiliary: No definite suspicious cystic or solid hepatic lesions are confidently identified on today's noncontrast CT examination. Mild diffuse low attenuation throughout the hepatic parenchyma, indicative of hepatic steatosis. Liver also has a shrunken appearance and nodular contour, indicative of underlying cirrhosis. Gallbladder is poorly demonstrated. Pancreas: No definite pancreatic mass or peripancreatic fluid or inflammatory changes are noted on today's noncontrast CT examination. Spleen: Unremarkable. Adrenals/Urinary Tract: There are no abnormal calcifications within the collecting system of either kidney, along the  course of either ureter, or within the lumen of the urinary bladder. No hydroureteronephrosis or perinephric stranding to suggest urinary tract obstruction at this time. The unenhanced appearance of the kidneys is unremarkable bilaterally. Unenhanced appearance of the urinary bladder is normal. Bilateral adrenal glands are normal in appearance. Stomach/Bowel: Normal appearance of the stomach. No pathologic dilatation of small bowel or colon. A few scattered colonic diverticulae are noted, without definite surrounding inflammatory changes to strongly suggest an acute diverticulitis at this time (assessment is limited by the presence of a small volume of ascites). Normal appendix. Vascular/Lymphatic: No atherosclerotic calcifications noted in the abdominal or pelvic vasculature. Multiple prominent borderline enlarged and mildly enlarged pelvic lymph nodes measuring up 2 1.3 cm in short axis in the right external  iliac nodal station, nonspecific and favored to be reactive. Reproductive: Prostate gland and seminal vesicles are unremarkable in appearance. Other: Small volume of ascites. No pneumoperitoneum. Diffuse body wall edema. Musculoskeletal: There are no aggressive appearing lytic or blastic lesions noted in the visualized portions of the skeleton. IMPRESSION: 1. Large left pleural effusion with near complete passive atelectasis throughout the left lung. 2. Chronic pericardial thickening and calcification with morphologic changes in the heart suggestive of constrictive physiology. Correlation with echocardiography is strongly recommended if not already performed. 3. Morphologic changes in the liver compatible with cirrhosis. There is also evidence of hepatic steatosis. 4. Small volume of ascites. 5. Diffuse body wall edema. 6. Mild colonic diverticulosis. No definite findings to strongly suggest an acute diverticulitis at this time (assessment is slightly limited by the presence of ascites). 7. Additional  incidental findings, as above. Electronically Signed   By: Trudie Reed M.D.   On: 11/16/2018 15:06   Ct Chest Wo Contrast  Result Date: 11/13/2018 CLINICAL DATA:  57 year old male with history of chest pain, right-sided flank pain and generalized weakness since yesterday. EXAM: CT CHEST, ABDOMEN AND PELVIS WITHOUT CONTRAST TECHNIQUE: Multidetector CT imaging of the chest, abdomen and pelvis was performed following the standard protocol without IV contrast. COMPARISON:  CT the chest 04/05/2008. CT the abdomen and pelvis 04/03/2008. FINDINGS: CT CHEST FINDINGS Cardiovascular: Heart size is normal. Pericardial thickening and trace amount of pericardial fluid with pericardial calcification. Narrowing of the cardiac contours, suggesting potential constrictive physiology. There is aortic atherosclerosis, as well as atherosclerosis of the great vessels of the mediastinum and the coronary arteries, including calcified atherosclerotic plaque in the left anterior descending and right coronary arteries. Mediastinum/Nodes: No pathologically enlarged mediastinal or hilar lymph nodes. Please note that accurate exclusion of hilar adenopathy is limited on noncontrast CT scans. Esophagus is unremarkable in appearance. No axillary lymphadenopathy. Lungs/Pleura: Large left pleural effusion with near complete passive atelectasis throughout the left lung. Minimal residual aerated left upper lobe and superior segment of the left lower lobe. Right lung is well aerated. No right-sided pleural effusion. No definite suspicious appearing pulmonary nodules or masses are noted. Musculoskeletal: There are no aggressive appearing lytic or blastic lesions noted in the visualized portions of the skeleton. CT ABDOMEN PELVIS FINDINGS Hepatobiliary: No definite suspicious cystic or solid hepatic lesions are confidently identified on today's noncontrast CT examination. Mild diffuse low attenuation throughout the hepatic parenchyma, indicative  of hepatic steatosis. Liver also has a shrunken appearance and nodular contour, indicative of underlying cirrhosis. Gallbladder is poorly demonstrated. Pancreas: No definite pancreatic mass or peripancreatic fluid or inflammatory changes are noted on today's noncontrast CT examination. Spleen: Unremarkable. Adrenals/Urinary Tract: There are no abnormal calcifications within the collecting system of either kidney, along the course of either ureter, or within the lumen of the urinary bladder. No hydroureteronephrosis or perinephric stranding to suggest urinary tract obstruction at this time. The unenhanced appearance of the kidneys is unremarkable bilaterally. Unenhanced appearance of the urinary bladder is normal. Bilateral adrenal glands are normal in appearance. Stomach/Bowel: Normal appearance of the stomach. No pathologic dilatation of small bowel or colon. A few scattered colonic diverticulae are noted, without definite surrounding inflammatory changes to strongly suggest an acute diverticulitis at this time (assessment is limited by the presence of a small volume of ascites). Normal appendix. Vascular/Lymphatic: No atherosclerotic calcifications noted in the abdominal or pelvic vasculature. Multiple prominent borderline enlarged and mildly enlarged pelvic lymph nodes measuring up 2 1.3 cm in short axis in  the right external iliac nodal station, nonspecific and favored to be reactive. Reproductive: Prostate gland and seminal vesicles are unremarkable in appearance. Other: Small volume of ascites. No pneumoperitoneum. Diffuse body wall edema. Musculoskeletal: There are no aggressive appearing lytic or blastic lesions noted in the visualized portions of the skeleton. IMPRESSION: 1. Large left pleural effusion with near complete passive atelectasis throughout the left lung. 2. Chronic pericardial thickening and calcification with morphologic changes in the heart suggestive of constrictive physiology. Correlation  with echocardiography is strongly recommended if not already performed. 3. Morphologic changes in the liver compatible with cirrhosis. There is also evidence of hepatic steatosis. 4. Small volume of ascites. 5. Diffuse body wall edema. 6. Mild colonic diverticulosis. No definite findings to strongly suggest an acute diverticulitis at this time (assessment is slightly limited by the presence of ascites). 7. Additional incidental findings, as above. Electronically Signed   By: Trudie Reed M.D.   On: 11/27/2018 15:06   Dg Chest Port 1 View  Result Date: 11/19/2018 CLINICAL DATA:  Chest pain, RIGHT flank pain and generalized weakness since yesterday EXAM: PORTABLE CHEST 1 VIEW COMPARISON:  Portable exam 1024 hours compared to 06/17/2018 FINDINGS: Enlargement of cardiac silhouette. Pulmonary vascular congestion. Large LEFT pleural effusion with significant opacification of the inferior 2/3 of the LEFT lung. Mild central peribronchial thickening and RIGHT basilar atelectasis. No pneumothorax or acute osseous findings. IMPRESSION: Large LEFT pleural effusion with significant LEFT lung atelectasis; underlying mass and infiltrate not excluded in this setting. Bronchitic changes with minimal RIGHT basilar atelectasis. Enlargement of cardiac silhouette. Electronically Signed   By: Ulyses Southward M.D.   On: 11/20/2018 10:41    EKG: Independently reviewed.  Probable A. fib with RVR  Assessment/Plan Active Problems:   CHF (congestive heart failure) (HCC)   Acute on chronic diastolic CHF -Patient with significant evidence of fluid overload with pitting lower extremity edema as well as large left-sided pleural effusion -Admit on CHF pathway, updated 2D echo given CT findings, placed on IV Lasix, strict I's and O's, daily weights  ? A tachycardia vs A. fib with RVR -EKG of poor quality, cardiology consulted and appreciate input.   Acute hypoxic respiratory failure due to pleural effusion with lung  atelectasis -Started IV diuretics, asked for thoracentesis -Continue oxygen as needed to keep sats above 90%  Chronic kidney disease stage IV -Baseline creatinine 2-2.3, was 2.5 initially in the ED then it was rechecked and it was 2.8.  I will provide IV Lasix and continue to closely monitor.  Elevated d-dimer/history of DVT -Patient reports right calf pain, will obtain a lower extremity ultrasound to rule out DVT as well as a VQ scan given hypoxia and elevated heart rate -Empirically placed on IV heparin meanwhile  Hyponatremia -Likely fluid overloaded, monitor on Lasix  Leukocytosis -No clear-cut evidence of infection, he did have some nonspecific urinary changes but I am not clear whether this was infection or he was peeing less and getting fluid overloaded and his urine was more concentrated.  Urinalysis here without any evidence of infection  Chronic pericardial thickening -2D echo pending  Liver cirrhosis -Seen on the CT scan, no significant evidence of thrombocytopenia or any other issues with this   DVT prophylaxis: Heparin infusion Code Status: Full code Family Communication: Wife present at bedside Disposition Plan: Admit to stepdown Consults called: Cardiology, discussed with Dr. Alfredo Martinez, MD, PhD [facility] Pager [phone]  If 7PM-7AM, please contact night-coverage [email or web address] Password [email or web address]  11/09/2018,  3:52 PM

## 2018-11-22 ENCOUNTER — Other Ambulatory Visit (HOSPITAL_COMMUNITY): Payer: Medicare HMO

## 2018-11-22 ENCOUNTER — Inpatient Hospital Stay (HOSPITAL_COMMUNITY): Payer: Medicare HMO

## 2018-11-22 DIAGNOSIS — J90 Pleural effusion, not elsewhere classified: Secondary | ICD-10-CM

## 2018-11-22 DIAGNOSIS — I959 Hypotension, unspecified: Secondary | ICD-10-CM | POA: Diagnosis present

## 2018-11-22 DIAGNOSIS — R609 Edema, unspecified: Secondary | ICD-10-CM

## 2018-11-22 DIAGNOSIS — N183 Chronic kidney disease, stage 3 (moderate): Secondary | ICD-10-CM

## 2018-11-22 DIAGNOSIS — R7989 Other specified abnormal findings of blood chemistry: Secondary | ICD-10-CM

## 2018-11-22 DIAGNOSIS — A419 Sepsis, unspecified organism: Secondary | ICD-10-CM | POA: Diagnosis present

## 2018-11-22 DIAGNOSIS — R079 Chest pain, unspecified: Secondary | ICD-10-CM

## 2018-11-22 DIAGNOSIS — I311 Chronic constrictive pericarditis: Secondary | ICD-10-CM | POA: Diagnosis present

## 2018-11-22 DIAGNOSIS — M7989 Other specified soft tissue disorders: Secondary | ICD-10-CM

## 2018-11-22 DIAGNOSIS — J9601 Acute respiratory failure with hypoxia: Secondary | ICD-10-CM | POA: Diagnosis present

## 2018-11-22 DIAGNOSIS — R6521 Severe sepsis with septic shock: Secondary | ICD-10-CM

## 2018-11-22 DIAGNOSIS — R0600 Dyspnea, unspecified: Secondary | ICD-10-CM

## 2018-11-22 DIAGNOSIS — I5033 Acute on chronic diastolic (congestive) heart failure: Secondary | ICD-10-CM

## 2018-11-22 DIAGNOSIS — J869 Pyothorax without fistula: Secondary | ICD-10-CM

## 2018-11-22 DIAGNOSIS — G9340 Encephalopathy, unspecified: Secondary | ICD-10-CM

## 2018-11-22 DIAGNOSIS — E162 Hypoglycemia, unspecified: Secondary | ICD-10-CM | POA: Diagnosis present

## 2018-11-22 LAB — BODY FLUID CELL COUNT WITH DIFFERENTIAL
Eos, Fluid: 0 %
LYMPHS FL: 3 %
MONOCYTE-MACROPHAGE-SEROUS FLUID: 1 % — AB (ref 50–90)
NEUTROPHIL FLUID: 96 % — AB (ref 0–25)
Other Cells, Fluid: 0 %
Total Nucleated Cell Count, Fluid: 6400 cu mm — ABNORMAL HIGH (ref 0–1000)

## 2018-11-22 LAB — LACTATE DEHYDROGENASE, PLEURAL OR PERITONEAL FLUID: LD, Fluid: 1416 U/L — ABNORMAL HIGH (ref 3–23)

## 2018-11-22 LAB — GLUCOSE, CAPILLARY
GLUCOSE-CAPILLARY: 40 mg/dL — AB (ref 70–99)
GLUCOSE-CAPILLARY: 62 mg/dL — AB (ref 70–99)
GLUCOSE-CAPILLARY: 74 mg/dL (ref 70–99)
GLUCOSE-CAPILLARY: 49 mg/dL — AB (ref 70–99)
GLUCOSE-CAPILLARY: 62 mg/dL — AB (ref 70–99)
Glucose-Capillary: 106 mg/dL — ABNORMAL HIGH (ref 70–99)
Glucose-Capillary: 118 mg/dL — ABNORMAL HIGH (ref 70–99)
Glucose-Capillary: 143 mg/dL — ABNORMAL HIGH (ref 70–99)
Glucose-Capillary: 55 mg/dL — ABNORMAL LOW (ref 70–99)
Glucose-Capillary: 59 mg/dL — ABNORMAL LOW (ref 70–99)
Glucose-Capillary: 81 mg/dL (ref 70–99)
Glucose-Capillary: 87 mg/dL (ref 70–99)

## 2018-11-22 LAB — BASIC METABOLIC PANEL
ANION GAP: 12 (ref 5–15)
BUN: 75 mg/dL — ABNORMAL HIGH (ref 6–20)
CO2: 28 mmol/L (ref 22–32)
CREATININE: 3.03 mg/dL — AB (ref 0.61–1.24)
Calcium: 7.6 mg/dL — ABNORMAL LOW (ref 8.9–10.3)
Chloride: 90 mmol/L — ABNORMAL LOW (ref 98–111)
GFR, EST AFRICAN AMERICAN: 24 mL/min — AB (ref >60)
GFR, EST NON AFRICAN AMERICAN: 21 mL/min — AB (ref >60)
Glucose, Bld: 68 mg/dL — ABNORMAL LOW (ref 70–99)
Potassium: 4.6 mmol/L (ref 3.5–5.1)
SODIUM: 130 mmol/L — AB (ref 135–145)

## 2018-11-22 LAB — MRSA PCR SCREENING: MRSA BY PCR: POSITIVE — AB

## 2018-11-22 LAB — CBC
HCT: 39.6 % (ref 39.0–52.0)
Hemoglobin: 11.4 g/dL — ABNORMAL LOW (ref 13.0–17.0)
MCH: 18.7 pg — ABNORMAL LOW (ref 26.0–34.0)
MCHC: 28.8 g/dL — AB (ref 30.0–36.0)
MCV: 65 fL — ABNORMAL LOW (ref 80.0–100.0)
NRBC: 0.4 % — AB (ref 0.0–0.2)
PLATELETS: 242 10*3/uL (ref 150–400)
RBC: 6.09 MIL/uL — ABNORMAL HIGH (ref 4.22–5.81)
RDW: 19.9 % — ABNORMAL HIGH (ref 11.5–15.5)
WBC: 24.9 10*3/uL — AB (ref 4.0–10.5)

## 2018-11-22 LAB — HEPARIN LEVEL (UNFRACTIONATED)
HEPARIN UNFRACTIONATED: 0.32 [IU]/mL (ref 0.30–0.70)
Heparin Unfractionated: 0.4 IU/mL (ref 0.30–0.70)

## 2018-11-22 LAB — POCT I-STAT 3, ART BLOOD GAS (G3+)
Acid-base deficit: 1 mmol/L (ref 0.0–2.0)
Bicarbonate: 28.1 mmol/L — ABNORMAL HIGH (ref 20.0–28.0)
O2 Saturation: 96 %
Patient temperature: 98.6
TCO2: 30 mmol/L (ref 22–32)
pCO2 arterial: 67.2 mmHg (ref 32.0–48.0)
pH, Arterial: 7.23 — ABNORMAL LOW (ref 7.350–7.450)
pO2, Arterial: 101 mmHg (ref 83.0–108.0)

## 2018-11-22 LAB — PROTEIN, PLEURAL OR PERITONEAL FLUID

## 2018-11-22 LAB — GLUCOSE, PLEURAL OR PERITONEAL FLUID

## 2018-11-22 LAB — LACTIC ACID, PLASMA: Lactic Acid, Venous: 3.5 mmol/L (ref 0.5–1.9)

## 2018-11-22 LAB — PROCALCITONIN: Procalcitonin: 85.25 ng/mL

## 2018-11-22 MED ORDER — SODIUM CHLORIDE 0.9 % IV SOLN: INTRAVENOUS | Status: DC | PRN

## 2018-11-22 MED ORDER — DEXTROSE 50 % IV SOLN
12.5000 g | Freq: Once | INTRAVENOUS | Status: AC
  Administered 2018-11-22: 12.5 g via INTRAVENOUS

## 2018-11-22 MED ORDER — SODIUM CHLORIDE 0.9 % IV BOLUS: 1000.0000 mL | Freq: Once | INTRAVENOUS | Status: DC

## 2018-11-22 MED ORDER — MUPIROCIN 2 % EX OINT
1.0000 "application " | TOPICAL_OINTMENT | Freq: Two times a day (BID) | CUTANEOUS | Status: DC
  Administered 2018-11-22 – 2018-11-23 (×2): 1 via NASAL
  Filled 2018-11-22: qty 22

## 2018-11-22 MED ORDER — DEXTROSE 50 % IV SOLN
INTRAVENOUS | Status: AC
  Administered 2018-11-22: 12.5 g via INTRAVENOUS
  Filled 2018-11-22: qty 50

## 2018-11-22 MED ORDER — DEXTROSE 50 % IV SOLN
25.0000 mL | Freq: Once | INTRAVENOUS | Status: AC
  Administered 2018-11-22: 25 mL via INTRAVENOUS

## 2018-11-22 MED ORDER — FENTANYL CITRATE (PF) 100 MCG/2ML IJ SOLN
INTRAMUSCULAR | Status: AC
  Administered 2018-11-22: 25 ug
  Filled 2018-11-22: qty 2

## 2018-11-22 MED ORDER — ALBUTEROL SULFATE (2.5 MG/3ML) 0.083% IN NEBU
2.5000 mg | INHALATION_SOLUTION | Freq: Three times a day (TID) | RESPIRATORY_TRACT | Status: DC
  Administered 2018-11-22 – 2018-11-23 (×3): 2.5 mg via RESPIRATORY_TRACT
  Filled 2018-11-22 (×3): qty 3

## 2018-11-22 MED ORDER — VANCOMYCIN HCL 10 G IV SOLR
1500.0000 mg | INTRAVENOUS | Status: DC
  Filled 2018-11-22: qty 1500

## 2018-11-22 MED ORDER — LIDOCAINE HCL (PF) 1 % IJ SOLN
INTRAMUSCULAR | Status: AC
  Filled 2018-11-22: qty 10

## 2018-11-22 MED ORDER — DEXTROSE 50 % IV SOLN
INTRAVENOUS | Status: AC
  Administered 2018-11-22: 25 mL via INTRAVENOUS
  Filled 2018-11-22: qty 50

## 2018-11-22 MED ORDER — CALCIUM GLUCONATE-NACL 1-0.675 GM/50ML-% IV SOLN
1.0000 g | Freq: Once | INTRAVENOUS | Status: AC
  Administered 2018-11-22: 1000 mg via INTRAVENOUS
  Filled 2018-11-22: qty 50

## 2018-11-22 MED ORDER — FENTANYL CITRATE (PF) 100 MCG/2ML IJ SOLN: 25.0000 ug | INTRAMUSCULAR | Status: DC | PRN

## 2018-11-22 MED ORDER — CHLORHEXIDINE GLUCONATE CLOTH 2 % EX PADS
6.0000 | MEDICATED_PAD | Freq: Every day | CUTANEOUS | Status: DC
  Administered 2018-11-23: 6 via TOPICAL

## 2018-11-22 MED ORDER — PHENYLEPHRINE HCL-NACL 10-0.9 MG/250ML-% IV SOLN
0.0000 ug/min | INTRAVENOUS | Status: DC
  Administered 2018-11-22: 115 ug/min via INTRAVENOUS
  Administered 2018-11-22: 200 ug/min via INTRAVENOUS
  Administered 2018-11-22: 140 ug/min via INTRAVENOUS
  Administered 2018-11-22 (×2): 200 ug/min via INTRAVENOUS
  Administered 2018-11-22: 50 ug/min via INTRAVENOUS
  Administered 2018-11-22: 200 ug/min via INTRAVENOUS
  Administered 2018-11-22: 115 ug/min via INTRAVENOUS
  Administered 2018-11-23 (×8): 200 ug/min via INTRAVENOUS
  Filled 2018-11-22 (×20): qty 250

## 2018-11-22 MED ORDER — PIPERACILLIN-TAZOBACTAM 3.375 G IVPB
3.3750 g | Freq: Three times a day (TID) | INTRAVENOUS | Status: DC
  Administered 2018-11-22 – 2018-11-23 (×2): 3.375 g via INTRAVENOUS
  Filled 2018-11-22 (×3): qty 50

## 2018-11-22 MED ORDER — DEXTROSE 10 % IV SOLN
INTRAVENOUS | Status: DC
  Administered 2018-11-22: 21:00:00 via INTRAVENOUS

## 2018-11-22 MED ORDER — PIPERACILLIN-TAZOBACTAM 3.375 G IVPB 30 MIN
3.3750 g | Freq: Once | INTRAVENOUS | Status: AC
  Administered 2018-11-22: 3.375 g via INTRAVENOUS
  Filled 2018-11-22: qty 50

## 2018-11-22 MED ORDER — VANCOMYCIN HCL 10 G IV SOLR
2500.0000 mg | Freq: Once | INTRAVENOUS | Status: AC
  Administered 2018-11-22: 2500 mg via INTRAVENOUS
  Filled 2018-11-22: qty 2500

## 2018-11-22 MED ORDER — ALBUMIN HUMAN 25 % IV SOLN
25.0000 g | Freq: Once | INTRAVENOUS | Status: AC
  Administered 2018-11-22: 25 g via INTRAVENOUS
  Filled 2018-11-22: qty 50

## 2018-11-22 NOTE — Progress Notes (Signed)
Hypoglycemic Event  CBG: 59  Treatment: D50 IV 25 mL  Symptoms: None  Follow-up CBG: Time:2336 CBG Result:87  Possible Reasons for Event: Inadequate meal intake  Comments/MD notified:    Della GooCummings, Anjelita Sheahan R

## 2018-11-22 NOTE — Procedures (Signed)
Thoracentesis Procedure Note  Pre-operative Diagnosis: Left Pleural Effusion  Post-operative Diagnosis: same  Indications: Fluid analysis  Procedure Details  Consent: Informed consent was obtained. Risks of the procedure were discussed including: infection, bleeding, pain, pneumothorax.  Under sterile conditions the patient was positioned. Betadine solution and sterile drapes were utilized.  1% plain lidocaine was used to anesthetize the 7th rib space. Fluid was obtained without any difficulties and minimal blood loss.  A dressing was applied to the wound and wound care instructions were provided.   Findings 950 ml of cloudy pleural fluid was obtained. A sample was sent to Pathology for cytogenetics, flow, and cell counts, as well as for infection analysis.  Complications:  None; patient tolerated the procedure well.          Condition: stable  Plan A follow up chest x-ray was ordered. Bed Rest for 0 hours.   Justin [name], [place] [facility]onie Dickens[facility] Pager: [phone]  or [phone] 11/22/2018, 2:09 PM   Attending Attestation: I was present for the entire procedure.

## 2018-11-22 NOTE — Consult Note (Signed)
. Urology Consult   Physician requesting consult: Coralyn Helling, MD  Reason for consult: Foley catheter not draining  History of Present Illness: Justin [name] is a 57 y.o. multiple medical issues listed below.  He is currently being treated for a CHF exacerbation. Urology was consulted to assess the patient after he had a Foley catheter placed earlier today and had minimal urine output afterwards.  The nurse states that she flush the Foley catheter through the irrigation port with approximately 30 mL with no improvement in the urine output through the Foley catheter.  The patient then began to bleeding around the Foley catheter and experiencing bladder spasms.  Patient is currently on BiPAP and denies any discomfort from his catheter, but does that that he has been leaking urine around it.     Past Medical History:  Diagnosis Date  . Congestive heart disease (HCC)   . DVT (deep venous thrombosis) (HCC)   . Hypercholesteremia   . Hypertension   . Renal disorder     History reviewed. No pertinent surgical history.  Current Hospital Medications:  Home Meds:  Current Meds  Medication Sig  . acetaminophen (TYLENOL) 500 MG tablet Take 1,000 mg by mouth every 6 (six) hours as needed (for pain or headaches).  Marland Kitchen albuterol (PROVENTIL HFA;VENTOLIN HFA) 108 (90 BASE) MCG/ACT inhaler Inhale 2 puffs into the lungs every 4 (four) hours. (Patient taking differently: Inhale 2 puffs into the lungs every 4 (four) hours as needed for wheezing or shortness of breath. )  . ammonium lactate (LAC-HYDRIN) 12 % lotion Apply 1 application topically 2 (two) times daily. Apply to bilateral legs for dry skin  . aspirin EC 81 MG tablet Take 81 mg by mouth daily.  . Cholecalciferol 25 MCG (1000 UT) capsule Take 2,000 Units by mouth daily.  . furosemide (LASIX) 40 MG tablet Take 40 mg by mouth as needed (for fluid for a 5lb weight gain in 48 hours).  . furosemide (LASIX) 80 MG tablet Take 80 mg by mouth 2 (two)  times daily.  . metolazone (ZAROXOLYN) 2.5 MG tablet Take 1 tablet (2.5 mg ) every Monday and Friday morning  . Multiple Vitamins-Minerals (ONE-A-DAY MENS 50+ ADVANTAGE) TABS Take 1 tablet by mouth daily.   . potassium chloride SA (K-DUR,KLOR-CON) 20 MEQ tablet Take 20 mEq by mouth daily with breakfast.  . rosuvastatin (CRESTOR) 20 MG tablet Take 20 mg by mouth at bedtime.     Scheduled Meds: . albuterol  2.5 mg Nebulization TID  . aspirin EC  81 mg Oral Daily  . Chlorhexidine Gluconate Cloth  6 each Topical Q0600  . mupirocin ointment  1 application Nasal BID  . rosuvastatin  20 mg Oral QHS  . sodium chloride flush  3 mL Intravenous Q12H   Continuous Infusions: . sodium chloride 10 mL/hr at 11/22/18 1600  . sodium chloride    . heparin Stopped (11/22/18 1101)  . phenylephrine (NEO-SYNEPHRINE) Adult infusion 115 mcg/min (11/22/18 1647)  . piperacillin-tazobactam (ZOSYN)  IV    . [START ON 10/31/2018] vancomycin     PRN Meds:.sodium chloride, Place/Maintain arterial line **AND** sodium chloride, acetaminophen, ondansetron (ZOFRAN) IV, sodium chloride flush  Allergies:  Allergies  Allergen Reactions  . Nsaids Other (See Comments)    Affect kidneys negatively  . Sulfa Antibiotics Diarrhea    Family History  Problem Relation Age of Onset  . Diabetes Mellitus II Neg Hx   . CAD Neg Hx     Social History:  reports  that he has quit smoking. He has never used smokeless tobacco. He reports that he does not drink alcohol or use drugs.  ROS: A complete review of systems was performed.  All systems are negative except for pertinent findings as noted.  Physical Exam:  Vital signs in last 24 hours: Temp:  [97.5 F (36.4 C)-98.7 F (37.1 C)] 97.7 F (36.5 C) (11/25 1534) Pulse Rate:  [83-132] 83 (11/25 1505) Resp:  [20-35] 25 (11/25 1600) BP: (45-131)/(25-89) 87/64 (11/25 1600) SpO2:  [88 %-100 %] 96 % (11/25 1505) FiO2 (%):  [50 %] 50 % (11/25 1505) Weight:  [140.7 kg] 140.7  kg (11/25 0556) Constitutional:  Alert and oriented, No acute distress Cardiovascular: Regular rate and rhythm, No JVD Respiratory: BL exp wheezes.  Currently on BiPAP GI: Abdomen is soft, nontender, nondistended, no abdominal masses GU: No CVA tenderness, 16 F Foley in place with minimal blood around the meatus.  The penis is circumcised and both testicles are descended with no masses or nodules Lymphatic: No lymphadenopathy Neurologic: Grossly intact, no focal deficits Psychiatric: Normal mood and affect  Laboratory Data:  Recent Labs    11/07/2018 1014 11/25/2018 1041 11/22/18 0014  WBC 23.8*  --  24.9*  HGB 11.7* 13.9 11.4*  HCT 39.9 41.0 39.6  PLT 208  --  242    Recent Labs    11/05/2018 1014 11/03/2018 1041 11/22/18 0014  NA 132* 130* 130*  K 4.6 4.6 4.6  CL 91* 93* 90*  GLUCOSE 88 83 68*  BUN 73* 69* 75*  CALCIUM 7.9*  --  7.6*  CREATININE 2.56* 2.80* 3.03*     Results for orders placed or performed during the hospital encounter of 11/22/2018 (from the past 24 hour(s))  Basic metabolic panel     Status: Abnormal   Collection Time: 11/22/18 12:14 AM  Result Value Ref Range   Sodium 130 (L) 135 - 145 mmol/L   Potassium 4.6 3.5 - 5.1 mmol/L   Chloride 90 (L) 98 - 111 mmol/L   CO2 28 22 - 32 mmol/L   Glucose, Bld 68 (L) 70 - 99 mg/dL   BUN 75 (H) 6 - 20 mg/dL   Creatinine, Ser 8.293.03 (H) 0.61 - 1.24 mg/dL   Calcium 7.6 (L) 8.9 - 10.3 mg/dL   GFR calc non Af Amer 21 (L) >60 mL/min   GFR calc Af Amer 24 (L) >60 mL/min   Anion gap 12 5 - 15  Heparin level (unfractionated)     Status: None   Collection Time: 11/22/18 12:14 AM  Result Value Ref Range   Heparin Unfractionated 0.32 0.30 - 0.70 IU/mL  CBC     Status: Abnormal   Collection Time: 11/22/18 12:14 AM  Result Value Ref Range   WBC 24.9 (H) 4.0 - 10.5 K/uL   RBC 6.09 (H) 4.22 - 5.81 MIL/uL   Hemoglobin 11.4 (L) 13.0 - 17.0 g/dL   HCT 56.239.6 13.039.0 - 86.552.0 %   MCV 65.0 (L) 80.0 - 100.0 fL   MCH 18.7 (L) 26.0 - 34.0  pg   MCHC 28.8 (L) 30.0 - 36.0 g/dL   RDW 78.419.9 (H) 69.611.5 - 29.515.5 %   Platelets 242 150 - 400 K/uL   nRBC 0.4 (H) 0.0 - 0.2 %  MRSA PCR Screening     Status: Abnormal   Collection Time: 11/22/18  4:10 AM  Result Value Ref Range   MRSA by PCR POSITIVE (A) NEGATIVE  Glucose, capillary  Status: Abnormal   Collection Time: 11/22/18  7:58 AM  Result Value Ref Range   Glucose-Capillary 40 (LL) 70 - 99 mg/dL  Heparin level (unfractionated)     Status: None   Collection Time: 11/22/18  8:23 AM  Result Value Ref Range   Heparin Unfractionated 0.40 0.30 - 0.70 IU/mL  Glucose, capillary     Status: Abnormal   Collection Time: 11/22/18  8:54 AM  Result Value Ref Range   Glucose-Capillary 143 (H) 70 - 99 mg/dL  Glucose, capillary     Status: Abnormal   Collection Time: 11/22/18 10:19 AM  Result Value Ref Range   Glucose-Capillary 55 (L) 70 - 99 mg/dL  Glucose, capillary     Status: Abnormal   Collection Time: 11/22/18 10:54 AM  Result Value Ref Range   Glucose-Capillary 118 (H) 70 - 99 mg/dL  Procalcitonin - Baseline     Status: None   Collection Time: 11/22/18 10:58 AM  Result Value Ref Range   Procalcitonin 85.25 ng/mL  Lactic acid, plasma     Status: Abnormal   Collection Time: 11/22/18 10:58 AM  Result Value Ref Range   Lactic Acid, Venous 3.5 (HH) 0.5 - 1.9 mmol/L  Glucose, capillary     Status: Abnormal   Collection Time: 11/22/18 11:34 AM  Result Value Ref Range   Glucose-Capillary 106 (H) 70 - 99 mg/dL  I-STAT 3, arterial blood gas (G3+)     Status: Abnormal   Collection Time: 11/22/18 11:52 AM  Result Value Ref Range   pH, Arterial 7.230 (L) 7.350 - 7.450   pCO2 arterial 67.2 (HH) 32.0 - 48.0 mmHg   pO2, Arterial 101.0 83.0 - 108.0 mmHg   Bicarbonate 28.1 (H) 20.0 - 28.0 mmol/L   TCO2 30 22 - 32 mmol/L   O2 Saturation 96.0 %   Acid-base deficit 1.0 0.0 - 2.0 mmol/L   Patient temperature 98.6 F    Collection site RADIAL, ALLEN'S TEST ACCEPTABLE    Drawn by Operator     Sample type ARTERIAL    Comment NOTIFIED PHYSICIAN   Lactate dehydrogenase (pleural or peritoneal fluid)     Status: Abnormal   Collection Time: 11/22/18  2:11 PM  Result Value Ref Range   LD, Fluid 1,416 (H) 3 - 23 U/L   Fluid Type-FLDH Pleural, L   Protein, pleural or peritoneal fluid     Status: None   Collection Time: 11/22/18  2:11 PM  Result Value Ref Range   Total protein, fluid (NOTE) g/dL   Fluid Type-FTP Pleural, L   Glucose, pleural or peritoneal fluid     Status: None   Collection Time: 11/22/18  2:11 PM  Result Value Ref Range   Glucose, Fluid (NOTE) mg/dL   Fluid Type-FGLU Pleural, L   Glucose, capillary     Status: Abnormal   Collection Time: 11/22/18  3:27 PM  Result Value Ref Range   Glucose-Capillary 62 (L) 70 - 99 mg/dL  Glucose, capillary     Status: None   Collection Time: 11/22/18  3:33 PM  Result Value Ref Range   Glucose-Capillary 74 70 - 99 mg/dL   Recent Results (from the past 240 hour(s))  MRSA PCR Screening     Status: Abnormal   Collection Time: 11/22/18  4:10 AM  Result Value Ref Range Status   MRSA by PCR POSITIVE (A) NEGATIVE Final    Comment:        The GeneXpert MRSA Assay (FDA approved for NASAL specimens  only), is one component of a comprehensive MRSA colonization surveillance program. It is not intended to diagnose MRSA infection nor to guide or monitor treatment for MRSA infections. RESULT CALLED TO, READ BACK BY AND VERIFIED WITH: Ulyses Southward RN 9:55 11/22/18 ([email or web address]) Performed at [facility], [address], [place], [place] [address]     Renal Function: Recent Labs    11/19/2018 1014 11/18/2018 1041 11/22/18 0014  CREATININE 2.56* 2.80* 3.03*   Estimated Creatinine Clearance: 38.2 mL/min (A) (by C-G formula based on SCr of 3.03 mg/dL (H)).  Radiologic Imaging: Ct Abdomen Pelvis Wo Contrast  Result Date: 11/27/2018 CLINICAL DATA:  57 year old male with history of chest pain, right-sided flank pain and generalized  weakness since yesterday. EXAM: CT CHEST, ABDOMEN AND PELVIS WITHOUT CONTRAST TECHNIQUE: Multidetector CT imaging of the chest, abdomen and pelvis was performed following the standard protocol without IV contrast. COMPARISON:  CT the chest 04/05/2008. CT the abdomen and pelvis 04/03/2008. FINDINGS: CT CHEST FINDINGS Cardiovascular: Heart size is normal. Pericardial thickening and trace amount of pericardial fluid with pericardial calcification. Narrowing of the cardiac contours, suggesting potential constrictive physiology. There is aortic atherosclerosis, as well as atherosclerosis of the great vessels of the mediastinum and the coronary arteries, including calcified atherosclerotic plaque in the left anterior descending and right coronary arteries. Mediastinum/Nodes: No pathologically enlarged mediastinal or hilar lymph nodes. Please note that accurate exclusion of hilar adenopathy is limited on noncontrast CT scans. Esophagus is unremarkable in appearance. No axillary lymphadenopathy. Lungs/Pleura: Large left pleural effusion with near complete passive atelectasis throughout the left lung. Minimal residual aerated left upper lobe and superior segment of the left lower lobe. Right lung is well aerated. No right-sided pleural effusion. No definite suspicious appearing pulmonary nodules or masses are noted. Musculoskeletal: There are no aggressive appearing lytic or blastic lesions noted in the visualized portions of the skeleton. CT ABDOMEN PELVIS FINDINGS Hepatobiliary: No definite suspicious cystic or solid hepatic lesions are confidently identified on today's noncontrast CT examination. Mild diffuse low attenuation throughout the hepatic parenchyma, indicative of hepatic steatosis. Liver also has a shrunken appearance and nodular contour, indicative of underlying cirrhosis. Gallbladder is poorly demonstrated. Pancreas: No definite pancreatic mass or peripancreatic fluid or inflammatory changes are noted on  today's noncontrast CT examination. Spleen: Unremarkable. Adrenals/Urinary Tract: There are no abnormal calcifications within the collecting system of either kidney, along the course of either ureter, or within the lumen of the urinary bladder. No hydroureteronephrosis or perinephric stranding to suggest urinary tract obstruction at this time. The unenhanced appearance of the kidneys is unremarkable bilaterally. Unenhanced appearance of the urinary bladder is normal. Bilateral adrenal glands are normal in appearance. Stomach/Bowel: Normal appearance of the stomach. No pathologic dilatation of small bowel or colon. A few scattered colonic diverticulae are noted, without definite surrounding inflammatory changes to strongly suggest an acute diverticulitis at this time (assessment is limited by the presence of a small volume of ascites). Normal appendix. Vascular/Lymphatic: No atherosclerotic calcifications noted in the abdominal or pelvic vasculature. Multiple prominent borderline enlarged and mildly enlarged pelvic lymph nodes measuring up 2 1.3 cm in short axis in the right external iliac nodal station, nonspecific and favored to be reactive. Reproductive: Prostate gland and seminal vesicles are unremarkable in appearance. Other: Small volume of ascites. No pneumoperitoneum. Diffuse body wall edema. Musculoskeletal: There are no aggressive appearing lytic or blastic lesions noted in the visualized portions of the skeleton. IMPRESSION: 1. Large left pleural effusion with near complete passive atelectasis  throughout the left lung. 2. Chronic pericardial thickening and calcification with morphologic changes in the heart suggestive of constrictive physiology. Correlation with echocardiography is strongly recommended if not already performed. 3. Morphologic changes in the liver compatible with cirrhosis. There is also evidence of hepatic steatosis. 4. Small volume of ascites. 5. Diffuse body wall edema. 6. Mild colonic  diverticulosis. No definite findings to strongly suggest an acute diverticulitis at this time (assessment is slightly limited by the presence of ascites). 7. Additional incidental findings, as above. Electronically Signed   By: Trudie Reed M.D.   On: 11/27/2018 15:06   Ct Chest Wo Contrast  Result Date: 11/27/18 CLINICAL DATA:  57 year old male with history of chest pain, right-sided flank pain and generalized weakness since yesterday. EXAM: CT CHEST, ABDOMEN AND PELVIS WITHOUT CONTRAST TECHNIQUE: Multidetector CT imaging of the chest, abdomen and pelvis was performed following the standard protocol without IV contrast. COMPARISON:  CT the chest 04/05/2008. CT the abdomen and pelvis 04/03/2008. FINDINGS: CT CHEST FINDINGS Cardiovascular: Heart size is normal. Pericardial thickening and trace amount of pericardial fluid with pericardial calcification. Narrowing of the cardiac contours, suggesting potential constrictive physiology. There is aortic atherosclerosis, as well as atherosclerosis of the great vessels of the mediastinum and the coronary arteries, including calcified atherosclerotic plaque in the left anterior descending and right coronary arteries. Mediastinum/Nodes: No pathologically enlarged mediastinal or hilar lymph nodes. Please note that accurate exclusion of hilar adenopathy is limited on noncontrast CT scans. Esophagus is unremarkable in appearance. No axillary lymphadenopathy. Lungs/Pleura: Large left pleural effusion with near complete passive atelectasis throughout the left lung. Minimal residual aerated left upper lobe and superior segment of the left lower lobe. Right lung is well aerated. No right-sided pleural effusion. No definite suspicious appearing pulmonary nodules or masses are noted. Musculoskeletal: There are no aggressive appearing lytic or blastic lesions noted in the visualized portions of the skeleton. CT ABDOMEN PELVIS FINDINGS Hepatobiliary: No definite suspicious  cystic or solid hepatic lesions are confidently identified on today's noncontrast CT examination. Mild diffuse low attenuation throughout the hepatic parenchyma, indicative of hepatic steatosis. Liver also has a shrunken appearance and nodular contour, indicative of underlying cirrhosis. Gallbladder is poorly demonstrated. Pancreas: No definite pancreatic mass or peripancreatic fluid or inflammatory changes are noted on today's noncontrast CT examination. Spleen: Unremarkable. Adrenals/Urinary Tract: There are no abnormal calcifications within the collecting system of either kidney, along the course of either ureter, or within the lumen of the urinary bladder. No hydroureteronephrosis or perinephric stranding to suggest urinary tract obstruction at this time. The unenhanced appearance of the kidneys is unremarkable bilaterally. Unenhanced appearance of the urinary bladder is normal. Bilateral adrenal glands are normal in appearance. Stomach/Bowel: Normal appearance of the stomach. No pathologic dilatation of small bowel or colon. A few scattered colonic diverticulae are noted, without definite surrounding inflammatory changes to strongly suggest an acute diverticulitis at this time (assessment is limited by the presence of a small volume of ascites). Normal appendix. Vascular/Lymphatic: No atherosclerotic calcifications noted in the abdominal or pelvic vasculature. Multiple prominent borderline enlarged and mildly enlarged pelvic lymph nodes measuring up 2 1.3 cm in short axis in the right external iliac nodal station, nonspecific and favored to be reactive. Reproductive: Prostate gland and seminal vesicles are unremarkable in appearance. Other: Small volume of ascites. No pneumoperitoneum. Diffuse body wall edema. Musculoskeletal: There are no aggressive appearing lytic or blastic lesions noted in the visualized portions of the skeleton. IMPRESSION: 1. Large left pleural effusion with near  complete passive  atelectasis throughout the left lung. 2. Chronic pericardial thickening and calcification with morphologic changes in the heart suggestive of constrictive physiology. Correlation with echocardiography is strongly recommended if not already performed. 3. Morphologic changes in the liver compatible with cirrhosis. There is also evidence of hepatic steatosis. 4. Small volume of ascites. 5. Diffuse body wall edema. 6. Mild colonic diverticulosis. No definite findings to strongly suggest an acute diverticulitis at this time (assessment is slightly limited by the presence of ascites). 7. Additional incidental findings, as above. Electronically Signed   By: Trudie Reed M.D.   On: 12/03/18 15:06   Dg Chest Port 1 View  Result Date: 11/22/2018 CLINICAL DATA:  57 year old male with a history of left-sided thoracentesis EXAM: PORTABLE CHEST 1 VIEW COMPARISON:  CT 12/03/18, chest x-ray 12/03/18 FINDINGS: Cardiomediastinal silhouette likely unchanged though partially obscured by overlying lung/pleural disease. Low lung volumes. Dense opacity of the left chest opacifies the left hemidiaphragm in the left heart border. The a past the is relatively unchanged from the comparison plain film. No pneumothorax observed. Interlobular septal thickening bilateral lungs with patchy opacities of the right lung. IMPRESSION: Similar appearance of left chest opacity, compatible with persisting/recurrent pleural fluid and associated atelectasis/consolidation. Low lung volumes with pulmonary edema. Electronically Signed   By: Gilmer Mor D.O.   On: 11/22/2018 14:54   Dg Chest Port 1 View  Result Date: 12-03-18 CLINICAL DATA:  Chest pain, RIGHT flank pain and generalized weakness since yesterday EXAM: PORTABLE CHEST 1 VIEW COMPARISON:  Portable exam 1024 hours compared to 06/17/2018 FINDINGS: Enlargement of cardiac silhouette. Pulmonary vascular congestion. Large LEFT pleural effusion with significant opacification of the  inferior 2/3 of the LEFT lung. Mild central peribronchial thickening and RIGHT basilar atelectasis. No pneumothorax or acute osseous findings. IMPRESSION: Large LEFT pleural effusion with significant LEFT lung atelectasis; underlying mass and infiltrate not excluded in this setting. Bronchitic changes with minimal RIGHT basilar atelectasis. Enlargement of cardiac silhouette. Electronically Signed   By: Ulyses Southward M.D.   On: 2018-12-03 10:41   Vas [place] Lower Extremity Venous (dvt)  Result Date: 11/22/2018  Lower Venous Study Indications: Swelling, Edema, and Elevated Ddimer.  Limitations: Body habitus and poor ultrasound/tissue interface. Performing Technologist: Chanda Busing RVT  Examination Guidelines: A complete evaluation includes B-mode imaging, spectral Doppler, color Doppler, and power Doppler as needed of all accessible portions of each vessel. Bilateral testing is considered an integral part of a complete examination. Limited examinations for reoccurring indications may be performed as noted.  Right Venous Findings: +---------+---------------+---------+-----------+----------+-------+          CompressibilityPhasicitySpontaneityPropertiesSummary +---------+---------------+---------+-----------+----------+-------+ CFV      Full           Yes      Yes                          +---------+---------------+---------+-----------+----------+-------+ SFJ      Full                                                 +---------+---------------+---------+-----------+----------+-------+ FV Prox  Full                                                 +---------+---------------+---------+-----------+----------+-------+  FV Mid                  Yes      Yes                          +---------+---------------+---------+-----------+----------+-------+ FV DistalFull                                                 +---------+---------------+---------+-----------+----------+-------+ PFV       Full                                                 +---------+---------------+---------+-----------+----------+-------+ POP      Full           Yes      Yes                          +---------+---------------+---------+-----------+----------+-------+ PTV      Full                                                 +---------+---------------+---------+-----------+----------+-------+ PERO     Full                                                 +---------+---------------+---------+-----------+----------+-------+  Left Venous Findings: +---------+---------------+---------+-----------+----------+-------+          CompressibilityPhasicitySpontaneityPropertiesSummary +---------+---------------+---------+-----------+----------+-------+ CFV      Full           Yes      Yes                          +---------+---------------+---------+-----------+----------+-------+ SFJ      Full                                                 +---------+---------------+---------+-----------+----------+-------+ FV Prox  Full                                                 +---------+---------------+---------+-----------+----------+-------+ FV Mid                  Yes      Yes                          +---------+---------------+---------+-----------+----------+-------+ FV Distal               Yes      Yes                          +---------+---------------+---------+-----------+----------+-------+ PFV  Full           Yes      Yes                          +---------+---------------+---------+-----------+----------+-------+ POP      Full                                                 +---------+---------------+---------+-----------+----------+-------+ PTV      Full                                                 +---------+---------------+---------+-----------+----------+-------+    Summary: Right: There is no evidence of deep vein thrombosis in  the lower extremity. However, portions of this examination were limited- see technologist comments above. No cystic structure found in the popliteal fossa. Left: There is no evidence of deep vein thrombosis in the lower extremity. However, portions of this examination were limited- see technologist comments above. No cystic structure found in the popliteal fossa.  *See table(s) above for measurements and observations. Electronically signed by Waverly Ferrari MD on 11/22/2018 at 4:15:58 PM.    Final     I independently reviewed the above imaging studies.  Impression/Recommendation Hematuria following Foley placement CHF exacerbation  -I deflated the balloon of the Foley and advanced the catheter into the patient's bladder with no return of urine or blood.  I was able to hand irrigate the Foley catheter at approximately 120 mL of saline and cleared a small blood clot from the catheter.  Following removal of the blood clot, his catheter drained freely and he ceased leaking around it. -Foley removal per primary team. -Call urology back PRN  Rhoderick Moody, MD [facility] 11/22/2018, 4:48 PM

## 2018-11-22 NOTE — Progress Notes (Signed)
RT and NP attempted to place a radial arterial line without success.  MD notified. RN at bedside.

## 2018-11-22 NOTE — Progress Notes (Addendum)
[facility]                                                                              Patient Demographics  Justin [name], is a 57 y.o. male, DOB - 1961-06-06, NBV:[id number]  Admit date - 11/07/2018   Admitting Physician Costin Karlyne Greenspan, MD  Outpatient Primary MD for the patient is Leighton Ruff, MD  Outpatient specialists:   LOS - 1  days   Medical records reviewed and are as summarized below:    Chief Complaint  Patient presents with  . Chest Pain  . Flank Pain       Brief summary   Patient is a 57 year old male with history of diastolic CHF, prior DVT, not on anti-Coblation, hypertension, hyperlipidemia, CKD stage IV, resident of skilled nursing facility presented with shortness of breath, chest pain, progressive lower extremity swelling.  Patient reported that his right calf was hurting and right leg was more swollen.  In ED, patient was afebrile, normotensive however tachypneic with respiratory rate in 30s, tachycardiac heart rate in 130s, hypoxic requiring 3 L supplemental O2, sodium 130, d-dimer elevated at 2.7, CT scan of the chest showed large left pleural effusion with near complete passive atelectasis throughout the left lung, chronic pericardial thickening and calcification suggesting constrictive physiology.  Patient was admitted for further work-up.   Assessment & Plan    Principal Problem:   Sepsis ([place]) with hypotension -At the time of my examination, somnolent, hypotensive and hypoglycemic.  BP consistently in 60s, checked with Doppler, he has significant evidence of fluid overload with large left-sided pleural effusion with pitting lower extremity edema -Patient met sepsis criteria at the time of admission with tachypnea, tachycardia, lactic acidosis, possible source with underlying respiratory issues.  WBCs 24.9 today, up from 23.8 yesterday -Obtain procalcitonin, lactic acid, blood cultures x2, placed on broad-spectrum antibiotics,  vancomycin (MRSA positive) and Zosyn -Unable to obtain ABG at the bedside, patient will need bedside therapeutic and diagnostic ultrasound, due to sepsis with the persistent hypotension, requested CCM to evaluate patient, may need vasopressors  Active Problems:   Acute respiratory failure with hypoxia ([place]) secondary to acute on chronic diastolic CHF exacerbation, large left-sided pleural effusion with whiteout left lung, constrictive pericarditis -Placed on O2, O2 sats in 90s on 5 L, obtain ABGs -Hold metoprolol secondary to hypotension -Cardiology following, on IV diuresis, strict I's and O's and daily weights -D-dimer elevated, follow VQ scan, currently on heparin drip  Acute on CKD (chronic kidney disease) stage 4 -Baseline creatinine 2.0-2.3 -Creatinine 3.0, trending up likely due to diuresis, also hypotensive    Hypoglycemia -Patient received dextrose, not diabetic.  This morning hypoglycemic at 40, given orange juice, CBG improved to 143, dropped down again to 55.   -Was lethargic and somnolent prior to receiving dextrose    Pleural effusion, left, large with atelectasis of the left lung -Ultrasound-guided thoracentesis have been ordered however currently hemodynamically unstable.   -Will need bedside therapeutic and diagnostic tap    Constrictive pericarditis -Cardiology consulted, follow 2D echo, will follow recommendations.   Code Status: Reviewed the chart, patient has a yellow DNR sheet. Code status  changed to DNR DVT Prophylaxis: Heparin drip Family Communication: Discussed in detail with the patient, all imaging results, lab results explained to the patient    Disposition Plan: Critical care consulted, transfer to ICU  Critical care time Spent in minutes 50 minutes  Procedures:  CT chest  Consultants:   PCCM Cardiology  Antimicrobials:   IV vancomycin 11/25>  IV Zosyn 11/25   Medications  Scheduled Meds: . albuterol  2.5 mg Nebulization TID  .  aspirin EC  81 mg Oral Daily  . Chlorhexidine Gluconate Cloth  6 each Topical Q0600  . furosemide  80 mg Intravenous Q12H  . mupirocin ointment  1 application Nasal BID  . rosuvastatin  20 mg Oral QHS  . sodium chloride flush  3 mL Intravenous Q12H   Continuous Infusions: . sodium chloride    . heparin 1,600 Units/hr (11/22/18 8850)  . piperacillin-tazobactam    . vancomycin     PRN Meds:.sodium chloride, acetaminophen, fentaNYL (SUBLIMAZE) injection, ondansetron (ZOFRAN) IV, sodium chloride flush   Antibiotics   Anti-infectives (From admission, onward)   Start     Dose/Rate Route Frequency Ordered Stop   11/22/18 1200  vancomycin (VANCOCIN) 2,500 mg in sodium chloride 0.9 % 500 mL IVPB     2,500 mg 250 mL/hr over 120 Minutes Intravenous  Once 11/22/18 1109     11/22/18 1115  piperacillin-tazobactam (ZOSYN) IVPB 3.375 g     3.375 g 100 mL/hr over 30 Minutes Intravenous  Once 11/22/18 1109          Subjective:   Justin [name] was seen and examined today.  At the time of my examination, somnolent, lethargic, hypotensive and hypoglycemic.  CBG in 50s.  Mental status improved somewhat after dextrose.  Somewhat short of breath, O2 sats in 90s on 5 L.  States has pain in the right calf, no chest pain. Denies any pain Patient denies dizziness, abdominal pain, N/V/D/C, new weakness, numbess, tingling.  No fevers   Objective:   Vitals:   11/22/18 0617 11/22/18 0620 11/22/18 0726 11/22/18 0940  BP: (!) 89/65 (!) 89/65  (!) 77/65  Pulse: (!) 104 91    Resp: (!) 25   (!) 25  Temp: (!) 97.5 F (36.4 C) (!) 97.5 F (36.4 C)    TempSrc: Oral Oral    SpO2: 98%  100%   Weight:      Height:        Intake/Output Summary (Last 24 hours) at 11/22/2018 1112 Last data filed at 11/22/2018 0700 Gross per 24 hour  Intake 1142.68 ml  Output 150 ml  Net 992.68 ml     Wt Readings from Last 3 Encounters:  11/22/18 (!) 140.7 kg  11/01/18 134.7 kg  06/18/18 (!) 147.1 kg      Exam  General: Somnolent and lethargic  Eyes:  HEENT:    Cardiovascular: S1-S2 clear RRR,  Respiratory: Decreased breath sounds on the left, Rales on the right  Gastrointestinal: Soft, nontender, mildly distended, + bowel sounds  Ext: diffuse anasarca  Neuro: somnolent and lethargic but moving all 4 extremities  Musculoskeletal: No digital cyanosis, clubbing  Skin: No rashes  Psych: somnolent and lethargic, but arousable   Data Reviewed:  I have personally reviewed following labs and imaging studies  Micro Results Recent Results (from the past 240 hour(s))  MRSA PCR Screening     Status: Abnormal   Collection Time: 11/22/18  4:10 AM  Result Value Ref Range Status   MRSA by PCR  POSITIVE (A) NEGATIVE Final    Comment:        The GeneXpert MRSA Assay (FDA approved for NASAL specimens only), is one component of a comprehensive MRSA colonization surveillance program. It is not intended to diagnose MRSA infection nor to guide or monitor treatment for MRSA infections. RESULT CALLED TO, READ BACK BY AND VERIFIED WITH: Joesphine Bare RN 9:55 11/22/18 ([email or web address]) Performed at [facility], [place] [address], [place], [place] [address]     Radiology Reports Ct Abdomen Pelvis Wo Contrast  Result Date: 11/14/2018 CLINICAL DATA:  57 year old male with history of chest pain, right-sided flank pain and generalized weakness since yesterday. EXAM: CT CHEST, ABDOMEN AND PELVIS WITHOUT CONTRAST TECHNIQUE: Multidetector CT imaging of the chest, abdomen and pelvis was performed following the standard protocol without IV contrast. COMPARISON:  CT the chest 04/05/2008. CT the abdomen and pelvis 04/03/2008. FINDINGS: CT CHEST FINDINGS Cardiovascular: Heart size is normal. Pericardial thickening and trace amount of pericardial fluid with pericardial calcification. Narrowing of the cardiac contours, suggesting potential constrictive physiology. There is aortic atherosclerosis, as well  as atherosclerosis of the great vessels of the mediastinum and the coronary arteries, including calcified atherosclerotic plaque in the left anterior descending and right coronary arteries. Mediastinum/Nodes: No pathologically enlarged mediastinal or hilar lymph nodes. Please note that accurate exclusion of hilar adenopathy is limited on noncontrast CT scans. Esophagus is unremarkable in appearance. No axillary lymphadenopathy. Lungs/Pleura: Large left pleural effusion with near complete passive atelectasis throughout the left lung. Minimal residual aerated left upper lobe and superior segment of the left lower lobe. Right lung is well aerated. No right-sided pleural effusion. No definite suspicious appearing pulmonary nodules or masses are noted. Musculoskeletal: There are no aggressive appearing lytic or blastic lesions noted in the visualized portions of the skeleton. CT ABDOMEN PELVIS FINDINGS Hepatobiliary: No definite suspicious cystic or solid hepatic lesions are confidently identified on today's noncontrast CT examination. Mild diffuse low attenuation throughout the hepatic parenchyma, indicative of hepatic steatosis. Liver also has a shrunken appearance and nodular contour, indicative of underlying cirrhosis. Gallbladder is poorly demonstrated. Pancreas: No definite pancreatic mass or peripancreatic fluid or inflammatory changes are noted on today's noncontrast CT examination. Spleen: Unremarkable. Adrenals/Urinary Tract: There are no abnormal calcifications within the collecting system of either kidney, along the course of either ureter, or within the lumen of the urinary bladder. No hydroureteronephrosis or perinephric stranding to suggest urinary tract obstruction at this time. The unenhanced appearance of the kidneys is unremarkable bilaterally. Unenhanced appearance of the urinary bladder is normal. Bilateral adrenal glands are normal in appearance. Stomach/Bowel: Normal appearance of the stomach. No  pathologic dilatation of small bowel or colon. A few scattered colonic diverticulae are noted, without definite surrounding inflammatory changes to strongly suggest an acute diverticulitis at this time (assessment is limited by the presence of a small volume of ascites). Normal appendix. Vascular/Lymphatic: No atherosclerotic calcifications noted in the abdominal or pelvic vasculature. Multiple prominent borderline enlarged and mildly enlarged pelvic lymph nodes measuring up 2 1.3 cm in short axis in the right external iliac nodal station, nonspecific and favored to be reactive. Reproductive: Prostate gland and seminal vesicles are unremarkable in appearance. Other: Small volume of ascites. No pneumoperitoneum. Diffuse body wall edema. Musculoskeletal: There are no aggressive appearing lytic or blastic lesions noted in the visualized portions of the skeleton. IMPRESSION: 1. Large left pleural effusion with near complete passive atelectasis throughout the left lung. 2. Chronic pericardial thickening and calcification with morphologic changes  in the heart suggestive of constrictive physiology. Correlation with echocardiography is strongly recommended if not already performed. 3. Morphologic changes in the liver compatible with cirrhosis. There is also evidence of hepatic steatosis. 4. Small volume of ascites. 5. Diffuse body wall edema. 6. Mild colonic diverticulosis. No definite findings to strongly suggest an acute diverticulitis at this time (assessment is slightly limited by the presence of ascites). 7. Additional incidental findings, as above. Electronically Signed   By: Vinnie Langton M.D.   On: 11/06/2018 15:06   Ct Chest Wo Contrast  Result Date: 11/06/2018 CLINICAL DATA:  57 year old male with history of chest pain, right-sided flank pain and generalized weakness since yesterday. EXAM: CT CHEST, ABDOMEN AND PELVIS WITHOUT CONTRAST TECHNIQUE: Multidetector CT imaging of the chest, abdomen and pelvis was  performed following the standard protocol without IV contrast. COMPARISON:  CT the chest 04/05/2008. CT the abdomen and pelvis 04/03/2008. FINDINGS: CT CHEST FINDINGS Cardiovascular: Heart size is normal. Pericardial thickening and trace amount of pericardial fluid with pericardial calcification. Narrowing of the cardiac contours, suggesting potential constrictive physiology. There is aortic atherosclerosis, as well as atherosclerosis of the great vessels of the mediastinum and the coronary arteries, including calcified atherosclerotic plaque in the left anterior descending and right coronary arteries. Mediastinum/Nodes: No pathologically enlarged mediastinal or hilar lymph nodes. Please note that accurate exclusion of hilar adenopathy is limited on noncontrast CT scans. Esophagus is unremarkable in appearance. No axillary lymphadenopathy. Lungs/Pleura: Large left pleural effusion with near complete passive atelectasis throughout the left lung. Minimal residual aerated left upper lobe and superior segment of the left lower lobe. Right lung is well aerated. No right-sided pleural effusion. No definite suspicious appearing pulmonary nodules or masses are noted. Musculoskeletal: There are no aggressive appearing lytic or blastic lesions noted in the visualized portions of the skeleton. CT ABDOMEN PELVIS FINDINGS Hepatobiliary: No definite suspicious cystic or solid hepatic lesions are confidently identified on today's noncontrast CT examination. Mild diffuse low attenuation throughout the hepatic parenchyma, indicative of hepatic steatosis. Liver also has a shrunken appearance and nodular contour, indicative of underlying cirrhosis. Gallbladder is poorly demonstrated. Pancreas: No definite pancreatic mass or peripancreatic fluid or inflammatory changes are noted on today's noncontrast CT examination. Spleen: Unremarkable. Adrenals/Urinary Tract: There are no abnormal calcifications within the collecting system of  either kidney, along the course of either ureter, or within the lumen of the urinary bladder. No hydroureteronephrosis or perinephric stranding to suggest urinary tract obstruction at this time. The unenhanced appearance of the kidneys is unremarkable bilaterally. Unenhanced appearance of the urinary bladder is normal. Bilateral adrenal glands are normal in appearance. Stomach/Bowel: Normal appearance of the stomach. No pathologic dilatation of small bowel or colon. A few scattered colonic diverticulae are noted, without definite surrounding inflammatory changes to strongly suggest an acute diverticulitis at this time (assessment is limited by the presence of a small volume of ascites). Normal appendix. Vascular/Lymphatic: No atherosclerotic calcifications noted in the abdominal or pelvic vasculature. Multiple prominent borderline enlarged and mildly enlarged pelvic lymph nodes measuring up 2 1.3 cm in short axis in the right external iliac nodal station, nonspecific and favored to be reactive. Reproductive: Prostate gland and seminal vesicles are unremarkable in appearance. Other: Small volume of ascites. No pneumoperitoneum. Diffuse body wall edema. Musculoskeletal: There are no aggressive appearing lytic or blastic lesions noted in the visualized portions of the skeleton. IMPRESSION: 1. Large left pleural effusion with near complete passive atelectasis throughout the left lung. 2. Chronic pericardial thickening and calcification  with morphologic changes in the heart suggestive of constrictive physiology. Correlation with echocardiography is strongly recommended if not already performed. 3. Morphologic changes in the liver compatible with cirrhosis. There is also evidence of hepatic steatosis. 4. Small volume of ascites. 5. Diffuse body wall edema. 6. Mild colonic diverticulosis. No definite findings to strongly suggest an acute diverticulitis at this time (assessment is slightly limited by the presence of  ascites). 7. Additional incidental findings, as above. Electronically Signed   By: Vinnie Langton M.D.   On: 11/18/2018 15:06   Dg Chest Port 1 View  Result Date: 11/24/2018 CLINICAL DATA:  Chest pain, RIGHT flank pain and generalized weakness since yesterday EXAM: PORTABLE CHEST 1 VIEW COMPARISON:  Portable exam 1024 hours compared to 06/17/2018 FINDINGS: Enlargement of cardiac silhouette. Pulmonary vascular congestion. Large LEFT pleural effusion with significant opacification of the inferior 2/3 of the LEFT lung. Mild central peribronchial thickening and RIGHT basilar atelectasis. No pneumothorax or acute osseous findings. IMPRESSION: Large LEFT pleural effusion with significant LEFT lung atelectasis; underlying mass and infiltrate not excluded in this setting. Bronchitic changes with minimal RIGHT basilar atelectasis. Enlargement of cardiac silhouette. Electronically Signed   By: Lavonia Dana M.D.   On: 11/20/2018 10:41    Lab Data:  CBC: Recent Labs  Lab 11/03/2018 1014 11/15/2018 1041 11/22/18 0014  WBC 23.8*  --  24.9*  NEUTROABS 21.4*  --   --   HGB 11.7* 13.9 11.4*  HCT 39.9 41.0 39.6  MCV 65.7*  --  65.0*  PLT 208  --  563   Basic Metabolic Panel: Recent Labs  Lab 11/07/2018 1014 11/05/2018 1041 11/22/18 0014  NA 132* 130* 130*  K 4.6 4.6 4.6  CL 91* 93* 90*  CO2 28  --  28  GLUCOSE 88 83 68*  BUN 73* 69* 75*  CREATININE 2.56* 2.80* 3.03*  CALCIUM 7.9*  --  7.6*   GFR: Estimated Creatinine Clearance: 38.2 mL/min (A) (by C-G formula based on SCr of 3.03 mg/dL (H)). Liver Function Tests: Recent Labs  Lab 11/04/2018 1014  AST 34  ALT 19  ALKPHOS 114  BILITOT 0.9  PROT 5.6*  ALBUMIN 1.7*   No results for input(s): LIPASE, AMYLASE in the last 168 hours. No results for input(s): AMMONIA in the last 168 hours. Coagulation Profile: No results for input(s): INR, PROTIME in the last 168 hours. Cardiac Enzymes: Recent Labs  Lab 11/08/2018 1014  TROPONINI <0.03   BNP  (last 3 results) No results for input(s): PROBNP in the last 8760 hours. HbA1C: No results for input(s): HGBA1C in the last 72 hours. CBG: Recent Labs  Lab 11/22/18 0758 11/22/18 0854 11/22/18 1019 11/22/18 1054  GLUCAP 40* 143* 55* 118*   Lipid Profile: No results for input(s): CHOL, HDL, LDLCALC, TRIG, CHOLHDL, LDLDIRECT in the last 72 hours. Thyroid Function Tests: No results for input(s): TSH, T4TOTAL, FREET4, T3FREE, THYROIDAB in the last 72 hours. Anemia Panel: No results for input(s): VITAMINB12, FOLATE, FERRITIN, TIBC, IRON, RETICCTPCT in the last 72 hours. Urine analysis:    Component Value Date/Time   COLORURINE YELLOW 11/20/2018 1017   APPEARANCEUR CLEAR 11/16/2018 1017   LABSPEC 1.015 11/20/2018 1017   PHURINE 5.0 11/24/2018 1017   GLUCOSEU NEGATIVE 11/04/2018 1017   HGBUR NEGATIVE 11/26/2018 1017   BILIRUBINUR NEGATIVE 11/20/2018 1017   KETONESUR NEGATIVE 11/10/2018 1017   PROTEINUR NEGATIVE 11/06/2018 1017   UROBILINOGEN 0.2 06/10/2014 1535   NITRITE NEGATIVE 11/22/2018 [place] 11/08/2018 1017  Estill Cotta M.D. [facility] 11/22/2018, 11:12 AM  Pager: [phone] Between 7am to 7pm - call Pager - [phone]  After 7pm go to [email or web address] - password [email or web address]  Call night coverage person covering after 7pm

## 2018-11-22 NOTE — Progress Notes (Signed)
ANTICOAGULATION CONSULT NOTE   Pharmacy Consult for Heparin Indication: atrial fibrillation/rule-out DVT  Allergies  Allergen Reactions  . Nsaids Other (See Comments)    Affect kidneys negatively  . Sulfa Antibiotics Diarrhea    Patient Measurements: Height: 6' (182.9 cm) Weight: (!) 305 lb 8 oz (138.6 kg) IBW/kg (Calculated) : 77.6 Heparin Dosing Weight: 109.5 kg  Vital Signs: Temp: 98.7 F (37.1 C) (11/25 0011) Temp Source: Oral (11/25 0011) BP: 110/78 (11/25 0011) Pulse Rate: 104 (11/25 0011)  Labs: Recent Labs    11/09/2018 1014 11/03/2018 1041 11/22/18 0014  HGB 11.7* 13.9 11.4*  HCT 39.9 41.0 39.6  PLT 208  --  242  HEPARINUNFRC  --   --  0.32  CREATININE 2.56* 2.80* 3.03*  TROPONINI <0.03  --   --     Estimated Creatinine Clearance: 37.9 mL/min (A) (by C-G formula based on SCr of 3.03 mg/dL (H)).   Medical History: Past Medical History:  Diagnosis Date  . Congestive heart disease (HCC)   . DVT (deep venous thrombosis) (HCC)   . Hypercholesteremia   . Hypertension   . Renal disorder     Medications:  Scheduled:  . albuterol  2.5 mg Nebulization Q4H  . aspirin EC  81 mg Oral Daily  . furosemide  80 mg Intravenous Q12H  . metoprolol tartrate  12.5 mg Oral BID  . rosuvastatin  20 mg Oral QHS  . sodium chloride flush  3 mL Intravenous Q12H    Assessment: 57 yom from nursing home complaining of SOB, CP, and progressive LE swelling - not on anticoag PTA. EKG showing atrial tachycardia vs Afib.  Hgb 13.9, plt 208. D-dimer 2.78. Scr 2.8 (CrCl 41 mL/min). No s/sx of bleeding.   11/25 AM update: heparin level on low end of therapeutic range  Goal of Therapy:  Heparin level 0.3-0.7 units/ml Monitor platelets by anticoagulation protocol: Yes   Plan:  Will increase heparin slightly to 1600 units/hr to help prevent sub-therapeutic level Re-check heparin level at 0900 F/U DVT work-up  Abran DukeJames Brecken Walth, PharmD, BCPS Clinical Pharmacist Phone:  [phone]

## 2018-11-22 NOTE — Progress Notes (Signed)
Hypoglycemic Event  CBG: 62  Treatment: D50 IV 25 mL  Symptoms: None  Follow-up CBG: Time:2023 CBG Result:81  Possible Reasons for Event: Inadequate meal intake  Comments/MD notified:Brooke, NP    Della Gooummings, Jaylon Boylen R

## 2018-11-22 NOTE — Consult Note (Signed)
NAME:  Justin [name], MRN:  [id number], DOB:  February 12, 1961, LOS: 1 ADMISSION DATE:  December 13, 2018, CONSULTATION DATE:  11/22/18 REFERRING MD:  Isidoro Donning  CHIEF COMPLAINT:  Hypotension   Brief History   Justin [name] is a 57 y.o. male who was admitted 11/24 with dyspnea due to large left pleural effusion vs empyema.  11/25 had AMS due to hypoglycemia and hypotension.  Transferred to ICU for further workup and to facilitate bedside thoracentesis. Being empirically covered with vanc / zosyn given WBC 25 (? Empyema).  History of present illness   Justin [name] is a 57 y.o. male who has a PMH as outlined below (see "past medical history") and who resides at [facility].  He was admitted to [facility] 11/24 with SOB, chest discomfort, anasarca, RLE edema > LLE with RLE pain.  In ED, he was found to be tachypneic, tachycardic, hypoxic.  He had CT of the chest that demonstrated a large left pleural effusion with compressive atelectasis along with chronic pericardial thickening.  He was admitted by [facility] for AoC dCHF, acute hypoxic respiratory failure, AoCKD, elevated D-dimer.  He was started on empiric heparin for possible RLE DVT.  On morning of 11/25, he was noted to have AMS and was found to be hypoglycemic with CBG in 50's.  He received dextrose and had improvement in mental status; however, was also found to have hypotension with SBP in low 60s.  WBC was noted to be 25 for which he was started on empiric vanc / zosyn.  PCCM was called for transfer to ICU for diagnostic and therapeutic thoracentesis and additional workup / management.  Of note, pt is listed as full DNR.   Past Medical History  CKD IV, HTN, HLD, RLE DVT no longer on anticoagulation, dCHF.  Significant Hospital Events   11/24 > admit. 11/25 > transfer to ICU.  Consults:  PCCM.  Procedures:  LE duplex 11/25 >  Left thoracentesis planned 11/25 >  R/LHC planned (unknown date) >  Significant Diagnostic Tests:  CXR 11/24 > large  left pleural effusion. CT chest / A / P 11/24 > large left pleural effusion with compressive atelectasis, chronic pericardial thickening with findings suggestive of constrictive physiology, morphologic changes in liver compatible with cirrhosis with evidence of hepatic steatosis, small volume ascites, diffuse body wall edema.   Micro Data:  Blood 11/25 >   Antimicrobials:  Vanc 11/25 >  Zosyn 11/25 >    Interim history/subjective:  Awake, feels dyspnea is somewhat improved.  Manual SBP in low 60s.  Objective:  Blood pressure (!) 77/65, pulse 91, temperature (!) 97.5 F (36.4 C), temperature source Oral, resp. rate (!) 25, height 6' (1.829 m), weight (!) 140.7 kg, SpO2 100 %.        Intake/Output Summary (Last 24 hours) at 11/22/2018 1107 Last data filed at 11/22/2018 0700 Gross per 24 hour  Intake 1142.68 ml  Output 150 ml  Net 992.68 ml   Filed Weights   2018/12/13 1011 11/22/18 0556  Weight: (!) 138.6 kg (!) 140.7 kg    Examination: General: Adult male, chronically ill appearing. Neuro: Awake but somewhat confused.  No deficits. HEENT: [place]/AT. Sclerae anicteric.  EOMI. Cardiovascular: RRR, no M/R/G.  Lungs: Respirations even and unlabored.  Lung sounds absent on left.  Faint basilar crackles on right. Abdomen: Obese.  BS hypoactive, abdomen distended and taught. Musculoskeletal: No gross deformities, 2+ edema.  Skin: Intact, warm, no rashes.  Assessment & Plan:   Large left  pleural effusion vs empyema. - Will plan for diagnostic and therapeutic thoracentesis. - Might need chest tube depending on what thora reveals. - Heparin held, can re-start after procedure.  Acute hypoxic respiratory failure - due to above +/- HCAP. - Continue supplemental O2 as needed to maintain SpO2 > 92%. - BiPAP PRN for increased work of breathing. - Continue empiric abx (vanc / zosyn) and follow cultures. - F/u on thora analysis.  Hypotension - ? Cardiogenic (CHF vs constrictive  pericarditis, see below) vs septic (given leukocytosis) vs possible obstructive (due to ? PE - has hx RLE DVT and not currently on anticoagulation).  DNR noted. - Transfer to ICU. - 500cc NS bolus and 25g Albumin now. - Continue empiric abx. - F/u on echo, VQ scan when stable (can continue empiric heparin after thora is completed).  dCHF (BNP not impressive, echo from June 2019 unable to evaluate for LV diastolic function) vs ? cardiorenal syndrome. - F/u on echo.  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 - F/u on echo. - Cardiology following, recommending R/LHC once stabilized.  Hypervolemic hyponatremia. AoCKD - oliguric despite 80mg  lasix q12hrs. - Lasix held due to hypotension. - Follow BMP, I/O's. -  Might need nephrology input.  Hypocalcemia. - 1g Ca gluconate.  Anasarca. - 25g albumin now.  ? RLE DVT - has hx of this (not on anticoagulation) and presented with RLE edema > LLE and RLE pain. ? PE. - F/u on LE duplex, echo, VQ scan.  Hypoglycemia. - Follow CBG hourly for now. - Might need D5.   Best Practice:  Diet: Renal. Pain/Anxiety/Delirium protocol (if indicated): N/A. VAP protocol (if indicated): N/A. DVT prophylaxis: Heparin gtt (on hold for thoracentesis). GI prophylaxis: N/A. Glucose control: N/A. Mobility: Bedrest for now. Code Status: DNR. Family Communication: Sister updated at bedside. Disposition: ICU.  Labs   CBC: Recent Labs  Lab 11-25-18 1014 11-25-18 1041 11/22/18 0014  WBC 23.8*  --  24.9*  NEUTROABS 21.4*  --   --   HGB 11.7* 13.9 11.4*  HCT 39.9 41.0 39.6  MCV 65.7*  --  65.0*  PLT 208  --  242   Basic Metabolic Panel: Recent Labs  Lab 25-Nov-2018 1014 11/25/18 1041 11/22/18 0014  NA 132* 130* 130*  K 4.6 4.6 4.6  CL 91* 93* 90*  CO2 28  --  28  GLUCOSE 88 83 68*  BUN 73* 69* 75*  CREATININE 2.56* 2.80* 3.03*  CALCIUM 7.9*  --  7.6*   GFR: Estimated Creatinine Clearance: 38.2 mL/min (A) (by C-G  formula based on SCr of 3.03 mg/dL (H)). Recent Labs  Lab 2018-11-25 1014 11-25-2018 1039 11/22/18 0014  WBC 23.8*  --  24.9*  LATICACIDVEN  --  2.13*  --    Liver Function Tests: Recent Labs  Lab 11-25-2018 1014  AST 34  ALT 19  ALKPHOS 114  BILITOT 0.9  PROT 5.6*  ALBUMIN 1.7*   No results for input(s): LIPASE, AMYLASE in the last 168 hours. No results for input(s): AMMONIA in the last 168 hours. ABG    Component Value Date/Time   TCO2 35 (H) Nov 25, 2018 1041    Coagulation Profile: No results for input(s): INR, PROTIME in the last 168 hours. Cardiac Enzymes: Recent Labs  Lab 2018/11/25 1014  TROPONINI <0.03   HbA1C: No results found for: HGBA1C CBG: Recent Labs  Lab 11/22/18 0758 11/22/18 0854 11/22/18 1019 11/22/18 1054  GLUCAP 40* 143* 55* 118*    Review of Systems:  All negative; except for those that are bolded, which indicate positives.  Constitutional: weight loss, weight gain, night sweats, fevers, chills, fatigue, weakness.  HEENT: headaches, sore throat, sneezing, nasal congestion, post nasal drip, difficulty swallowing, tooth/dental problems, visual complaints, visual changes, ear aches. Neuro: difficulty with speech, weakness, numbness, ataxia. CV:  chest pain, orthopnea, PND, swelling in lower extremities, dizziness, palpitations, syncope.  Resp: cough, hemoptysis, dyspnea, wheezing. GI: heartburn, indigestion, abdominal pain, nausea, vomiting, diarrhea, constipation, change in bowel habits, loss of appetite, hematemesis, melena, hematochezia.  GU: dysuria, change in color of urine, urgency or frequency, flank pain, hematuria. MSK: joint pain or swelling, decreased range of motion. Psych: change in mood or affect, depression, anxiety, suicidal ideations, homicidal ideations. Skin: rash, itching, bruising.  Past medical history  He,  has a past medical history of Congestive heart disease (HCC), DVT (deep venous thrombosis) (HCC), Hypercholesteremia,  Hypertension, and Renal disorder.   Surgical History   History reviewed. No pertinent surgical history.   Social History   reports that he has quit smoking. He has never used smokeless tobacco. He reports that he does not drink alcohol or use drugs.   Family history   His family history is negative for Diabetes Mellitus II and CAD.   Allergies Allergies  Allergen Reactions  . Nsaids Other (See Comments)    Affect kidneys negatively  . Sulfa Antibiotics Diarrhea     Home meds  Prior to Admission medications   Medication Sig Start Date End Date Taking? Authorizing Provider  acetaminophen (TYLENOL) 500 MG tablet Take 1,000 mg by mouth every 6 (six) hours as needed (for pain or headaches).   Yes [provider]  albuterol (PROVENTIL HFA;VENTOLIN HFA) 108 (90 BASE) MCG/ACT inhaler Inhale 2 puffs into the lungs every 4 (four) hours. Patient taking differently: Inhale 2 puffs into the lungs every 4 (four) hours as needed for wheezing or shortness of breath.  11/08/13  Yes Elenora GammaBradshaw, Samuel L, MD  ammonium lactate (LAC-HYDRIN) 12 % lotion Apply 1 application topically 2 (two) times daily. Apply to bilateral legs for dry skin   Yes [provider]  aspirin EC 81 MG tablet Take 81 mg by mouth daily.   Yes [provider]  Cholecalciferol 25 MCG (1000 UT) capsule Take 2,000 Units by mouth daily.   Yes [provider]  furosemide (LASIX) 40 MG tablet Take 40 mg by mouth as needed (for fluid for a 5lb weight gain in 48 hours).   Yes [provider]  furosemide (LASIX) 80 MG tablet Take 80 mg by mouth 2 (two) times daily.   Yes [provider]  metolazone (ZAROXOLYN) 2.5 MG tablet Take 1 tablet (2.5 mg ) every Monday and Friday morning 06/18/18  Yes Emokpae, Courage, MD  Multiple Vitamins-Minerals (ONE-A-DAY MENS 50+ ADVANTAGE) TABS Take 1 tablet by mouth daily.    Yes [provider]  potassium chloride SA (K-DUR,KLOR-CON) 20 MEQ tablet  Take 20 mEq by mouth daily with breakfast.   Yes [provider]  rosuvastatin (CRESTOR) 20 MG tablet Take 20 mg by mouth at bedtime.    Yes [provider]     Rutherford Guysahul Marshaun Lortie, PA - [facility] [place] [facility] Pager: [phone]  or [phone] 11/22/2018, 11:07 AM

## 2018-11-22 NOTE — Progress Notes (Signed)
RT NOTES: ABG ordered. Multiple attempts, unable to obtain. Patient being moved to ICU. Will attempt in ICU.

## 2018-11-22 NOTE — Progress Notes (Signed)
PCCM Interval Note   Called in reference to ongoing hypotension with SBP in 60-70's, despite being at maximum peripheral Neo at 200 mcg/min.  Additionally patient continues to have ongoing hypoglycemia. We will start D10 gtt at 20 ml/hr at this time. CXR reviewed with good placement of CT, ongoing pulm edema.  Additional fluids may precipitate further respiratory compromise.  He continues to have oliguria.  As patient is drowsy and confused.  Called and spoke at length to patient's sister Ivor MessierCora concerning his ongoing shock.  We discussed placing a central line to add additional pressors.  At this time, she does not consent to central line placement.  I am hesitant that patient could tolerate lying flat for procedure.  He has refused his BiPAP thus far, but does not seem in respiratory distress currently.  Patient is a DNR/ DNI.   I am concerned that despite all aggressive measures thus far, patient continues to decline and is at high risk for further decompensation tonight.  Patient's sister is coming back to hospital.  If BP continues to decline, may need to consider transitioning to comfort care.     Posey BoyerBrooke , AGACNP-BC [place] [facility] Pgr: [phone] or if no answer [phone] 11/22/2018, 8:24 PM

## 2018-11-22 NOTE — Procedures (Signed)
Chest Tube Insertion Procedure Note  Indications:  Clinically significant Empyema  Pre-operative Diagnosis: Empyema  Post-operative Diagnosis: Empyema  Procedure Details  Informed consent was obtained for the procedure, including sedation.  Risks of lung perforation, hemorrhage, arrhythmia, and adverse drug reaction were discussed.   After sterile skin prep, using standard technique, a 20 French tube was placed in the left lateral 6th rib space.  Findings: Significant amount of milky / turbid fluid was expelled from the pleural space.  Estimated Blood Loss:  Minimal         Specimens:  None              Complications:  None; patient tolerated the procedure well.         Disposition: ICU - extubated and stable.         Condition: stable   Rutherford Guysahul , [place]PA - [place] [facility] Pager: [phone]  or [phone] 11/22/2018, 7:00 PM   Attending Attestation: I was present for the entire procedure.

## 2018-11-22 NOTE — Significant Event (Signed)
Rapid Response Event Note  Overview:  Called to assist with patient with hypotension and decreased LOC Time Called: 1010 Arrival Time: 1013 Event Type: Hypotension, Respiratory  Initial Focused Assessment:  On arrival patient sitting upright in bed - arouses to loud name calling and stimulation - opens eyes mumbles but falls back to sleep - skin cold and dry - moves all extremities to pain - resps shallow - regular -abdomen very large distended and very tight possibly restricting resps. Bil BS present few fine crackles right side - left side with minimal air movement in upper lobe only- decreased LLL.  O2 at 3 liters nasal cannula - O2 sats difficult to see secondary to cool extremities.  RR 26.  Automatic BP reading 63/27 Afib on monitor rate 87-98.  RN Corrie DandyMary at bedside - reports hypoglycemia this AM.  4+ pitting edema abdomen and legs bilaterally.     Interventions: Stat repeat CBG 55 - treated with one amp D50W - more awake after treatment but still sleepy. Dr. Isidoro Donningai to bedside.   Will answer questions - complains only of pain in calf.  States his breathing is better.   Stat call for ABG.  BP inconsistent on monitor - manual checked both arms - 58 to 62 systolic with doppler - checked 3 times. Labs including blood cultures drawn. Repeat CBG 118. Second IV started [facility] right hand.  Rutherford Guysahul Desai PA and Dr. Craige CottaSood with PCCM to bedside.  Patient remains sleepy but arousable.  Code status clarified.  NS bolus 500 cc started per order.  BP remains 62/systolic with doppler. Remains arousable to name but goes right back to sleep.  Report called to 26M by Jesse SansMary RN - transported to 26M09 without incident.  Rectal temp on arrival  98.6 F.  Handoff to [employer] RN.   Plan of Care (if not transferred):  Event Summary: Name of Physician Notified: Dr. Isidoro Donningai at (pta RRT)  Name of Consulting Physician Notified: Dr. Craige CottaSood at (by Dr. Isidoro Donningai)  Outcome: Transferred (Comment), Code status clarified     Delton PrairieBritt, Reneshia Zuccaro L

## 2018-11-22 NOTE — Progress Notes (Signed)
Progress Note  Patient Name: Justin [name] Date of Encounter: 11/22/2018  Primary Cardiologist:   No primary care provider on file.   Subjective   Awake and says "I am breathing better."  No acute distress or pain.  Has to be up at 45 degrees in bed.   Inpatient Medications    Scheduled Meds: . albuterol  2.5 mg Nebulization Q4H  . aspirin EC  81 mg Oral Daily  . furosemide  80 mg Intravenous Q12H  . metoprolol tartrate  12.5 mg Oral BID  . rosuvastatin  20 mg Oral QHS  . sodium chloride flush  3 mL Intravenous Q12H   Continuous Infusions: . sodium chloride    . heparin 1,600 Units/hr (11/22/18 0653)   PRN Meds: sodium chloride, acetaminophen, HYDROcodone-acetaminophen, ondansetron (ZOFRAN) IV, sodium chloride flush   Vital Signs    Vitals:   11/22/18 0556 11/22/18 0617 11/22/18 0620 11/22/18 0726  BP:  (!) 89/65 (!) 89/65   Pulse:  (!) 104 91   Resp:  (!) 25    Temp:  (!) 97.5 F (36.4 C) (!) 97.5 F (36.4 C)   TempSrc:  Oral Oral   SpO2:  98%  100%  Weight: (!) 140.7 kg     Height:        Intake/Output Summary (Last 24 hours) at 11/22/2018 0914 Last data filed at 11/22/2018 0700 Gross per 24 hour  Intake 1142.68 ml  Output 150 ml  Net 992.68 ml   Filed Weights   11/09/2018 1011 11/22/18 0556  Weight: (!) 138.6 kg (!) 140.7 kg    Telemetry    Sinus tachycardia - Personally Reviewed  ECG    NA - Personally Reviewed  Physical Exam   GEN: No acute distress.   Neck: No  JVD Cardiac: Irregular RR, no murmurs, rubs, or gallops.  Respiratory:   Absent breath sounds on the left and decreased on the right.  GI: Soft, nontender, non-distended  MS:   Edema diffusely, anasarca. Neuro:  Nonfocal  Psych: Normal affect .  Labs    Chemistry Recent Labs  Lab 11/03/2018 1014 11/24/2018 1041 11/22/18 0014  NA 132* 130* 130*  K 4.6 4.6 4.6  CL 91* 93* 90*  CO2 28  --  28  GLUCOSE 88 83 68*  BUN 73* 69* 75*  CREATININE 2.56* 2.80* 3.03*    CALCIUM 7.9*  --  7.6*  PROT 5.6*  --   --   ALBUMIN 1.7*  --   --   AST 34  --   --   ALT 19  --   --   ALKPHOS 114  --   --   BILITOT 0.9  --   --   GFRNONAA 26*  --  21*  GFRAA 30*  --  24*  ANIONGAP 13  --  12     Hematology Recent Labs  Lab 11/18/2018 1014 11/04/2018 1041 11/22/18 0014  WBC 23.8*  --  24.9*  RBC 6.07*  --  6.09*  HGB 11.7* 13.9 11.4*  HCT 39.9 41.0 39.6  MCV 65.7*  --  65.0*  MCH 19.3*  --  18.7*  MCHC 29.3*  --  28.8*  RDW 20.0*  --  19.9*  PLT 208  --  242    Cardiac Enzymes Recent Labs  Lab 11/09/2018 1014  TROPONINI <0.03   No results for input(s): TROPIPOC in the last 168 hours.   BNP Recent Labs  Lab 11/24/2018 1017  BNP  236.1*     DDimer  Recent Labs  Lab 10/30/2018 1024  DDIMER 2.78*     Radiology    Ct Abdomen Pelvis Wo Contrast  Result Date: 11/13/2018 CLINICAL DATA:  57 year old male with history of chest pain, right-sided flank pain and generalized weakness since yesterday. EXAM: CT CHEST, ABDOMEN AND PELVIS WITHOUT CONTRAST TECHNIQUE: Multidetector CT imaging of the chest, abdomen and pelvis was performed following the standard protocol without IV contrast. COMPARISON:  CT the chest 04/05/2008. CT the abdomen and pelvis 04/03/2008. FINDINGS: CT CHEST FINDINGS Cardiovascular: Heart size is normal. Pericardial thickening and trace amount of pericardial fluid with pericardial calcification. Narrowing of the cardiac contours, suggesting potential constrictive physiology. There is aortic atherosclerosis, as well as atherosclerosis of the great vessels of the mediastinum and the coronary arteries, including calcified atherosclerotic plaque in the left anterior descending and right coronary arteries. Mediastinum/Nodes: No pathologically enlarged mediastinal or hilar lymph nodes. Please note that accurate exclusion of hilar adenopathy is limited on noncontrast CT scans. Esophagus is unremarkable in appearance. No axillary lymphadenopathy.  Lungs/Pleura: Large left pleural effusion with near complete passive atelectasis throughout the left lung. Minimal residual aerated left upper lobe and superior segment of the left lower lobe. Right lung is well aerated. No right-sided pleural effusion. No definite suspicious appearing pulmonary nodules or masses are noted. Musculoskeletal: There are no aggressive appearing lytic or blastic lesions noted in the visualized portions of the skeleton. CT ABDOMEN PELVIS FINDINGS Hepatobiliary: No definite suspicious cystic or solid hepatic lesions are confidently identified on today's noncontrast CT examination. Mild diffuse low attenuation throughout the hepatic parenchyma, indicative of hepatic steatosis. Liver also has a shrunken appearance and nodular contour, indicative of underlying cirrhosis. Gallbladder is poorly demonstrated. Pancreas: No definite pancreatic mass or peripancreatic fluid or inflammatory changes are noted on today's noncontrast CT examination. Spleen: Unremarkable. Adrenals/Urinary Tract: There are no abnormal calcifications within the collecting system of either kidney, along the course of either ureter, or within the lumen of the urinary bladder. No hydroureteronephrosis or perinephric stranding to suggest urinary tract obstruction at this time. The unenhanced appearance of the kidneys is unremarkable bilaterally. Unenhanced appearance of the urinary bladder is normal. Bilateral adrenal glands are normal in appearance. Stomach/Bowel: Normal appearance of the stomach. No pathologic dilatation of small bowel or colon. A few scattered colonic diverticulae are noted, without definite surrounding inflammatory changes to strongly suggest an acute diverticulitis at this time (assessment is limited by the presence of a small volume of ascites). Normal appendix. Vascular/Lymphatic: No atherosclerotic calcifications noted in the abdominal or pelvic vasculature. Multiple prominent borderline enlarged and  mildly enlarged pelvic lymph nodes measuring up 2 1.3 cm in short axis in the right external iliac nodal station, nonspecific and favored to be reactive. Reproductive: Prostate gland and seminal vesicles are unremarkable in appearance. Other: Small volume of ascites. No pneumoperitoneum. Diffuse body wall edema. Musculoskeletal: There are no aggressive appearing lytic or blastic lesions noted in the visualized portions of the skeleton. IMPRESSION: 1. Large left pleural effusion with near complete passive atelectasis throughout the left lung. 2. Chronic pericardial thickening and calcification with morphologic changes in the heart suggestive of constrictive physiology. Correlation with echocardiography is strongly recommended if not already performed. 3. Morphologic changes in the liver compatible with cirrhosis. There is also evidence of hepatic steatosis. 4. Small volume of ascites. 5. Diffuse body wall edema. 6. Mild colonic diverticulosis. No definite findings to strongly suggest an acute diverticulitis at this time (assessment is slightly  limited by the presence of ascites). 7. Additional incidental findings, as above. Electronically Signed   By: Trudie Reed M.D.   On: 11/05/2018 15:06   Ct Chest Wo Contrast  Result Date: 11/25/2018 CLINICAL DATA:  57 year old male with history of chest pain, right-sided flank pain and generalized weakness since yesterday. EXAM: CT CHEST, ABDOMEN AND PELVIS WITHOUT CONTRAST TECHNIQUE: Multidetector CT imaging of the chest, abdomen and pelvis was performed following the standard protocol without IV contrast. COMPARISON:  CT the chest 04/05/2008. CT the abdomen and pelvis 04/03/2008. FINDINGS: CT CHEST FINDINGS Cardiovascular: Heart size is normal. Pericardial thickening and trace amount of pericardial fluid with pericardial calcification. Narrowing of the cardiac contours, suggesting potential constrictive physiology. There is aortic atherosclerosis, as well as  atherosclerosis of the great vessels of the mediastinum and the coronary arteries, including calcified atherosclerotic plaque in the left anterior descending and right coronary arteries. Mediastinum/Nodes: No pathologically enlarged mediastinal or hilar lymph nodes. Please note that accurate exclusion of hilar adenopathy is limited on noncontrast CT scans. Esophagus is unremarkable in appearance. No axillary lymphadenopathy. Lungs/Pleura: Large left pleural effusion with near complete passive atelectasis throughout the left lung. Minimal residual aerated left upper lobe and superior segment of the left lower lobe. Right lung is well aerated. No right-sided pleural effusion. No definite suspicious appearing pulmonary nodules or masses are noted. Musculoskeletal: There are no aggressive appearing lytic or blastic lesions noted in the visualized portions of the skeleton. CT ABDOMEN PELVIS FINDINGS Hepatobiliary: No definite suspicious cystic or solid hepatic lesions are confidently identified on today's noncontrast CT examination. Mild diffuse low attenuation throughout the hepatic parenchyma, indicative of hepatic steatosis. Liver also has a shrunken appearance and nodular contour, indicative of underlying cirrhosis. Gallbladder is poorly demonstrated. Pancreas: No definite pancreatic mass or peripancreatic fluid or inflammatory changes are noted on today's noncontrast CT examination. Spleen: Unremarkable. Adrenals/Urinary Tract: There are no abnormal calcifications within the collecting system of either kidney, along the course of either ureter, or within the lumen of the urinary bladder. No hydroureteronephrosis or perinephric stranding to suggest urinary tract obstruction at this time. The unenhanced appearance of the kidneys is unremarkable bilaterally. Unenhanced appearance of the urinary bladder is normal. Bilateral adrenal glands are normal in appearance. Stomach/Bowel: Normal appearance of the stomach. No  pathologic dilatation of small bowel or colon. A few scattered colonic diverticulae are noted, without definite surrounding inflammatory changes to strongly suggest an acute diverticulitis at this time (assessment is limited by the presence of a small volume of ascites). Normal appendix. Vascular/Lymphatic: No atherosclerotic calcifications noted in the abdominal or pelvic vasculature. Multiple prominent borderline enlarged and mildly enlarged pelvic lymph nodes measuring up 2 1.3 cm in short axis in the right external iliac nodal station, nonspecific and favored to be reactive. Reproductive: Prostate gland and seminal vesicles are unremarkable in appearance. Other: Small volume of ascites. No pneumoperitoneum. Diffuse body wall edema. Musculoskeletal: There are no aggressive appearing lytic or blastic lesions noted in the visualized portions of the skeleton. IMPRESSION: 1. Large left pleural effusion with near complete passive atelectasis throughout the left lung. 2. Chronic pericardial thickening and calcification with morphologic changes in the heart suggestive of constrictive physiology. Correlation with echocardiography is strongly recommended if not already performed. 3. Morphologic changes in the liver compatible with cirrhosis. There is also evidence of hepatic steatosis. 4. Small volume of ascites. 5. Diffuse body wall edema. 6. Mild colonic diverticulosis. No definite findings to strongly suggest an acute diverticulitis at this time (  assessment is slightly limited by the presence of ascites). 7. Additional incidental findings, as above. Electronically Signed   By: Trudie Reed M.D.   On: November 25, 2018 15:06   Dg Chest Port 1 View  Result Date: 11/25/18 CLINICAL DATA:  Chest pain, RIGHT flank pain and generalized weakness since yesterday EXAM: PORTABLE CHEST 1 VIEW COMPARISON:  Portable exam 1024 hours compared to 06/17/2018 FINDINGS: Enlargement of cardiac silhouette. Pulmonary vascular congestion.  Large LEFT pleural effusion with significant opacification of the inferior 2/3 of the LEFT lung. Mild central peribronchial thickening and RIGHT basilar atelectasis. No pneumothorax or acute osseous findings. IMPRESSION: Large LEFT pleural effusion with significant LEFT lung atelectasis; underlying mass and infiltrate not excluded in this setting. Bronchitic changes with minimal RIGHT basilar atelectasis. Enlargement of cardiac silhouette. Electronically Signed   By: Ulyses Southward M.D.   On: 2018/11/25 10:41    Cardiac Studies   Echo pending  Patient Profile     57 y.o. male with a hx of chronic diastolic heart failure who is being seen for the evaluation of acute on chronic diastolic heart failure at the request of Dr. Elvera Lennox.  Assessment & Plan    ACUTE ON CHRONIC DIASTOLIC HF:  See below.    Intake and output is not complete.  Need to exclude constrictive pericarditis.    LEFT PLEURAL EFFUSION:    Thoracentesis is ordered.     TACHYCARDIA:  Secondary     ACUTE ON CHRONIC RENAL INSUFFICIENCY:   Renal consult is suggested.      CHRONIC PERICARDIAL THICKENING:    Echo is pending.  will consider further evaluation pending this.  Pericardium is very thick on CT.  I reviewed the CT and he has had thickened pericardium since 2009 at least.  No CTs since then.  We are going to need to exclude constrictive pericarditis this admission.  He will need right and left cath for pressures.  OK to proceed with thoracentesis first for comfort.  He will not be able to lie flat for this procedure.    For questions or updates, please contact [facility] Please consult [email or web address] for contact info under Cardiology/STEMI.   Signed, Rollene Rotunda, MD  11/22/2018, 9:14 AM

## 2018-11-22 NOTE — Progress Notes (Signed)
Patient is refusing to wear BIPAP at this time. Patient in no distress at this time.

## 2018-11-22 NOTE — Progress Notes (Addendum)
[phone] Dr. Isidoro Donningai called regarding pt's low BP and increased lethargy. Awaiting call back.  1015 Dr. Isidoro Donningai paged 2nd time, MD returned called and informed RN to call rapid response.   1020 Rapid response RN called and came bedside to see pt.   1020 Dr. Isidoro Donningai at bedside.  1110 Pt transferred to 2M09, report called to Fleet Contrasachel, Charity fundraiserN.

## 2018-11-22 NOTE — Progress Notes (Signed)
Bilateral lower extremity venous duplex has been completed. Negative for obvious evidence of DVT. Results were given to the patient's nurse, Fleet Contrasachel.  11/22/18 3:29 PM Olen CordialGreg Piers Baade RVT

## 2018-11-22 NOTE — Progress Notes (Signed)
Foley catheter inserted without difficulty. No urine return noted. Patient complaining of pressure and feeling the need to urinate. Foley irrigated with 30cc of normal saline through the flush port. 30cc of saline returned. Patient now complaining of pain and pressure. Noted small amount blood drainage from penis without urine return through foley catheter. PCCM PA Rahul Celine Mansesai notified of this. Consult to urology made.  Urology MD at bedside. Urology MD broke seal of foley catheter and irrigated bladder with catheter tip syringe directly to foley catheter. Return of 5mm blood clot and blood tinged urine noted. Patient states he felt relief after irrigation.  Will continue to closely monitor.

## 2018-11-22 NOTE — Progress Notes (Signed)
Pleural fluid preliminary results show neutrophil predominant exudate.  Concern he has empyema.  D/w pt, and he is agreeable for chest tube insertion.  Also d/w pt's sister Ivor MessierCora.  Updated her about current status and plan of care.  She has requested that medical team contact her on her mobile number for updates.  Coralyn HellingVineet Lillian Ballester, MD [facility] 11/22/2018, 6:29 PM

## 2018-11-22 NOTE — Progress Notes (Signed)
ANTICOAGULATION CONSULT NOTE   Pharmacy Consult for Heparin Indication: atrial fibrillation/rule-out DVT  Allergies  Allergen Reactions  . Nsaids Other (See Comments)    Affect kidneys negatively  . Sulfa Antibiotics Diarrhea    Patient Measurements: Height: 6' (182.9 cm) Weight: (!) 310 lb 3 oz (140.7 kg) IBW/kg (Calculated) : 77.6 Heparin Dosing Weight: 109.5 kg  Vital Signs: Temp: 97.5 F (36.4 C) (11/25 0620) Temp Source: Oral (11/25 0620) BP: 89/65 (11/25 0620) Pulse Rate: 91 (11/25 0620)  Labs: Recent Labs    11/26/2018 1014 11/22/2018 1041 11/22/18 0014 11/22/18 0823  HGB 11.7* 13.9 11.4*  --   HCT 39.9 41.0 39.6  --   PLT 208  --  242  --   HEPARINUNFRC  --   --  0.32 0.40  CREATININE 2.56* 2.80* 3.03*  --   TROPONINI <0.03  --   --   --     Estimated Creatinine Clearance: 38.2 mL/min (A) (by C-G formula based on SCr of 3.03 mg/dL (H)).   Medical History: Past Medical History:  Diagnosis Date  . Congestive heart disease (HCC)   . DVT (deep venous thrombosis) (HCC)   . Hypercholesteremia   . Hypertension   . Renal disorder     Medications:  Scheduled:  . albuterol  2.5 mg Nebulization Q4H  . aspirin EC  81 mg Oral Daily  . furosemide  80 mg Intravenous Q12H  . metoprolol tartrate  12.5 mg Oral BID  . rosuvastatin  20 mg Oral QHS  . sodium chloride flush  3 mL Intravenous Q12H    Assessment: 57 yom from nursing home complaining of SOB, CP, and progressive LE swelling - not on anticoag PTA. EKG showing atrial tachycardia vs Afib. -D-dimer 2.78.  -heparin level = 0.4, hg 13.9>> 11.4 (probably dilutional)  Goal of Therapy:  Heparin level 0.3-0.7 units/ml Monitor platelets by anticoagulation protocol: Yes   Plan:  -No heparin changes needed -Daily heparin level and CBC  Harland GermanAndrew Jennier Schissler, PharmD Clinical Pharmacist **Pharmacist phone directory can now be found on [email or web address] ([email or web address]).  Listed under [facility].

## 2018-11-22 NOTE — Progress Notes (Signed)
Pharmacy Antibiotic Note  Peri JeffersonWilliam L Delira is a 57 y.o. male  With concern of sepsis.  Pharmacy has been consulted for zosyn and vancomycin  Dosing. -WBC= 24.9, SCr= 3.03 (trend up; recent baseline ~ 2-2.3), CrCl ~ 38  Plan: -Zosyn 3.375gm IV q8h -Vancomycin 2500mg  IV x1 followed by 1500mg  IV q24hr -Will follow renal function, cultures and clinical progress   Height: 6' (182.9 cm) Weight: (!) 310 lb 3 oz (140.7 kg) IBW/kg (Calculated) : 77.6  Temp (24hrs), Avg:97.9 F (36.6 C), Min:97.5 F (36.4 C), Max:98.7 F (37.1 C)  Recent Labs  Lab 03/29/2018 1014 03/29/2018 1039 03/29/2018 1041 11/22/18 0014  WBC 23.8*  --   --  24.9*  CREATININE 2.56*  --  2.80* 3.03*  LATICACIDVEN  --  2.13*  --   --     Estimated Creatinine Clearance: 38.2 mL/min (A) (by C-G formula based on SCr of 3.03 mg/dL (H)).    Allergies  Allergen Reactions  . Nsaids Other (See Comments)    Affect kidneys negatively  . Sulfa Antibiotics Diarrhea    Antimicrobials this admission: 11/25 vanc>> 11/25 zosyn>>  Dose adjustments this admission:   Microbiology results:   Thank you for allowing pharmacy to be a part of this patient's care.  Harland GermanAndrew Katelee Schupp, PharmD Clinical Pharmacist **Pharmacist phone directory can now be found on [email or web address] ([email or web address]).  Listed under [facility].

## 2018-11-22 NOTE — Progress Notes (Signed)
BP noted to be 55/31 (39) mmHg and sustaining. PCCM PA Celine Mansesai paged. Orders to beigin Neosynephrine infusion and to insert radial arterial line received. Will continue to closely monitor.

## 2018-11-22 NOTE — Progress Notes (Signed)
Came in to give patient breathing treatment.  Patient's sats were not showing up on monitor.  Changed pulse ox and replaced on finger.  Fingers were cold so placed on ear.  O2 sats were better on ear.  Raised flow to 5L on [place] and proceeded with Albuterol treatment.  BS were very diminished but sounded clearer than before.  Sats are staying above 91%, will continue to monitor patient.

## 2018-11-22 NOTE — Progress Notes (Signed)
Heparin gtt not infusing on assessment, but order in place, stopped for thoracentesis, note from time of procedure states to restart heparin gtt after procedure. Some bleeding from foley site and minimal serosanguinous drainage from chest tube. Heparin restarted per Dr. Arsenio LoaderSommer.

## 2018-11-23 ENCOUNTER — Inpatient Hospital Stay (HOSPITAL_COMMUNITY): Payer: Medicare HMO

## 2018-11-23 DIAGNOSIS — N184 Chronic kidney disease, stage 4 (severe): Secondary | ICD-10-CM

## 2018-11-23 DIAGNOSIS — R6520 Severe sepsis without septic shock: Secondary | ICD-10-CM

## 2018-11-23 DIAGNOSIS — A4102 Sepsis due to Methicillin resistant Staphylococcus aureus: Principal | ICD-10-CM

## 2018-11-23 DIAGNOSIS — Z882 Allergy status to sulfonamides status: Secondary | ICD-10-CM

## 2018-11-23 DIAGNOSIS — A419 Sepsis, unspecified organism: Secondary | ICD-10-CM

## 2018-11-23 DIAGNOSIS — R7881 Bacteremia: Secondary | ICD-10-CM

## 2018-11-23 DIAGNOSIS — E785 Hyperlipidemia, unspecified: Secondary | ICD-10-CM

## 2018-11-23 DIAGNOSIS — R931 Abnormal findings on diagnostic imaging of heart and coronary circulation: Secondary | ICD-10-CM

## 2018-11-23 DIAGNOSIS — R188 Other ascites: Secondary | ICD-10-CM

## 2018-11-23 DIAGNOSIS — I130 Hypertensive heart and chronic kidney disease with heart failure and stage 1 through stage 4 chronic kidney disease, or unspecified chronic kidney disease: Secondary | ICD-10-CM

## 2018-11-23 DIAGNOSIS — Z978 Presence of other specified devices: Secondary | ICD-10-CM

## 2018-11-23 DIAGNOSIS — B9562 Methicillin resistant Staphylococcus aureus infection as the cause of diseases classified elsewhere: Secondary | ICD-10-CM

## 2018-11-23 DIAGNOSIS — J15212 Pneumonia due to Methicillin resistant Staphylococcus aureus: Secondary | ICD-10-CM

## 2018-11-23 DIAGNOSIS — N179 Acute kidney failure, unspecified: Secondary | ICD-10-CM

## 2018-11-23 DIAGNOSIS — Z888 Allergy status to other drugs, medicaments and biological substances status: Secondary | ICD-10-CM

## 2018-11-23 DIAGNOSIS — I5031 Acute diastolic (congestive) heart failure: Secondary | ICD-10-CM

## 2018-11-23 DIAGNOSIS — Z87891 Personal history of nicotine dependence: Secondary | ICD-10-CM

## 2018-11-23 DIAGNOSIS — Z86718 Personal history of other venous thrombosis and embolism: Secondary | ICD-10-CM

## 2018-11-23 LAB — BLOOD CULTURE ID PANEL (REFLEXED)
ACINETOBACTER BAUMANNII: NOT DETECTED
Candida albicans: NOT DETECTED
Candida glabrata: NOT DETECTED
Candida krusei: NOT DETECTED
Candida parapsilosis: NOT DETECTED
Candida tropicalis: NOT DETECTED
Enterobacter cloacae complex: NOT DETECTED
Enterobacteriaceae species: NOT DETECTED
Enterococcus species: NOT DETECTED
Escherichia coli: NOT DETECTED
HAEMOPHILUS INFLUENZAE: NOT DETECTED
KLEBSIELLA OXYTOCA: NOT DETECTED
Klebsiella pneumoniae: NOT DETECTED
Listeria monocytogenes: NOT DETECTED
METHICILLIN RESISTANCE: DETECTED — AB
Neisseria meningitidis: NOT DETECTED
Proteus species: NOT DETECTED
Pseudomonas aeruginosa: NOT DETECTED
SERRATIA MARCESCENS: NOT DETECTED
STAPHYLOCOCCUS AUREUS BCID: DETECTED — AB
STAPHYLOCOCCUS SPECIES: DETECTED — AB
STREPTOCOCCUS AGALACTIAE: NOT DETECTED
STREPTOCOCCUS SPECIES: NOT DETECTED
Streptococcus pneumoniae: NOT DETECTED
Streptococcus pyogenes: NOT DETECTED

## 2018-11-23 LAB — CBC
HEMATOCRIT: 38.6 % — AB (ref 39.0–52.0)
HEMOGLOBIN: 10.7 g/dL — AB (ref 13.0–17.0)
MCH: 18.9 pg — ABNORMAL LOW (ref 26.0–34.0)
MCHC: 27.7 g/dL — ABNORMAL LOW (ref 30.0–36.0)
MCV: 68.2 fL — ABNORMAL LOW (ref 80.0–100.0)
NRBC: 2.8 % — AB (ref 0.0–0.2)
Platelets: 269 10*3/uL (ref 150–400)
RBC: 5.66 MIL/uL (ref 4.22–5.81)
RDW: 20.5 % — ABNORMAL HIGH (ref 11.5–15.5)
WBC: 22.4 10*3/uL — AB (ref 4.0–10.5)

## 2018-11-23 LAB — GLUCOSE, CAPILLARY
Glucose-Capillary: 38 mg/dL — CL (ref 70–99)
Glucose-Capillary: 108 mg/dL — ABNORMAL HIGH (ref 70–99)
Glucose-Capillary: 58 mg/dL — ABNORMAL LOW (ref 70–99)
Glucose-Capillary: 72 mg/dL (ref 70–99)

## 2018-11-23 LAB — BASIC METABOLIC PANEL
Anion gap: 13 (ref 5–15)
BUN: 87 mg/dL — ABNORMAL HIGH (ref 6–20)
CHLORIDE: 93 mmol/L — AB (ref 98–111)
CO2: 25 mmol/L (ref 22–32)
Calcium: 6.8 mg/dL — ABNORMAL LOW (ref 8.9–10.3)
Creatinine, Ser: 4.14 mg/dL — ABNORMAL HIGH (ref 0.61–1.24)
GFR calc non Af Amer: 14 mL/min — ABNORMAL LOW (ref >60)
GFR, EST AFRICAN AMERICAN: 17 mL/min — AB (ref >60)
Glucose, Bld: 63 mg/dL — ABNORMAL LOW (ref 70–99)
POTASSIUM: 5.2 mmol/L — AB (ref 3.5–5.1)
SODIUM: 131 mmol/L — AB (ref 135–145)

## 2018-11-23 LAB — TRIGLYCERIDES, BODY FLUIDS: Triglycerides, Fluid: 55 mg/dL

## 2018-11-23 MED ORDER — MORPHINE SULFATE (PF) 2 MG/ML IV SOLN: 2.0000 mg | INTRAVENOUS | Status: DC | PRN

## 2018-11-23 MED ORDER — POLYVINYL ALCOHOL 1.4 % OP SOLN
1.0000 [drp] | Freq: Four times a day (QID) | OPHTHALMIC | Status: DC | PRN
  Filled 2018-11-23: qty 15

## 2018-11-23 MED ORDER — DIPHENHYDRAMINE HCL 50 MG/ML IJ SOLN: 25.0000 mg | INTRAMUSCULAR | Status: DC | PRN

## 2018-11-23 MED ORDER — GLYCOPYRROLATE 1 MG PO TABS: 1.0000 mg | ORAL_TABLET | ORAL | Status: DC | PRN

## 2018-11-23 MED ORDER — VANCOMYCIN VARIABLE DOSE PER UNSTABLE RENAL FUNCTION (PHARMACIST DOSING): Status: DC

## 2018-11-23 MED ORDER — ORAL CARE MOUTH RINSE: 15.0000 mL | Freq: Two times a day (BID) | OROMUCOSAL | Status: DC

## 2018-11-23 MED ORDER — DEXTROSE 50 % IV SOLN
1.0000 | Freq: Once | INTRAVENOUS | Status: AC
  Administered 2018-11-23: 50 mL via INTRAVENOUS

## 2018-11-23 MED ORDER — LACTATED RINGERS IV BOLUS
1000.0000 mL | Freq: Once | INTRAVENOUS | Status: AC
  Administered 2018-11-23: 1000 mL via INTRAVENOUS

## 2018-11-23 MED ORDER — HEPARIN SODIUM (PORCINE) 5000 UNIT/ML IJ SOLN
5000.0000 [IU] | Freq: Three times a day (TID) | INTRAMUSCULAR | Status: DC
  Administered 2018-11-23: 5000 [IU] via SUBCUTANEOUS
  Filled 2018-11-23: qty 1

## 2018-11-23 MED ORDER — MORPHINE 100MG IN NS 100ML (1MG/ML) PREMIX INFUSION
0.0000 mg/h | INTRAVENOUS | Status: DC
  Administered 2018-11-23: 5 mg/h via INTRAVENOUS
  Filled 2018-11-23: qty 100

## 2018-11-23 MED ORDER — HYDROCORTISONE NA SUCCINATE PF 100 MG IJ SOLR
50.0000 mg | Freq: Four times a day (QID) | INTRAMUSCULAR | Status: DC
  Administered 2018-11-23: 50 mg via INTRAVENOUS
  Filled 2018-11-23: qty 2

## 2018-11-23 MED ORDER — LINEZOLID 600 MG/300ML IV SOLN
600.0000 mg | Freq: Two times a day (BID) | INTRAVENOUS | Status: DC
  Filled 2018-11-23: qty 300

## 2018-11-23 MED ORDER — DEXTROSE 50 % IV SOLN
INTRAVENOUS | Status: AC
  Administered 2018-11-23: 50 mL via INTRAVENOUS
  Filled 2018-11-23: qty 50

## 2018-11-23 MED ORDER — MORPHINE BOLUS VIA INFUSION
5.0000 mg | INTRAVENOUS | Status: DC | PRN
  Filled 2018-11-23: qty 5

## 2018-11-23 MED ORDER — GLYCOPYRROLATE 0.2 MG/ML IJ SOLN: 0.2000 mg | INTRAMUSCULAR | Status: DC | PRN

## 2018-11-23 MED ORDER — SODIUM CHLORIDE 0.9 % IV SOLN: 250.0000 mL | INTRAVENOUS | Status: DC

## 2018-11-23 MED ORDER — DEXTROSE 5 % IV SOLN: INTRAVENOUS | Status: DC

## 2018-11-23 MED ORDER — ALBUMIN HUMAN 25 % IV SOLN
25.0000 g | Freq: Four times a day (QID) | INTRAVENOUS | Status: DC
  Administered 2018-11-23: 25 g via INTRAVENOUS
  Filled 2018-11-23: qty 100

## 2018-11-23 MED ORDER — PHENYLEPHRINE HCL-NACL 10-0.9 MG/250ML-% IV SOLN
25.0000 ug/min | INTRAVENOUS | Status: DC
  Administered 2018-11-23: 13.333 ug/min via INTRAVENOUS
  Administered 2018-11-23 (×2): 200 ug/min via INTRAVENOUS
  Filled 2018-11-23 (×3): qty 250

## 2018-11-23 MED ORDER — NOREPINEPHRINE 4 MG/250ML-% IV SOLN
2.0000 ug/min | INTRAVENOUS | Status: DC
  Administered 2018-11-23: 7 ug/min via INTRAVENOUS
  Filled 2018-11-23: qty 250

## 2018-11-23 MED ORDER — SODIUM CHLORIDE 0.9 % IV SOLN
250.0000 mL | INTRAVENOUS | Status: DC
  Administered 2018-11-23: 250 mL via INTRAVENOUS

## 2018-11-23 MED ORDER — ACETAMINOPHEN 650 MG RE SUPP: 650.0000 mg | Freq: Four times a day (QID) | RECTAL | Status: DC | PRN

## 2018-11-23 MED ORDER — ACETAMINOPHEN 325 MG PO TABS: 650.0000 mg | ORAL_TABLET | Freq: Four times a day (QID) | ORAL | Status: DC | PRN

## 2018-11-24 LAB — QUANTIFERON-TB GOLD PLUS: QuantiFERON-TB Gold Plus: NEGATIVE

## 2018-11-24 LAB — BODY FLUID CULTURE

## 2018-11-24 LAB — QUANTIFERON-TB GOLD PLUS (RQFGPL)
QUANTIFERON MITOGEN VALUE: 1.31 [IU]/mL
QUANTIFERON NIL VALUE: 0.05 [IU]/mL
QUANTIFERON TB2 AG VALUE: 0.05 [IU]/mL
QuantiFERON TB1 Ag Value: 0.05 IU/mL

## 2018-11-25 LAB — CULTURE, BLOOD (ROUTINE X 2): Special Requests: ADEQUATE

## 2018-11-25 LAB — CHOLESTEROL, BODY FLUID: CHOL FL: 35 mg/dL

## 2018-11-27 LAB — ACID FAST SMEAR (AFB, MYCOBACTERIA): Acid Fast Smear: NEGATIVE

## 2018-11-28 LAB — ADENOSIDE DEAMINASE, PLEURAL FL: ADENOSIDE DEAMINASE, PLEURAL FL: 10 U/L — AB (ref 0.0–9.4)

## 2018-11-28 NOTE — Progress Notes (Signed)
Progress Note  Patient Name: Justin [name] Date of Encounter: 11/24/2018  Primary Cardiologist:   No primary care provider on file.   Subjective   The patient is unresponsive.  Hypotensive.   Inpatient Medications    Scheduled Meds: . albuterol  2.5 mg Nebulization TID  . aspirin EC  81 mg Oral Daily  . Chlorhexidine Gluconate Cloth  6 each Topical Q0600  . heparin injection (subcutaneous)  5,000 Units Subcutaneous Q8H  . hydrocortisone sod succinate (SOLU-CORTEF) inj  50 mg Intravenous Q6H  . mouth rinse  15 mL Mouth Rinse BID  . mupirocin ointment  1 application Nasal BID  . rosuvastatin  20 mg Oral QHS  . sodium chloride flush  3 mL Intravenous Q12H   Continuous Infusions: . sodium chloride 10 mL/hr at 10/30/2018 0800  . sodium chloride    . sodium chloride    . sodium chloride 10 mL/hr at 11/09/2018 1000  . albumin human 25 g (11/25/2018 0852)  . dextrose 50 mL/hr at 11/11/2018 1000  . linezolid (ZYVOX) IV    . norepinephrine (LEVOPHED) Adult infusion 10 mcg/min (11/10/2018 1000)  . phenylephrine (NEO-SYNEPHRINE) Adult infusion 200 mcg/min (11/20/2018 1000)   PRN Meds: sodium chloride, Place/Maintain arterial line **AND** sodium chloride, acetaminophen, ondansetron (ZOFRAN) IV, sodium chloride flush   Vital Signs    Vitals:   11/22/2018 0945 11/12/2018 1000 11/16/2018 1015 11/25/2018 1030  BP: (!) 56/19 (!) 78/38 (!) 63/54 (!) 55/24  Pulse:      Resp: (!) 24 (!) 24 (!) 24 (!) 23  Temp:      TempSrc:      SpO2:      Weight:      Height:        Intake/Output Summary (Last 24 hours) at 10/30/2018 1052 Last data filed at 11/01/2018 1000 Gross per 24 hour  Intake 8266.63 ml  Output 236 ml  Net 8030.63 ml   Filed Weights   2018-03-25 1011 11/22/18 0556 11/10/2018 0127  Weight: (!) 138.6 kg (!) 140.7 kg (!) 142.4 kg    Telemetry    Sinus tach - Personally Reviewed  ECG    NA - Personally Reviewed  Physical Exam   GEN:  Critical  Neck:   Unable to assess  JVD Cardiac: RRR, distant heart sounds.    Respiratory: Decreased breath sounds throught GI:    Distended.  Decreased bowel sounds MS:  Severe diffuse edema Neuro:   Non responsive.     Labs    Chemistry Recent Labs  Lab 2018-03-25 1014 2018-03-25 1041 11/22/18 0014 11/22/2018 0252  NA 132* 130* 130* 131*  K 4.6 4.6 4.6 5.2*  CL 91* 93* 90* 93*  CO2 28  --  28 25  GLUCOSE 88 83 68* 63*  BUN 73* 69* 75* 87*  CREATININE 2.56* 2.80* 3.03* 4.14*  CALCIUM 7.9*  --  7.6* 6.8*  PROT 5.6*  --   --   --   ALBUMIN 1.7*  --   --   --   AST 34  --   --   --   ALT 19  --   --   --   ALKPHOS 114  --   --   --   BILITOT 0.9  --   --   --   GFRNONAA 26*  --  21* 14*  GFRAA 30*  --  24* 17*  ANIONGAP 13  --  12 13     Hematology Recent Labs  Lab 12/09/2018 1014 2018/12/09 1041 11/22/18 0014 11/22/2018 0252  WBC 23.8*  --  24.9* 22.4*  RBC 6.07*  --  6.09* 5.66  HGB 11.7* 13.9 11.4* 10.7*  HCT 39.9 41.0 39.6 38.6*  MCV 65.7*  --  65.0* 68.2*  MCH 19.3*  --  18.7* 18.9*  MCHC 29.3*  --  28.8* 27.7*  RDW 20.0*  --  19.9* 20.5*  PLT 208  --  242 269    Cardiac Enzymes Recent Labs  Lab 2018/12/09 1014  TROPONINI <0.03   No results for input(s): TROPIPOC in the last 168 hours.   BNP Recent Labs  Lab 12/09/2018 1017  BNP 236.1*     DDimer  Recent Labs  Lab 09-Dec-2018 1024  DDIMER 2.78*     Radiology    Ct Abdomen Pelvis Wo Contrast  Result Date: 09-Dec-2018 CLINICAL DATA:  57 year old male with history of chest pain, right-sided flank pain and generalized weakness since yesterday. EXAM: CT CHEST, ABDOMEN AND PELVIS WITHOUT CONTRAST TECHNIQUE: Multidetector CT imaging of the chest, abdomen and pelvis was performed following the standard protocol without IV contrast. COMPARISON:  CT the chest 04/05/2008. CT the abdomen and pelvis 04/03/2008. FINDINGS: CT CHEST FINDINGS Cardiovascular: Heart size is normal. Pericardial thickening and trace amount of pericardial fluid with  pericardial calcification. Narrowing of the cardiac contours, suggesting potential constrictive physiology. There is aortic atherosclerosis, as well as atherosclerosis of the great vessels of the mediastinum and the coronary arteries, including calcified atherosclerotic plaque in the left anterior descending and right coronary arteries. Mediastinum/Nodes: No pathologically enlarged mediastinal or hilar lymph nodes. Please note that accurate exclusion of hilar adenopathy is limited on noncontrast CT scans. Esophagus is unremarkable in appearance. No axillary lymphadenopathy. Lungs/Pleura: Large left pleural effusion with near complete passive atelectasis throughout the left lung. Minimal residual aerated left upper lobe and superior segment of the left lower lobe. Right lung is well aerated. No right-sided pleural effusion. No definite suspicious appearing pulmonary nodules or masses are noted. Musculoskeletal: There are no aggressive appearing lytic or blastic lesions noted in the visualized portions of the skeleton. CT ABDOMEN PELVIS FINDINGS Hepatobiliary: No definite suspicious cystic or solid hepatic lesions are confidently identified on today's noncontrast CT examination. Mild diffuse low attenuation throughout the hepatic parenchyma, indicative of hepatic steatosis. Liver also has a shrunken appearance and nodular contour, indicative of underlying cirrhosis. Gallbladder is poorly demonstrated. Pancreas: No definite pancreatic mass or peripancreatic fluid or inflammatory changes are noted on today's noncontrast CT examination. Spleen: Unremarkable. Adrenals/Urinary Tract: There are no abnormal calcifications within the collecting system of either kidney, along the course of either ureter, or within the lumen of the urinary bladder. No hydroureteronephrosis or perinephric stranding to suggest urinary tract obstruction at this time. The unenhanced appearance of the kidneys is unremarkable bilaterally. Unenhanced  appearance of the urinary bladder is normal. Bilateral adrenal glands are normal in appearance. Stomach/Bowel: Normal appearance of the stomach. No pathologic dilatation of small bowel or colon. A few scattered colonic diverticulae are noted, without definite surrounding inflammatory changes to strongly suggest an acute diverticulitis at this time (assessment is limited by the presence of a small volume of ascites). Normal appendix. Vascular/Lymphatic: No atherosclerotic calcifications noted in the abdominal or pelvic vasculature. Multiple prominent borderline enlarged and mildly enlarged pelvic lymph nodes measuring up 2 1.3 cm in short axis in the right external iliac nodal station, nonspecific and favored to be reactive. Reproductive: Prostate gland and seminal vesicles are unremarkable in appearance. Other:  Small volume of ascites. No pneumoperitoneum. Diffuse body wall edema. Musculoskeletal: There are no aggressive appearing lytic or blastic lesions noted in the visualized portions of the skeleton. IMPRESSION: 1. Large left pleural effusion with near complete passive atelectasis throughout the left lung. 2. Chronic pericardial thickening and calcification with morphologic changes in the heart suggestive of constrictive physiology. Correlation with echocardiography is strongly recommended if not already performed. 3. Morphologic changes in the liver compatible with cirrhosis. There is also evidence of hepatic steatosis. 4. Small volume of ascites. 5. Diffuse body wall edema. 6. Mild colonic diverticulosis. No definite findings to strongly suggest an acute diverticulitis at this time (assessment is slightly limited by the presence of ascites). 7. Additional incidental findings, as above. Electronically Signed   By: Trudie Reed M.D.   On: 11/25/2018 15:06   Ct Chest Wo Contrast  Result Date: 11/06/2018 CLINICAL DATA:  57 year old male with history of chest pain, right-sided flank pain and generalized  weakness since yesterday. EXAM: CT CHEST, ABDOMEN AND PELVIS WITHOUT CONTRAST TECHNIQUE: Multidetector CT imaging of the chest, abdomen and pelvis was performed following the standard protocol without IV contrast. COMPARISON:  CT the chest 04/05/2008. CT the abdomen and pelvis 04/03/2008. FINDINGS: CT CHEST FINDINGS Cardiovascular: Heart size is normal. Pericardial thickening and trace amount of pericardial fluid with pericardial calcification. Narrowing of the cardiac contours, suggesting potential constrictive physiology. There is aortic atherosclerosis, as well as atherosclerosis of the great vessels of the mediastinum and the coronary arteries, including calcified atherosclerotic plaque in the left anterior descending and right coronary arteries. Mediastinum/Nodes: No pathologically enlarged mediastinal or hilar lymph nodes. Please note that accurate exclusion of hilar adenopathy is limited on noncontrast CT scans. Esophagus is unremarkable in appearance. No axillary lymphadenopathy. Lungs/Pleura: Large left pleural effusion with near complete passive atelectasis throughout the left lung. Minimal residual aerated left upper lobe and superior segment of the left lower lobe. Right lung is well aerated. No right-sided pleural effusion. No definite suspicious appearing pulmonary nodules or masses are noted. Musculoskeletal: There are no aggressive appearing lytic or blastic lesions noted in the visualized portions of the skeleton. CT ABDOMEN PELVIS FINDINGS Hepatobiliary: No definite suspicious cystic or solid hepatic lesions are confidently identified on today's noncontrast CT examination. Mild diffuse low attenuation throughout the hepatic parenchyma, indicative of hepatic steatosis. Liver also has a shrunken appearance and nodular contour, indicative of underlying cirrhosis. Gallbladder is poorly demonstrated. Pancreas: No definite pancreatic mass or peripancreatic fluid or inflammatory changes are noted on  today's noncontrast CT examination. Spleen: Unremarkable. Adrenals/Urinary Tract: There are no abnormal calcifications within the collecting system of either kidney, along the course of either ureter, or within the lumen of the urinary bladder. No hydroureteronephrosis or perinephric stranding to suggest urinary tract obstruction at this time. The unenhanced appearance of the kidneys is unremarkable bilaterally. Unenhanced appearance of the urinary bladder is normal. Bilateral adrenal glands are normal in appearance. Stomach/Bowel: Normal appearance of the stomach. No pathologic dilatation of small bowel or colon. A few scattered colonic diverticulae are noted, without definite surrounding inflammatory changes to strongly suggest an acute diverticulitis at this time (assessment is limited by the presence of a small volume of ascites). Normal appendix. Vascular/Lymphatic: No atherosclerotic calcifications noted in the abdominal or pelvic vasculature. Multiple prominent borderline enlarged and mildly enlarged pelvic lymph nodes measuring up 2 1.3 cm in short axis in the right external iliac nodal station, nonspecific and favored to be reactive. Reproductive: Prostate gland and seminal vesicles are unremarkable  in appearance. Other: Small volume of ascites. No pneumoperitoneum. Diffuse body wall edema. Musculoskeletal: There are no aggressive appearing lytic or blastic lesions noted in the visualized portions of the skeleton. IMPRESSION: 1. Large left pleural effusion with near complete passive atelectasis throughout the left lung. 2. Chronic pericardial thickening and calcification with morphologic changes in the heart suggestive of constrictive physiology. Correlation with echocardiography is strongly recommended if not already performed. 3. Morphologic changes in the liver compatible with cirrhosis. There is also evidence of hepatic steatosis. 4. Small volume of ascites. 5. Diffuse body wall edema. 6. Mild colonic  diverticulosis. No definite findings to strongly suggest an acute diverticulitis at this time (assessment is slightly limited by the presence of ascites). 7. Additional incidental findings, as above. Electronically Signed   By: Trudie Reed M.D.   On: December 11, 2018 15:06   Dg Chest Port 1 View  Result Date: 11/11/2018 CLINICAL DATA:  Status post left chest tube placement EXAM: PORTABLE CHEST 1 VIEW COMPARISON:  11/22/2018 FINDINGS: Cardiac shadow remains enlarged. Large left pleural effusion is again identified and stable from the previous day. Stable chest tube is noted in the left lung base. Underlying consolidation of the left lung is present as well. Mild right basilar atelectasis is seen. IMPRESSION: Stable left pleural effusion with chest tube in place. Mild right basilar atelectasis is noted. Electronically Signed   By: Alcide Clever M.D.   On: 10/31/2018 07:19   Dg Chest Port 1 View  Result Date: 11/22/2018 CLINICAL DATA:  Chest tube placement. EXAM: PORTABLE CHEST 1 VIEW COMPARISON:  One-view chest x-ray 11/22/2018 FINDINGS: A left-sided chest tube is in place. Extends along the inferior aspect of the pleural surface. There is slight decrease in the left pleural effusion. No pneumothorax is present. Moderate edema remains. IMPRESSION: 1. Slight decrease in left pleural effusion following placement of chest tube. 2. The chest tube is positioned at the inferior aspect pleural space. 3. Moderate edema. 4. Cardiac shadow is obscured by the fusion. Electronically Signed   By: Marin Roberts M.D.   On: 11/22/2018 20:00   Dg Chest Port 1 View  Result Date: 11/22/2018 CLINICAL DATA:  57 year old male with a history of left-sided thoracentesis EXAM: PORTABLE CHEST 1 VIEW COMPARISON:  CT Dec 11, 2018, chest x-ray 11-Dec-2018 FINDINGS: Cardiomediastinal silhouette likely unchanged though partially obscured by overlying lung/pleural disease. Low lung volumes. Dense opacity of the left chest opacifies  the left hemidiaphragm in the left heart border. The a past the is relatively unchanged from the comparison plain film. No pneumothorax observed. Interlobular septal thickening bilateral lungs with patchy opacities of the right lung. IMPRESSION: Similar appearance of left chest opacity, compatible with persisting/recurrent pleural fluid and associated atelectasis/consolidation. Low lung volumes with pulmonary edema. Electronically Signed   By: Gilmer Mor D.O.   On: 11/22/2018 14:54   Vas [place] Lower Extremity Venous (dvt)  Result Date: 11/22/2018  Lower Venous Study Indications: Swelling, Edema, and Elevated Ddimer.  Limitations: Body habitus and poor ultrasound/tissue interface. Performing Technologist: Chanda Busing RVT  Examination Guidelines: A complete evaluation includes B-mode imaging, spectral Doppler, color Doppler, and power Doppler as needed of all accessible portions of each vessel. Bilateral testing is considered an integral part of a complete examination. Limited examinations for reoccurring indications may be performed as noted.  Right Venous Findings: +---------+---------------+---------+-----------+----------+-------+          CompressibilityPhasicitySpontaneityPropertiesSummary +---------+---------------+---------+-----------+----------+-------+ CFV      Full           Yes  Yes                          +---------+---------------+---------+-----------+----------+-------+ SFJ      Full                                                 +---------+---------------+---------+-----------+----------+-------+ FV Prox  Full                                                 +---------+---------------+---------+-----------+----------+-------+ FV Mid                  Yes      Yes                          +---------+---------------+---------+-----------+----------+-------+ FV DistalFull                                                  +---------+---------------+---------+-----------+----------+-------+ PFV      Full                                                 +---------+---------------+---------+-----------+----------+-------+ POP      Full           Yes      Yes                          +---------+---------------+---------+-----------+----------+-------+ PTV      Full                                                 +---------+---------------+---------+-----------+----------+-------+ PERO     Full                                                 +---------+---------------+---------+-----------+----------+-------+  Left Venous Findings: +---------+---------------+---------+-----------+----------+-------+          CompressibilityPhasicitySpontaneityPropertiesSummary +---------+---------------+---------+-----------+----------+-------+ CFV      Full           Yes      Yes                          +---------+---------------+---------+-----------+----------+-------+ SFJ      Full                                                 +---------+---------------+---------+-----------+----------+-------+ FV Prox  Full                                                 +---------+---------------+---------+-----------+----------+-------+  FV Mid                  Yes      Yes                          +---------+---------------+---------+-----------+----------+-------+ FV Distal               Yes      Yes                          +---------+---------------+---------+-----------+----------+-------+ PFV      Full           Yes      Yes                          +---------+---------------+---------+-----------+----------+-------+ POP      Full                                                 +---------+---------------+---------+-----------+----------+-------+ PTV      Full                                                  +---------+---------------+---------+-----------+----------+-------+    Summary: Right: There is no evidence of deep vein thrombosis in the lower extremity. However, portions of this examination were limited- see technologist comments above. No cystic structure found in the popliteal fossa. Left: There is no evidence of deep vein thrombosis in the lower extremity. However, portions of this examination were limited- see technologist comments above. No cystic structure found in the popliteal fossa.  *See table(s) above for measurements and observations. Electronically signed by Waverly Ferrari MD on 11/22/2018 at 4:15:58 PM.    Final     Cardiac Studies   Echo pending  Patient Profile     57 y.o. male with a hx of chronic diastolic heart failure who is being seen for the evaluation of acute on chronic diastolic heart failure at the request of Dr. Elvera Lennox.  Assessment & Plan    ACUTE ON CHRONIC DIASTOLIC HF:  Now in shock with oliguria.  Refractory hypotension.  Unresponsive to vasopressors.  Discussed with family.  Likely will not survive today.   Palliative care would be appropriate.   LEFT PLEURAL EFFUSION:    MRSA pneumonia with empyema.    TACHYCARDIA:  Secondary.  Appeared to have atrial fib yesterday.  No change in therapy.    ACUTE ON CHRONIC RENAL INSUFFICIENCY:   Renal failure progressive secondary to shock.   CHRONIC PERICARDIAL THICKENING:    Echo is pending. This can be cancelled.  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     For questions or updates, please contact [facility] Please consult [email or web address] for contact info under Cardiology/STEMI.   Signed, Rollene Rotunda, MD  11/08/2018, 10:52 AM

## 2018-11-28 NOTE — Consult Note (Signed)
[facility]    Date of Admission:  10/29/2018   Total days of antibiotics 1        Day 1 vancomycin                Reason for Consult: MRSA Bacteremia     Referring Provider: CHAMP  Primary Care Provider: Juluis Rainier, MD   Assessment: 57 y.o. male with MRSA bacteremia in the setting of complex L empyema/effusion. He has had a chest tube placed but drainage is very minimal - PCCM considering lytics to help. He is on high doses of pressor support presently with ongoing low BPs. AKI on admission in the setting of likely mixed cardio-septic shock. Will change to linezolid for management of his bacteremia/empyema. Duration pending for now.   He has enlarged cardiac shadow on CXR and CT with thickening of pericardium. Transthoracic echo has been ordered to evaluate this and will help in the setting of his staph bacteremia to look for endocarditis as well. Not likely he will be able to undergo TEE until more stable for now. He will need repeated blood cultures and platelet monitoring on linezolid.   Plan: 1. Stop vancomycin  2. Start linezolid 600 mg IV Q12h  3. TTE ordered 4. Repeat BC in AM 5. Incomplete drainage of L chest - per PCCM for intervention   Principal Problem:   Sepsis (HCC) Active Problems:   CKD (chronic kidney disease) stage 3, GFR 30-59 ml/min (HCC)   (HFpEF) heart failure with preserved ejection fraction- Acute CHF Exacerbation   Hypotension   Hypoglycemia   Pleural effusion, left   Constrictive pericarditis   Acute respiratory failure with hypoxia (HCC)   Dyspnea   Pleural effusion on left   Empyema (HCC)   . albuterol  2.5 mg Nebulization TID  . aspirin EC  81 mg Oral Daily  . Chlorhexidine Gluconate Cloth  6 each Topical Q0600  . heparin injection (subcutaneous)  5,000 Units Subcutaneous Q8H  . hydrocortisone sod succinate (SOLU-CORTEF) inj  50 mg Intravenous Q6H  . mouth rinse  15 mL Mouth Rinse BID  . mupirocin  ointment  1 application Nasal BID  . rosuvastatin  20 mg Oral QHS  . sodium chloride flush  3 mL Intravenous Q12H  . vancomycin variable dose per unstable renal function (pharmacist dosing)   Does not apply See admin instructions    HPI: Justin [name] is a 57 y.o. male admitted on 10/29/2018 with SOB, chest pain and LE swelling. He was prior to admission at [facility] following a previous hospitalization. His past medical history significant for CHF with normal EF, CKD stage IV (baseline creatinine 2 - 2.3), h/o DVT no longer on anticoagulation, HTN, hyperlipidemia.   The patient is currently on BiPAP therapy and minimally responding. History has been taken from chart and his family who are at the bedside. He reported at admission he has noticed more and more fluid building up in his legs over the last few days, rapid weight gain and swelling R calf specifically. Other associated symptoms included intermittent chest pains the night before admission. Denied fevers/chills, cough or chest congestion, sore throat, abd pain n/v/d.   In the ER he was normotensive, afebrile but tachypneic with RR in 30s and tachycardic with HR in 130s (NSR). CT revealed pericardial thickening and trace pericardial fluid, no enlarged mediastinal or hilar LNs, large left pleural effusion with near complete passive atelectasis throughout  the left lung with only minimal residual aerated left upper lobe.   Review of Systems  Unable to perform ROS: Critical illness    Past Medical History:  Diagnosis Date  . Congestive heart disease (HCC)   . DVT (deep venous thrombosis) (HCC)   . Hypercholesteremia   . Hypertension   . Renal disorder     Social History   Tobacco Use  . Smoking status: Former Games developer  . Smokeless tobacco: Never Used  Substance Use Topics  . Alcohol use: No  . Drug use: No    Family History  Problem Relation Age of Onset  . Diabetes Mellitus II Neg Hx   . CAD Neg Hx    Allergies    Allergen Reactions  . Nsaids Other (See Comments)    Affect kidneys negatively  . Sulfa Antibiotics Diarrhea    OBJECTIVE: Blood pressure (!) 65/15, pulse 83, temperature (!) 97.4 F (36.3 C), temperature source Oral, resp. rate 19, height 6' (1.829 m), weight (!) 142.4 kg, SpO2 90 %.  Physical Exam  Constitutional: He appears well-developed and well-nourished.  Resting in bed. Moaning. BiPAP in use.   Eyes: Pupils are equal, round, and reactive to light.  Cardiovascular: Normal rate and regular rhythm. Exam reveals distant heart sounds.  Pulmonary/Chest: Accessory muscle usage present. No tachypnea. He has decreased breath sounds (L>R).  BiPAP 50%  Abdominal: He exhibits distension and ascites.  Musculoskeletal:       Right lower leg: He exhibits edema (pitting edema to hips and abdomen ).  Neurological:  Not awake on BiPAP for assessment.   Skin: Capillary refill takes more than 3 seconds.  Cool extremities. Mottled toes.   Vitals reviewed.   Lab Results Lab Results  Component Value Date   WBC 22.4 (H) 2018-12-11   HGB 10.7 (L) Dec 11, 2018   HCT 38.6 (L) 11-Dec-2018   MCV 68.2 (L) 12/11/18   PLT 269 12/11/18    Lab Results  Component Value Date   CREATININE 4.14 (H) December 11, 2018   BUN 87 (H) December 11, 2018   NA 131 (L) 12-11-18   K 5.2 (H) 2018/12/11   CL 93 (L) 11-Dec-2018   CO2 25 12-11-2018    Lab Results  Component Value Date   ALT 19 11/13/2018   AST 34 10/31/2018   ALKPHOS 114 11/04/2018   BILITOT 0.9 11/03/2018     Microbiology: Recent Results (from the past 240 hour(s))  MRSA PCR Screening     Status: Abnormal   Collection Time: 11/22/18  4:10 AM  Result Value Ref Range Status   MRSA by PCR POSITIVE (A) NEGATIVE Final    Comment:        The GeneXpert MRSA Assay (FDA approved for NASAL specimens only), is one component of a comprehensive MRSA colonization surveillance program. It is not intended to diagnose MRSA infection nor to guide  or monitor treatment for MRSA infections. RESULT CALLED TO, READ BACK BY AND VERIFIED WITH: Ulyses Southward RN 9:55 11/22/18 ([email or web address]) Performed at [facility], [address], [place], [place] [address]   Culture, blood (routine x 2)     Status: None (Preliminary result)   Collection Time: 11/22/18 10:50 AM  Result Value Ref Range Status   Specimen Description BLOOD RIGHT ANTECUBITAL  Final   Special Requests   Final    BOTTLES DRAWN AEROBIC AND ANAEROBIC Blood Culture adequate volume   Culture  Setup Time   Final    GRAM POSITIVE COCCI IN BOTH AEROBIC  AND ANAEROBIC BOTTLES Organism ID to follow CRITICAL RESULT CALLED TO, READ BACK BY AND VERIFIED WITHMelven Sartorius: J [facility] [id number] 27-May-2018 A BROWNING    Culture   Final    NO GROWTH < 24 HOURS Performed at [facility], [address] [place], [place] [address]    Report Status PENDING  Incomplete  Blood Culture ID Panel (Reflexed)     Status: Abnormal   Collection Time: 11/22/18 10:50 AM  Result Value Ref Range Status   Enterococcus species NOT DETECTED NOT DETECTED Final   Listeria monocytogenes NOT DETECTED NOT DETECTED Final   Staphylococcus species DETECTED (A) NOT DETECTED Final    Comment: CRITICAL RESULT CALLED TO, READ BACK BY AND VERIFIED WITHShela Commons: J [facility] PHARMD [id number] 27-May-2018 A BROWNING    Staphylococcus aureus (BCID) DETECTED (A) NOT DETECTED Final    Comment: Methicillin (oxacillin)-resistant Staphylococcus aureus (MRSA). MRSA is predictably resistant to beta-lactam antibiotics (except ceftaroline). Preferred therapy is vancomycin unless clinically contraindicated. Patient requires contact precautions if  hospitalized. CRITICAL RESULT CALLED TO, READ BACK BY AND VERIFIED WITHShela Commons: J [facility] PHARMD [id number] 27-May-2018 A BROWNING    Methicillin resistance DETECTED (A) NOT DETECTED Final    Comment: CRITICAL RESULT CALLED TO, READ BACK BY AND VERIFIED WITHMelven Sartorius: J [facility] PHARMD [id number] 27-May-2018 A BROWNING    Streptococcus species  NOT DETECTED NOT DETECTED Final   Streptococcus agalactiae NOT DETECTED NOT DETECTED Final   Streptococcus pneumoniae NOT DETECTED NOT DETECTED Final   Streptococcus pyogenes NOT DETECTED NOT DETECTED Final   Acinetobacter baumannii NOT DETECTED NOT DETECTED Final   Enterobacteriaceae species NOT DETECTED NOT DETECTED Final   Enterobacter cloacae complex NOT DETECTED NOT DETECTED Final   Escherichia coli NOT DETECTED NOT DETECTED Final   Klebsiella oxytoca NOT DETECTED NOT DETECTED Final   Klebsiella pneumoniae NOT DETECTED NOT DETECTED Final   Proteus species NOT DETECTED NOT DETECTED Final   Serratia marcescens NOT DETECTED NOT DETECTED Final   Haemophilus influenzae NOT DETECTED NOT DETECTED Final   Neisseria meningitidis NOT DETECTED NOT DETECTED Final   Pseudomonas aeruginosa NOT DETECTED NOT DETECTED Final   Candida albicans NOT DETECTED NOT DETECTED Final   Candida glabrata NOT DETECTED NOT DETECTED Final   Candida krusei NOT DETECTED NOT DETECTED Final   Candida parapsilosis NOT DETECTED NOT DETECTED Final   Candida tropicalis NOT DETECTED NOT DETECTED Final    Comment: Performed at [facility], [address] [place], [place] [address]  Culture, blood (routine x 2)     Status: None (Preliminary result)   Collection Time: 11/22/18 10:55 AM  Result Value Ref Range Status   Specimen Description BLOOD RIGHT HAND  Final   Special Requests   Final    BOTTLES DRAWN AEROBIC AND ANAEROBIC Blood Culture results may not be optimal due to an inadequate volume of blood received in culture bottles   Culture  Setup Time   Final    GRAM POSITIVE COCCI IN BOTH AEROBIC AND ANAEROBIC BOTTLES CRITICAL RESULT CALLED TO, READ BACK BY AND VERIFIED WITHShela Commons: J [facility] PHARMD [id number] 27-May-2018 A BROWNING    Culture   Final    NO GROWTH < 24 HOURS Performed at [facility], [address] [place], [place] [address]    Report Status PENDING  Incomplete  Body fluid culture     Status: None  (Preliminary result)   Collection Time: 11/22/18  3:09 PM  Result Value Ref Range Status   Specimen Description PLEURAL LEFT  Final   Special Requests   Final    NONE Performed at [facility], [address], [place], [place] [address]    Gram Stain   Final    WBC PRESENT, PREDOMINANTLY PMN GRAM POSITIVE COCCI CYTOSPIN SMEAR    Culture FEW STAPHYLOCOCCUS AUREUS  Final   Report Status PENDING  Incomplete    Rexene Alberts, MSN, NP-C [facility] Cell: [phone] Pager: [phone]  27-Nov-2018 10:34 AM

## 2018-11-28 NOTE — Progress Notes (Addendum)
Pharmacy Antibiotic Note  Justin [name] is a 57 y.o. male admitted on 03-13-18 with MRSA pneumonia with empyema and secondary bacteremia.  Pharmacy has been consulted for Zosyn/Vancomycin dosing, patient loaded vancomycin 2.5g 11/25.  -WBC 22.4 <<24.9 at admit -afebrile -Scr 4.14 >>3.03 at admit  Plan: Per BCx results of MRSA, d/c Zosyn. Patient has now anuric.  Hold next vancomycin dose until either renal function improves. Vancomycin per unstable renal function ordered. Consider vancomycin trough as needed.  Height: 6' (182.9 cm) Weight: (!) 313 lb 15 oz (142.4 kg) IBW/kg (Calculated) : 77.6  Temp (24hrs), Avg:98 F (36.7 C), Min:97.4 F (36.3 C), Max:98.8 F (37.1 C)  Recent Labs  Lab 07/11/2018 1014 07/11/2018 1039 07/11/2018 1041 11/22/18 0014 11/22/18 1058 11/09/2018 0252  WBC 23.8*  --   --  24.9*  --  22.4*  CREATININE 2.56*  --  2.80* 3.03*  --  4.14*  LATICACIDVEN  --  2.13*  --   --  3.5*  --     Estimated Creatinine Clearance: 28.1 mL/min (A) (by C-G formula based on SCr of 4.14 mg/dL (H)).    Allergies  Allergen Reactions  . Nsaids Other (See Comments)    Affect kidneys negatively  . Sulfa Antibiotics Diarrhea    Antimicrobials this admission: Pipericillin-tazobactam 11/25 >> 11/26 Vancomycin 11/25 >>   Dose adjustments this admission: Discontinue Zosyn. Hold Vancomycin until urine function improves. Monitor UOP, Scr daily.  Microbiology results: 11/25 BCx: MRSA 11/25 UCx: pending 11/25 MRSA PCR: positive  Thank you for allowing pharmacy to be a part of this patient's care.  Richardean Canalajihia  Doyl Bitting 11/22/2018 8:40 AM

## 2018-11-28 NOTE — Progress Notes (Addendum)
ID PROGRESS NOTE  Patient has MRSA pneumonia with empyema and secondary bacteremia. We will change his vancomycin to linezolid for the time being due to worsening kidney function. Can d/c piptazo.  Will provide formal recommendations shortly  Please repeat blood cx this afternoon, and TTE pending  Antoni Stefan B. Drue SecondSnider MD MPH [facility] [phone]

## 2018-11-28 NOTE — Progress Notes (Signed)
[facility]  [facility] will follow pt with ID team to support Home Infusion Pharmacy services for home IV ABX if needed/ordered at DC.  If patient discharges after hours, please call [phone].   Justin [name] 12/26/18, 8:20 AM

## 2018-11-28 NOTE — Progress Notes (Addendum)
NAME:  Justin [name], MRN:  [id number], DOB:  04/28/1961, LOS: 2 ADMISSION DATE:  11-29-18, CONSULTATION DATE:  11/22/18 REFERRING MD:  Justin [name]  CHIEF COMPLAINT:  Hypotension   Brief History   Justin [name] is a 57 y.o. male, SNF resident, who was admitted 11/24 with dyspnea due to large left pleural effusion vs empyema.  Initially thought secondary to AoC dCHF, AoCKD.  Started on empiric heparin for possible RLE DVT. 11/25 had AMS due to hypoglycemia and hypotension.  Transferred to ICU for further workup and for bedside thoracentesis. Empirically covered with vanc / zosyn given WBC 25 for suspected empyema.     Past Medical History  CKD IV, HTN, HLD, RLE DVT no longer on anticoagulation, dCHF.  Significant Hospital Events   11/24 Admit. 11/25 Transfer to ICU. 11/26  Worsening shock, AKI, GPC's in blood   Consults:  PCCM.  Procedures:  Left thoracentesis 11/25 >  R/LHC planned (unknown date) >   Significant Diagnostic Tests:  CXR 11/24 > large left pleural effusion. CT chest / A / P 11/24 > large left pleural effusion with compressive atelectasis, chronic pericardial thickening with findings suggestive of constrictive physiology, morphologic changes in liver compatible with cirrhosis with evidence of hepatic steatosis, small volume ascites, diffuse body wall edema. LE Duplex 11//25 >> limited study, no evidence of DVT  Micro Data:  Blood 11/25 >> GPC 2/2 >>  Antimicrobials:  Vanc 11/25 >  Zosyn 11/25 > 11/26  Interim history/subjective:  RN reports ongoing hypotension, difficult to read blood pressures.  Pt denies pain / SOB.  Objective:  Blood pressure (!) 64/47, pulse 83, temperature (!) 97.4 F (36.3 C), temperature source Oral, resp. rate (!) 21, height 6' (1.829 m), weight (!) 142.4 kg, SpO2 90 %.    FiO2 (%):  [50 %] 50 %   Intake/Output Summary (Last 24 hours) at 11/15/2018 0815 Last data filed at 11/05/2018 0700 Gross per 24 hour  Intake 6234.52 ml    Output 236 ml  Net 5998.52 ml   Filed Weights   11/29/18 1011 11/22/18 0556 11/16/2018 0127  Weight: (!) 138.6 kg (!) 140.7 kg (!) 142.4 kg    Examination: General: chronically ill appearing male lying in bed on BiPAP HEENT: MM pink/dry, crusting to lips, BiPAP mask in place Neuro: Awakens, alert, speech clear but difficult to understand with mask in place, generalized weakness  CV: s1s2 rrr, no m/r/g PULM: even/non-labored, lungs bilaterally diminished L>R WG:NFAO, non-tender, bsx4 active  Extremities: warm/dry, 3+ pitting edema up to upper abdomen  Skin: no rashes or lesions  Assessment & Plan:   Large Left Empyema. P: Follow pleural studies  Monitor CXR  May need to consider tPA + pulmozyme for emypma  GPC Bacteremia P: Discontinue zosyn Continue vancomycin  Follow cultures   Acute hypoxic respiratory failure -in setting of empyema, GPC bacteremia.  DVT negative.  Unable to complete CT due to AKI.  Hx RLE DVT. P: Doubt PE with neg LE dopper, discontinue heparin.  Wean O2 for sats > 90% PRN BiPAP for increased work of breathing   Hypotension -suspect septic superimposed on cardiac component.  Doubt obstructive. DNR noted.  P: ICU monitoring  Continue vasopressors > switch to levophed, wean neo to off Add stress dose steroids  LR bolus x1 now, albumin Q6 x2 doses   dCHF  -BNP not impressive, echo from June 2019 unable to evaluate for LV diastolic function) vs ? cardiorenal syndrome. P:  Await ECHO  Chronic  pericardial thickening  -? Constrictive pericarditis. P: Await ECHO  Cardiology following, appreciate input   Hypervolemic hyponatremia. AoCKD - oliguric despite 80mg  lasix q12hrs. P: Hold further lasix Trend BMP / urinary output Replace electrolytes as indicated Avoid nephrotoxic agents, ensure adequate renal perfusion May need Nephrology input but sister has refused central line > ? If she would want HD  Hypocalcemia. P: Monitor, replace as  indicated   Anasarca. P: Albumin as above  LE Swelling -DVT negative, suspect in setting of CHF with swelling up to abdominal wall  P: Await VQ, too unstable to go at this point  Hypoglycemia. P: D10 at 4450ml/hr  Follow CBG's closely.   Best Practice:  Diet: NPO while on BiPAP Pain/Anxiety/Delirium protocol (if indicated): N/A. VAP protocol (if indicated): N/A. DVT prophylaxis: Heparin sq GI prophylaxis: N/A. Glucose control: N/A. Mobility: Bedrest for now. Code Status: DNR. Family Communication: Sister Ivor Messier(Cora) updated via phone 11/26.  She understands that he is critically ill and may not survive this illness.  We discussed the concept of HD and she feels that given the overwhelming infection, HD would not likely be of benefit. Will continue supportive care but if declines will need to discuss transition to comfort focus with sister.  She is aware he may need this type of care.    Disposition: ICU.  Labs   CBC: Recent Labs  Lab Aug 16, 2018 1014 Aug 16, 2018 1041 11/22/18 0014 10/30/2018 0252  WBC 23.8*  --  24.9* 22.4*  NEUTROABS 21.4*  --   --   --   HGB 11.7* 13.9 11.4* 10.7*  HCT 39.9 41.0 39.6 38.6*  MCV 65.7*  --  65.0* 68.2*  PLT 208  --  242 269   Basic Metabolic Panel: Recent Labs  Lab Aug 16, 2018 1014 Aug 16, 2018 1041 11/22/18 0014 11/25/2018 0252  NA 132* 130* 130* 131*  K 4.6 4.6 4.6 5.2*  CL 91* 93* 90* 93*  CO2 28  --  28 25  GLUCOSE 88 83 68* 63*  BUN 73* 69* 75* 87*  CREATININE 2.56* 2.80* 3.03* 4.14*  CALCIUM 7.9*  --  7.6* 6.8*   GFR: Estimated Creatinine Clearance: 28.1 mL/min (A) (by C-G formula based on SCr of 4.14 mg/dL (H)). Recent Labs  Lab Aug 16, 2018 1014 Aug 16, 2018 1039 11/22/18 0014 11/22/18 1058 11/06/2018 0252  PROCALCITON  --   --   --  85.25  --   WBC 23.8*  --  24.9*  --  22.4*  LATICACIDVEN  --  2.13*  --  3.5*  --    Liver Function Tests: Recent Labs  Lab Aug 16, 2018 1014  AST 34  ALT 19  ALKPHOS 114  BILITOT 0.9  PROT 5.6*    ALBUMIN 1.7*   No results for input(s): LIPASE, AMYLASE in the last 168 hours. No results for input(s): AMMONIA in the last 168 hours. ABG    Component Value Date/Time   PHART 7.230 (L) 11/22/2018 1152   PCO2ART 67.2 (HH) 11/22/2018 1152   PO2ART 101.0 11/22/2018 1152   HCO3 28.1 (H) 11/22/2018 1152   TCO2 30 11/22/2018 1152   ACIDBASEDEF 1.0 11/22/2018 1152   O2SAT 96.0 11/22/2018 1152    Coagulation Profile: No results for input(s): INR, PROTIME in the last 168 hours.   Cardiac Enzymes: Recent Labs  Lab Aug 16, 2018 1014  TROPONINI <0.03   HbA1C: No results found for: HGBA1C   CBG: Recent Labs  Lab 11/22/18 2312 11/22/18 2336 11/15/2018 0327 11/24/2018 0444 11/02/2018 0525  GLUCAP 59* 87 38* 58*  108*   CC Time: 35 mintues  Canary Brim, NP-C [facility] Pgr: [phone] or if no answer [phone] 12-19-2018, 8:15 AM  Attending Note:  57 year old male with history of liver failure, renal failure and respiratory failure who is maxed on levophed and neo and is unconscious on the BiPAP with diffuse crackles on exam. I reviewed CXR myself, low lung volumes noted.  Discussed with PCCM-NP.  I spoke with the sister who is bedside.  After discussion, decision was made to proceed with full comfort care.  Will place the withdraw orderset in then d/c neo, levo and BiPAP.  Emphasize comfort at this point.    The patient is critically ill with multiple organ systems failure and requires high complexity decision making for assessment and support, frequent evaluation and titration of therapies, application of advanced monitoring technologies and extensive interpretation of multiple databases.   Critical Care Time devoted to patient care services described in this note is  32  Minutes. This time reflects time of care of this signee Dr Koren Bound. This critical care time does not reflect procedure time, or teaching time or supervisory time of PA/NP/Med student/Med  Resident etc but could involve care discussion time.  Alyson Reedy, M.D. [facility] Pulmonary/Critical Care Medicine. Pager: [phone]. After hours pager: [phone].

## 2018-11-28 NOTE — Progress Notes (Signed)
Wasted 95 ml of morphine with Haleigh D RN down sink

## 2018-11-28 NOTE — Progress Notes (Signed)
eLink Physician-Brief Progress Note Patient Name: Justin JeffersonWilliam L [name] DOB: [birth year] MRN: [id number]   Date of Service  03/10/2018  HPI/Events of Note  Hypoglycemia - Blood glucose = 38. Given 1 amp D50 --> blood glucose 58. Given another 1 amp D50.  eICU Interventions  Will order: 1. Increase D10W IV infusion to 50 mL/hour.      Intervention Category Major Interventions: Other:  Sommer,Steven Dennard Nipugene 03/10/2018, 5:02 AM

## 2018-11-28 NOTE — Progress Notes (Signed)
   2018-04-05 1000  Clinical Encounter Type  Visited With Patient and family together  Visit Type Initial  Referral From Nurse  Consult/Referral To Chaplain  Spiritual Encounters  Spiritual Needs Prayer;Emotional  Stress Factors  Patient Stress Factors None identified  Family Stress Factors None identified   Responded to page for a consult. Called Nurse, she stated family was needing spiritual care. Pt was not alert and as I was offering some words of encouragement, family came in. Family was very thankful for the Chaplain visit. I offered prayer, a listening ear, and ministry of presence. Chaplain available as needed.   Chaplain Orest DikesAbel Alynna Hargrove [phone]

## 2018-11-28 NOTE — Progress Notes (Signed)
PHARMACY - PHYSICIAN COMMUNICATION CRITICAL VALUE ALERT - BLOOD CULTURE IDENTIFICATION (BCID)  Justin [name] is an 57 y.o. male who [facility] with a chief complaint of shortness of breath, chest pain, leg swelling  Name of physician (or Provider) [facility]ed: [id number]  Current antibiotics: Vancomycin/Zosyn  Changes to prescribed antibiotics recommended:  No changes for now  Results for orders placed or performed during the hospital encounter of 11/14/2018  Blood Culture ID Panel (Reflexed) (Collected: 11/22/2018 10:50 AM)  Result Value Ref Range   Enterococcus species NOT DETECTED NOT DETECTED   Listeria monocytogenes NOT DETECTED NOT DETECTED   Staphylococcus species DETECTED (A) NOT DETECTED   Staphylococcus aureus (BCID) DETECTED (A) NOT DETECTED   Methicillin resistance DETECTED (A) NOT DETECTED   Streptococcus species NOT DETECTED NOT DETECTED   Streptococcus agalactiae NOT DETECTED NOT DETECTED   Streptococcus pneumoniae NOT DETECTED NOT DETECTED   Streptococcus pyogenes NOT DETECTED NOT DETECTED   Acinetobacter baumannii NOT DETECTED NOT DETECTED   Enterobacteriaceae species NOT DETECTED NOT DETECTED   Enterobacter cloacae complex NOT DETECTED NOT DETECTED   Escherichia coli NOT DETECTED NOT DETECTED   Klebsiella oxytoca NOT DETECTED NOT DETECTED   Klebsiella pneumoniae NOT DETECTED NOT DETECTED   Proteus species NOT DETECTED NOT DETECTED   Serratia marcescens NOT DETECTED NOT DETECTED   Haemophilus influenzae NOT DETECTED NOT DETECTED   Neisseria meningitidis NOT DETECTED NOT DETECTED   Pseudomonas aeruginosa NOT DETECTED NOT DETECTED   Candida albicans NOT DETECTED NOT DETECTED   Candida glabrata NOT DETECTED NOT DETECTED   Candida krusei NOT DETECTED NOT DETECTED   Candida parapsilosis NOT DETECTED NOT DETECTED   Candida tropicalis NOT DETECTED NOT DETECTED    Abran DukeLedford, Allissa Albright Sep 04, 2018  4:39 AM

## 2018-11-28 NOTE — Progress Notes (Signed)
Bipap removed for comfort, pt became asystole.  Listened for 2 min with Haleigh D, RN, absent breath sounds, pupils fixed and dilated. Sister and MD informed of TOD 12:38.

## 2018-11-28 NOTE — Progress Notes (Signed)
RT NOTES: Bipap removed by RN per comfort care orders.

## 2018-11-28 DEATH — deceased

## 2018-12-01 ENCOUNTER — Telehealth: Payer: Self-pay

## 2018-12-01 NOTE — Telephone Encounter (Signed)
On 12/01/18 I received a dc from [facility] (original). DC is for burial. Patient is a patient of Doctor Molli KnockYacoub.  DC will be taken to [phone] for signature.  On 12/03/18 I received the dc back from Doctor Icard who signed the dc for Doctor Molli KnockYacoub. I got the dc ready and called the funeral home to let them know the dc is ready for pickup.

## 2018-12-29 NOTE — Death Summary Note (Signed)
DEATH SUMMARY   Patient Details  Name: Justin [name] MRN: [id number] DOB: [birth year]  Admission/Discharge Information   Admit Date:  25-Nov-2018  Date of Death: Date of Death: 11/25/2018  Time of Death: Time of Death: 1238  Length of Stay: 2  Referring Physician: Juluis RainierBarnes, Elizabeth, MD   Reason(s) for Hospitalization  Circulatory shock  Diagnoses  Preliminary cause of death:   Acute hypoxemic respiratory failure  Secondary Diagnoses (including complications and co-morbidities):  Principal Problem:   Sepsis (HCC) Active Problems:   CKD (chronic kidney disease) stage 3, GFR 30-59 ml/min (HCC)   (HFpEF) heart failure with preserved ejection fraction- Acute CHF Exacerbation   Hypotension   Hypoglycemia   Pleural effusion, left   Constrictive pericarditis   Acute respiratory failure with hypoxia (HCC)   Dyspnea   Pleural effusion on left   Empyema [facility](HCC)   MRSA bacteremia   Brief Hospital Course (including significant findings, care, treatment, and services provided and events leading to death)  58 year old male with history of liver failure, renal failure and respiratory failure who is maxed on levophed and neo and is unconscious on the BiPAP with diffuse crackles on exam. Patient is deteriorating and showing no signs of improvement.  I do not believe there is any reasonable chance of improvement.  I spoke with the sister who is bedside.  After discussion, decision was made to proceed with full comfort care.  Will place the withdraw orderset in then d/c neo, levo and BiPAP.  Emphasize comfort at this point.   Morphine was started and pressors were stopped for patient to expire shortly thereafter with the family bedside.  Pertinent Labs and Studies  Significant Diagnostic Studies Ct Abdomen Pelvis Wo Contrast  Result Date: 25-Nov-2018 CLINICAL DATA:  58 year old male with history of chest pain, right-sided flank pain and generalized weakness since yesterday. EXAM: CT CHEST,  ABDOMEN AND PELVIS WITHOUT CONTRAST TECHNIQUE: Multidetector CT imaging of the chest, abdomen and pelvis was performed following the standard protocol without IV contrast. COMPARISON:  CT the chest 04/05/2008. CT the abdomen and pelvis 04/03/2008. FINDINGS: CT CHEST FINDINGS Cardiovascular: Heart size is normal. Pericardial thickening and trace amount of pericardial fluid with pericardial calcification. Narrowing of the cardiac contours, suggesting potential constrictive physiology. There is aortic atherosclerosis, as well as atherosclerosis of the great vessels of the mediastinum and the coronary arteries, including calcified atherosclerotic plaque in the left anterior descending and right coronary arteries. Mediastinum/Nodes: No pathologically enlarged mediastinal or hilar lymph nodes. Please note that accurate exclusion of hilar adenopathy is limited on noncontrast CT scans. Esophagus is unremarkable in appearance. No axillary lymphadenopathy. Lungs/Pleura: Large left pleural effusion with near complete passive atelectasis throughout the left lung. Minimal residual aerated left upper lobe and superior segment of the left lower lobe. Right lung is well aerated. No right-sided pleural effusion. No definite suspicious appearing pulmonary nodules or masses are noted. Musculoskeletal: There are no aggressive appearing lytic or blastic lesions noted in the visualized portions of the skeleton. CT ABDOMEN PELVIS FINDINGS Hepatobiliary: No definite suspicious cystic or solid hepatic lesions are confidently identified on today's noncontrast CT examination. Mild diffuse low attenuation throughout the hepatic parenchyma, indicative of hepatic steatosis. Liver also has a shrunken appearance and nodular contour, indicative of underlying cirrhosis. Gallbladder is poorly demonstrated. Pancreas: No definite pancreatic mass or peripancreatic fluid or inflammatory changes are noted on today's noncontrast CT examination. Spleen:  Unremarkable. Adrenals/Urinary Tract: There are no abnormal calcifications within the collecting system of either  kidney, along the course of either ureter, or within the lumen of the urinary bladder. No hydroureteronephrosis or perinephric stranding to suggest urinary tract obstruction at this time. The unenhanced appearance of the kidneys is unremarkable bilaterally. Unenhanced appearance of the urinary bladder is normal. Bilateral adrenal glands are normal in appearance. Stomach/Bowel: Normal appearance of the stomach. No pathologic dilatation of small bowel or colon. A few scattered colonic diverticulae are noted, without definite surrounding inflammatory changes to strongly suggest an acute diverticulitis at this time (assessment is limited by the presence of a small volume of ascites). Normal appendix. Vascular/Lymphatic: No atherosclerotic calcifications noted in the abdominal or pelvic vasculature. Multiple prominent borderline enlarged and mildly enlarged pelvic lymph nodes measuring up 2 1.3 cm in short axis in the right external iliac nodal station, nonspecific and favored to be reactive. Reproductive: Prostate gland and seminal vesicles are unremarkable in appearance. Other: Small volume of ascites. No pneumoperitoneum. Diffuse body wall edema. Musculoskeletal: There are no aggressive appearing lytic or blastic lesions noted in the visualized portions of the skeleton. IMPRESSION: 1. Large left pleural effusion with near complete passive atelectasis throughout the left lung. 2. Chronic pericardial thickening and calcification with morphologic changes in the heart suggestive of constrictive physiology. Correlation with echocardiography is strongly recommended if not already performed. 3. Morphologic changes in the liver compatible with cirrhosis. There is also evidence of hepatic steatosis. 4. Small volume of ascites. 5. Diffuse body wall edema. 6. Mild colonic diverticulosis. No definite findings to  strongly suggest an acute diverticulitis at this time (assessment is slightly limited by the presence of ascites). 7. Additional incidental findings, as above. Electronically Signed   By: Trudie Reed M.D.   On: 11/27/2018 15:06   Ct Chest Wo Contrast  Result Date: 10/30/2018 CLINICAL DATA:  58 year old male with history of chest pain, right-sided flank pain and generalized weakness since yesterday. EXAM: CT CHEST, ABDOMEN AND PELVIS WITHOUT CONTRAST TECHNIQUE: Multidetector CT imaging of the chest, abdomen and pelvis was performed following the standard protocol without IV contrast. COMPARISON:  CT the chest 04/05/2008. CT the abdomen and pelvis 04/03/2008. FINDINGS: CT CHEST FINDINGS Cardiovascular: Heart size is normal. Pericardial thickening and trace amount of pericardial fluid with pericardial calcification. Narrowing of the cardiac contours, suggesting potential constrictive physiology. There is aortic atherosclerosis, as well as atherosclerosis of the great vessels of the mediastinum and the coronary arteries, including calcified atherosclerotic plaque in the left anterior descending and right coronary arteries. Mediastinum/Nodes: No pathologically enlarged mediastinal or hilar lymph nodes. Please note that accurate exclusion of hilar adenopathy is limited on noncontrast CT scans. Esophagus is unremarkable in appearance. No axillary lymphadenopathy. Lungs/Pleura: Large left pleural effusion with near complete passive atelectasis throughout the left lung. Minimal residual aerated left upper lobe and superior segment of the left lower lobe. Right lung is well aerated. No right-sided pleural effusion. No definite suspicious appearing pulmonary nodules or masses are noted. Musculoskeletal: There are no aggressive appearing lytic or blastic lesions noted in the visualized portions of the skeleton. CT ABDOMEN PELVIS FINDINGS Hepatobiliary: No definite suspicious cystic or solid hepatic lesions are  confidently identified on today's noncontrast CT examination. Mild diffuse low attenuation throughout the hepatic parenchyma, indicative of hepatic steatosis. Liver also has a shrunken appearance and nodular contour, indicative of underlying cirrhosis. Gallbladder is poorly demonstrated. Pancreas: No definite pancreatic mass or peripancreatic fluid or inflammatory changes are noted on today's noncontrast CT examination. Spleen: Unremarkable. Adrenals/Urinary Tract: There are no abnormal calcifications within the collecting  system of either kidney, along the course of either ureter, or within the lumen of the urinary bladder. No hydroureteronephrosis or perinephric stranding to suggest urinary tract obstruction at this time. The unenhanced appearance of the kidneys is unremarkable bilaterally. Unenhanced appearance of the urinary bladder is normal. Bilateral adrenal glands are normal in appearance. Stomach/Bowel: Normal appearance of the stomach. No pathologic dilatation of small bowel or colon. A few scattered colonic diverticulae are noted, without definite surrounding inflammatory changes to strongly suggest an acute diverticulitis at this time (assessment is limited by the presence of a small volume of ascites). Normal appendix. Vascular/Lymphatic: No atherosclerotic calcifications noted in the abdominal or pelvic vasculature. Multiple prominent borderline enlarged and mildly enlarged pelvic lymph nodes measuring up 2 1.3 cm in short axis in the right external iliac nodal station, nonspecific and favored to be reactive. Reproductive: Prostate gland and seminal vesicles are unremarkable in appearance. Other: Small volume of ascites. No pneumoperitoneum. Diffuse body wall edema. Musculoskeletal: There are no aggressive appearing lytic or blastic lesions noted in the visualized portions of the skeleton. IMPRESSION: 1. Large left pleural effusion with near complete passive atelectasis throughout the left lung. 2.  Chronic pericardial thickening and calcification with morphologic changes in the heart suggestive of constrictive physiology. Correlation with echocardiography is strongly recommended if not already performed. 3. Morphologic changes in the liver compatible with cirrhosis. There is also evidence of hepatic steatosis. 4. Small volume of ascites. 5. Diffuse body wall edema. 6. Mild colonic diverticulosis. No definite findings to strongly suggest an acute diverticulitis at this time (assessment is slightly limited by the presence of ascites). 7. Additional incidental findings, as above. Electronically Signed   By: Trudie Reed M.D.   On: December 17, 2018 15:06   Dg Chest Port 1 View  Result Date: 11/22/2018 CLINICAL DATA:  Status post left chest tube placement EXAM: PORTABLE CHEST 1 VIEW COMPARISON:  11/22/2018 FINDINGS: Cardiac shadow remains enlarged. Large left pleural effusion is again identified and stable from the previous day. Stable chest tube is noted in the left lung base. Underlying consolidation of the left lung is present as well. Mild right basilar atelectasis is seen. IMPRESSION: Stable left pleural effusion with chest tube in place. Mild right basilar atelectasis is noted. Electronically Signed   By: Alcide Clever M.D.   On: 11/22/2018 07:19   Dg Chest Port 1 View  Result Date: 11/22/2018 CLINICAL DATA:  Chest tube placement. EXAM: PORTABLE CHEST 1 VIEW COMPARISON:  One-view chest x-ray 11/22/2018 FINDINGS: A left-sided chest tube is in place. Extends along the inferior aspect of the pleural surface. There is slight decrease in the left pleural effusion. No pneumothorax is present. Moderate edema remains. IMPRESSION: 1. Slight decrease in left pleural effusion following placement of chest tube. 2. The chest tube is positioned at the inferior aspect pleural space. 3. Moderate edema. 4. Cardiac shadow is obscured by the fusion. Electronically Signed   By: Marin Roberts M.D.   On: 11/22/2018  20:00   Dg Chest Port 1 View  Result Date: 11/22/2018 CLINICAL DATA:  58 year old male with a history of left-sided thoracentesis EXAM: PORTABLE CHEST 1 VIEW COMPARISON:  CT Dec 17, 2018, chest x-ray 12-17-2018 FINDINGS: Cardiomediastinal silhouette likely unchanged though partially obscured by overlying lung/pleural disease. Low lung volumes. Dense opacity of the left chest opacifies the left hemidiaphragm in the left heart border. The a past the is relatively unchanged from the comparison plain film. No pneumothorax observed. Interlobular septal thickening bilateral lungs with patchy opacities of  the right lung. IMPRESSION: Similar appearance of left chest opacity, compatible with persisting/recurrent pleural fluid and associated atelectasis/consolidation. Low lung volumes with pulmonary edema. Electronically Signed   By: Gilmer Mor D.O.   On: 11/22/2018 14:54   Dg Chest Port 1 View  Result Date: 12/12/2018 CLINICAL DATA:  Chest pain, RIGHT flank pain and generalized weakness since yesterday EXAM: PORTABLE CHEST 1 VIEW COMPARISON:  Portable exam 1024 hours compared to 06/17/2018 FINDINGS: Enlargement of cardiac silhouette. Pulmonary vascular congestion. Large LEFT pleural effusion with significant opacification of the inferior 2/3 of the LEFT lung. Mild central peribronchial thickening and RIGHT basilar atelectasis. No pneumothorax or acute osseous findings. IMPRESSION: Large LEFT pleural effusion with significant LEFT lung atelectasis; underlying mass and infiltrate not excluded in this setting. Bronchitic changes with minimal RIGHT basilar atelectasis. Enlargement of cardiac silhouette. Electronically Signed   By: Ulyses Southward M.D.   On: 12-Dec-2018 10:41   Vas [place] Lower Extremity Venous (dvt)  Result Date: 11/22/2018  Lower Venous Study Indications: Swelling, Edema, and Elevated Ddimer.  Limitations: Body habitus and poor ultrasound/tissue interface. Performing Technologist: Chanda Busing RVT   Examination Guidelines: A complete evaluation includes B-mode imaging, spectral Doppler, color Doppler, and power Doppler as needed of all accessible portions of each vessel. Bilateral testing is considered an integral part of a complete examination. Limited examinations for reoccurring indications may be performed as noted.  Right Venous Findings: +---------+---------------+---------+-----------+----------+-------+          CompressibilityPhasicitySpontaneityPropertiesSummary +---------+---------------+---------+-----------+----------+-------+ CFV      Full           Yes      Yes                          +---------+---------------+---------+-----------+----------+-------+ SFJ      Full                                                 +---------+---------------+---------+-----------+----------+-------+ FV Prox  Full                                                 +---------+---------------+---------+-----------+----------+-------+ FV Mid                  Yes      Yes                          +---------+---------------+---------+-----------+----------+-------+ FV DistalFull                                                 +---------+---------------+---------+-----------+----------+-------+ PFV      Full                                                 +---------+---------------+---------+-----------+----------+-------+ POP      Full           Yes      Yes                          +---------+---------------+---------+-----------+----------+-------+  PTV      Full                                                 +---------+---------------+---------+-----------+----------+-------+ PERO     Full                                                 +---------+---------------+---------+-----------+----------+-------+  Left Venous Findings: +---------+---------------+---------+-----------+----------+-------+           CompressibilityPhasicitySpontaneityPropertiesSummary +---------+---------------+---------+-----------+----------+-------+ CFV      Full           Yes      Yes                          +---------+---------------+---------+-----------+----------+-------+ SFJ      Full                                                 +---------+---------------+---------+-----------+----------+-------+ FV Prox  Full                                                 +---------+---------------+---------+-----------+----------+-------+ FV Mid                  Yes      Yes                          +---------+---------------+---------+-----------+----------+-------+ FV Distal               Yes      Yes                          +---------+---------------+---------+-----------+----------+-------+ PFV      Full           Yes      Yes                          +---------+---------------+---------+-----------+----------+-------+ POP      Full                                                 +---------+---------------+---------+-----------+----------+-------+ PTV      Full                                                 +---------+---------------+---------+-----------+----------+-------+    Summary: Right: There is no evidence of deep vein thrombosis in the lower extremity. However, portions of this examination were limited- see technologist comments above. No cystic structure found in the popliteal fossa. Left: There is no evidence of deep vein thrombosis in the lower extremity. However,  portions of this examination were limited- see technologist comments above. No cystic structure found in the popliteal fossa.  *See table(s) above for measurements and observations. Electronically signed by Waverly Ferrari MD on 11/22/2018 at 4:15:58 PM.    Final     Microbiology Recent Results (from the past 240 hour(s))  MRSA PCR Screening     Status: Abnormal   Collection Time: 11/22/18  4:10  AM  Result Value Ref Range Status   MRSA by PCR POSITIVE (A) NEGATIVE Final    Comment:        The GeneXpert MRSA Assay (FDA approved for NASAL specimens only), is one component of a comprehensive MRSA colonization surveillance program. It is not intended to diagnose MRSA infection nor to guide or monitor treatment for MRSA infections. RESULT CALLED TO, READ BACK BY AND VERIFIED WITH: Ulyses Southward RN 9:55 11/22/18 ([email or web address]) Performed at [facility], [address] [place], [place] [address]   Culture, blood (routine x 2)     Status: Abnormal   Collection Time: 11/22/18 10:50 AM  Result Value Ref Range Status   Specimen Description BLOOD RIGHT ANTECUBITAL  Final   Special Requests   Final    BOTTLES DRAWN AEROBIC AND ANAEROBIC Blood Culture adequate volume   Culture  Setup Time   Final    GRAM POSITIVE COCCI IN BOTH AEROBIC AND ANAEROBIC BOTTLES Organism ID to follow CRITICAL RESULT CALLED TO, READ BACK BY AND VERIFIED WITHMelven [facility] [id number] 11/01/2018 A BROWNING Performed at [facility], [address] [place], [place] [address]    Culture METHICILLIN RESISTANT STAPHYLOCOCCUS AUREUS (A)  Final   Report Status 11/25/2018 FINAL  Final   Organism ID, Bacteria METHICILLIN RESISTANT STAPHYLOCOCCUS AUREUS  Final      Susceptibility   Methicillin resistant staphylococcus aureus - MIC*    CIPROFLOXACIN >=8 RESISTANT Resistant     ERYTHROMYCIN >=8 RESISTANT Resistant     GENTAMICIN <=0.5 SENSITIVE Sensitive     OXACILLIN >=4 RESISTANT Resistant     TETRACYCLINE >=16 RESISTANT Resistant     VANCOMYCIN <=0.5 SENSITIVE Sensitive     TRIMETH/SULFA <=10 SENSITIVE Sensitive     CLINDAMYCIN >=8 RESISTANT Resistant     RIFAMPIN <=0.5 SENSITIVE Sensitive     Inducible Clindamycin NEGATIVE Sensitive     * METHICILLIN RESISTANT STAPHYLOCOCCUS AUREUS  Blood Culture ID Panel (Reflexed)     Status: Abnormal   Collection Time: 11/22/18 10:50 AM  Result Value Ref Range Status    Enterococcus species NOT DETECTED NOT DETECTED Final   Listeria monocytogenes NOT DETECTED NOT DETECTED Final   Staphylococcus species DETECTED (A) NOT DETECTED Final    Comment: CRITICAL RESULT CALLED TO, READ BACK BY AND VERIFIED WITHShela Commons [facility] PHARMD [id number] 11/05/2018 A BROWNING    Staphylococcus aureus (BCID) DETECTED (A) NOT DETECTED Final    Comment: Methicillin (oxacillin)-resistant Staphylococcus aureus (MRSA). MRSA is predictably resistant to beta-lactam antibiotics (except ceftaroline). Preferred therapy is vancomycin unless clinically contraindicated. Patient requires contact precautions if  hospitalized. CRITICAL RESULT CALLED TO, READ BACK BY AND VERIFIED WITHShela Commons [facility] PHARMD [id number] 11/11/2018 A BROWNING    Methicillin resistance DETECTED (A) NOT DETECTED Final    Comment: CRITICAL RESULT CALLED TO, READ BACK BY AND VERIFIED WITHShela Commons [facility] PHARMD [id number] 11/06/2018 A BROWNING    Streptococcus species NOT DETECTED NOT DETECTED Final   Streptococcus agalactiae NOT DETECTED NOT DETECTED Final   Streptococcus pneumoniae NOT DETECTED NOT DETECTED Final   Streptococcus  pyogenes NOT DETECTED NOT DETECTED Final   Acinetobacter baumannii NOT DETECTED NOT DETECTED Final   Enterobacteriaceae species NOT DETECTED NOT DETECTED Final   Enterobacter cloacae complex NOT DETECTED NOT DETECTED Final   Escherichia coli NOT DETECTED NOT DETECTED Final   Klebsiella oxytoca NOT DETECTED NOT DETECTED Final   Klebsiella pneumoniae NOT DETECTED NOT DETECTED Final   Proteus species NOT DETECTED NOT DETECTED Final   Serratia marcescens NOT DETECTED NOT DETECTED Final   Haemophilus influenzae NOT DETECTED NOT DETECTED Final   Neisseria meningitidis NOT DETECTED NOT DETECTED Final   Pseudomonas aeruginosa NOT DETECTED NOT DETECTED Final   Candida albicans NOT DETECTED NOT DETECTED Final   Candida glabrata NOT DETECTED NOT DETECTED Final   Candida krusei NOT DETECTED NOT DETECTED Final   Candida  parapsilosis NOT DETECTED NOT DETECTED Final   Candida tropicalis NOT DETECTED NOT DETECTED Final    Comment: Performed at [facility], [address], [place], [place] [address]  Culture, blood (routine x 2)     Status: Abnormal   Collection Time: 11/22/18 10:55 AM  Result Value Ref Range Status   Specimen Description BLOOD RIGHT HAND  Final   Special Requests   Final    BOTTLES DRAWN AEROBIC AND ANAEROBIC Blood Culture results may not be optimal due to an inadequate volume of blood received in culture bottles   Culture  Setup Time   Final    GRAM POSITIVE COCCI IN BOTH AEROBIC AND ANAEROBIC BOTTLES CRITICAL RESULT CALLED TO, READ BACK BY AND VERIFIED WITHShela Commons [facility] PHARMD [id number] December 22, 2018 A BROWNING    Culture (A)  Final    STAPHYLOCOCCUS AUREUS SUSCEPTIBILITIES PERFORMED ON PREVIOUS CULTURE WITHIN THE LAST 5 DAYS. Performed at [facility], [address] [place], [place] [address]    Report Status 11/25/2018 FINAL  Final  Body fluid culture     Status: None   Collection Time: 11/22/18  3:09 PM  Result Value Ref Range Status   Specimen Description PLEURAL LEFT  Final   Special Requests   Final    NONE Performed at [facility], [address], [place], [place] [address]    Gram Stain   Final    WBC PRESENT, PREDOMINANTLY PMN GRAM POSITIVE COCCI CYTOSPIN SMEAR    Culture FEW METHICILLIN RESISTANT STAPHYLOCOCCUS AUREUS  Final   Report Status 11/24/2018 FINAL  Final   Organism ID, Bacteria METHICILLIN RESISTANT STAPHYLOCOCCUS AUREUS  Final      Susceptibility   Methicillin resistant staphylococcus aureus - MIC*    CIPROFLOXACIN 4 RESISTANT Resistant     ERYTHROMYCIN >=8 RESISTANT Resistant     GENTAMICIN <=0.5 SENSITIVE Sensitive     OXACILLIN >=4 RESISTANT Resistant     TETRACYCLINE >=16 RESISTANT Resistant     VANCOMYCIN 1 SENSITIVE Sensitive     TRIMETH/SULFA <=10 SENSITIVE Sensitive     CLINDAMYCIN >=8 RESISTANT Resistant     RIFAMPIN <=0.5  SENSITIVE Sensitive     Inducible Clindamycin NEGATIVE Sensitive     * FEW METHICILLIN RESISTANT STAPHYLOCOCCUS AUREUS  Acid Fast Smear (AFB)     Status: None   Collection Time: 11/22/18  3:09 PM  Result Value Ref Range Status   AFB Specimen Processing Concentration  Final   Acid Fast Smear Negative  Final    Comment: (NOTE) Performed At: [facility] [address] [place], [place] [id number] Jolene Schimke MD QM:[id number]    Source (AFB) PLEURAL  Final  Comment: LEFT    Lab Basic Metabolic Panel: Recent Labs  Lab 11/22/18 0014 12/22/18 0252  NA 130* 131*  K 4.6 5.2*  CL 90* 93*  CO2 28 25  GLUCOSE 68* 63*  BUN 75* 87*  CREATININE 3.03* 4.14*  CALCIUM 7.6* 6.8*   Liver Function Tests: No results for input(s): AST, ALT, ALKPHOS, BILITOT, PROT, ALBUMIN in the last 168 hours. No results for input(s): LIPASE, AMYLASE in the last 168 hours. No results for input(s): AMMONIA in the last 168 hours. CBC: Recent Labs  Lab 11/22/18 0014 2018/12/22 0252  WBC 24.9* 22.4*  HGB 11.4* 10.7*  HCT 39.6 38.6*  MCV 65.0* 68.2*  PLT 242 269   Cardiac Enzymes: No results for input(s): CKTOTAL, CKMB, CKMBINDEX, TROPONINI in the last 168 hours. Sepsis Labs: Recent Labs  Lab 11/22/18 0014 11/22/18 1058 12/22/18 0252  PROCALCITON  --  85.25  --   WBC 24.9*  --  22.4*  LATICACIDVEN  --  3.5*  --     Procedures/Operations     Rada Zegers 11/28/2018, 3:14 PM

## 2019-01-08 LAB — ACID FAST CULTURE WITH REFLEXED SENSITIVITIES (MYCOBACTERIA): Acid Fast Culture: NEGATIVE

## 2019-01-08 LAB — ACID FAST CULTURE WITH REFLEXED SENSITIVITIES

## 2019-02-04 ENCOUNTER — Ambulatory Visit: Payer: Medicaid Other | Admitting: Cardiology

## 2019-06-04 IMAGING — NM NM PULMONARY VENT & PERF
12 series · 12 of 12 positions shown · non-contrast
Comparison: Chest radiograph 06/14/2018

CLINICAL DATA: Shortness of breath. Extremity edema. CHF and
chronic kidney disease.

EXAM:
NUCLEAR MEDICINE VENTILATION - PERFUSION LUNG SCAN
TECHNIQUE: Ventilation images were obtained in multiple projections using
inhaled aerosol Qc-11m DTPA. Perfusion images were obtained in
multiple projections after intravenous injection of [id number].
RADIOPHARMACEUTICALS:  31.5 mCi of Qc-11m DTPA aerosol inhalation
and 4.3 mCi [id number] IV

[Series 1: ant/post vent · 4.14mm/px · 1 of 1 slices shown (1 of 2)]
[im 1/1]
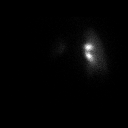

[Series 1: ant/post vent · 4.14mm/px · 1 of 1 slices shown (2 of 2)]
[im 1/1]
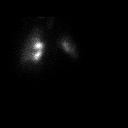

[Series 2: lao/rpo vent · 4.14mm/px · 1 of 1 slices shown (1 of 2)]
[im 1/1]
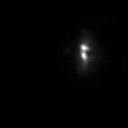

[Series 2: lao/rpo vent · 4.14mm/px · 1 of 1 slices shown (2 of 2)]
[im 1/1]
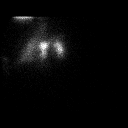

[Series 3: lpo/rao vent · 4.14mm/px · 1 of 1 slices shown (1 of 2)]
[im 1/1]
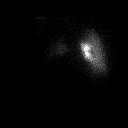

[Series 3: lpo/rao vent · 4.14mm/px · 1 of 1 slices shown (2 of 2)]
[im 1/1]
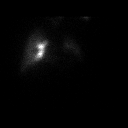

[Series 4: lpo/rao perf · 4.14mm/px · 1 of 1 slices shown (1 of 2)]
[im 1/1]
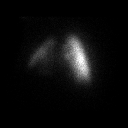

[Series 4: lpo/rao perf · 4.14mm/px · 1 of 1 slices shown (2 of 2)]
[im 1/1]
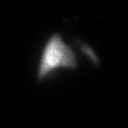

[Series 5: ant/post perf · 4.14mm/px · 1 of 1 slices shown (1 of 2)]
[im 1/1]
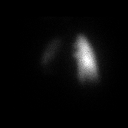

[Series 5: ant/post perf · 4.14mm/px · 1 of 1 slices shown (2 of 2)]
[im 1/1]
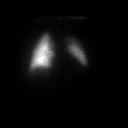

[Series 6: lao/rpo perf · 4.14mm/px · 1 of 1 slices shown (1 of 2)]
[im 1/1]
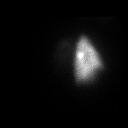

[Series 6: lao/rpo perf · 4.14mm/px · 1 of 1 slices shown (2 of 2)]
[im 1/1]
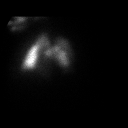

[12 of 12 positions shown; findings below may reference images not displayed]

FINDINGS: Ventilation and perfusion throughout the right lung are normal aside
from some aerosol deposition in the central airways. There is
relatively uniformly diminished radiotracer throughout the left lung
on both ventilation and perfusion images in a matching fashion with
cardiomegaly and a left pleural effusion present on the comparison
radiographs. No focal segmental perfusion defect is seen to
specifically suggest pulmonary embolus.
IMPRESSION: Low probability of pulmonary embolus.

## 2019-06-06 IMAGING — DX DG CHEST 2V
2 series · 2 of 2 positions shown · non-contrast
Comparison: 06/14/2018

CLINICAL DATA: Shortness of Breath

EXAM:
CHEST - 2 VIEW

[chest lat]
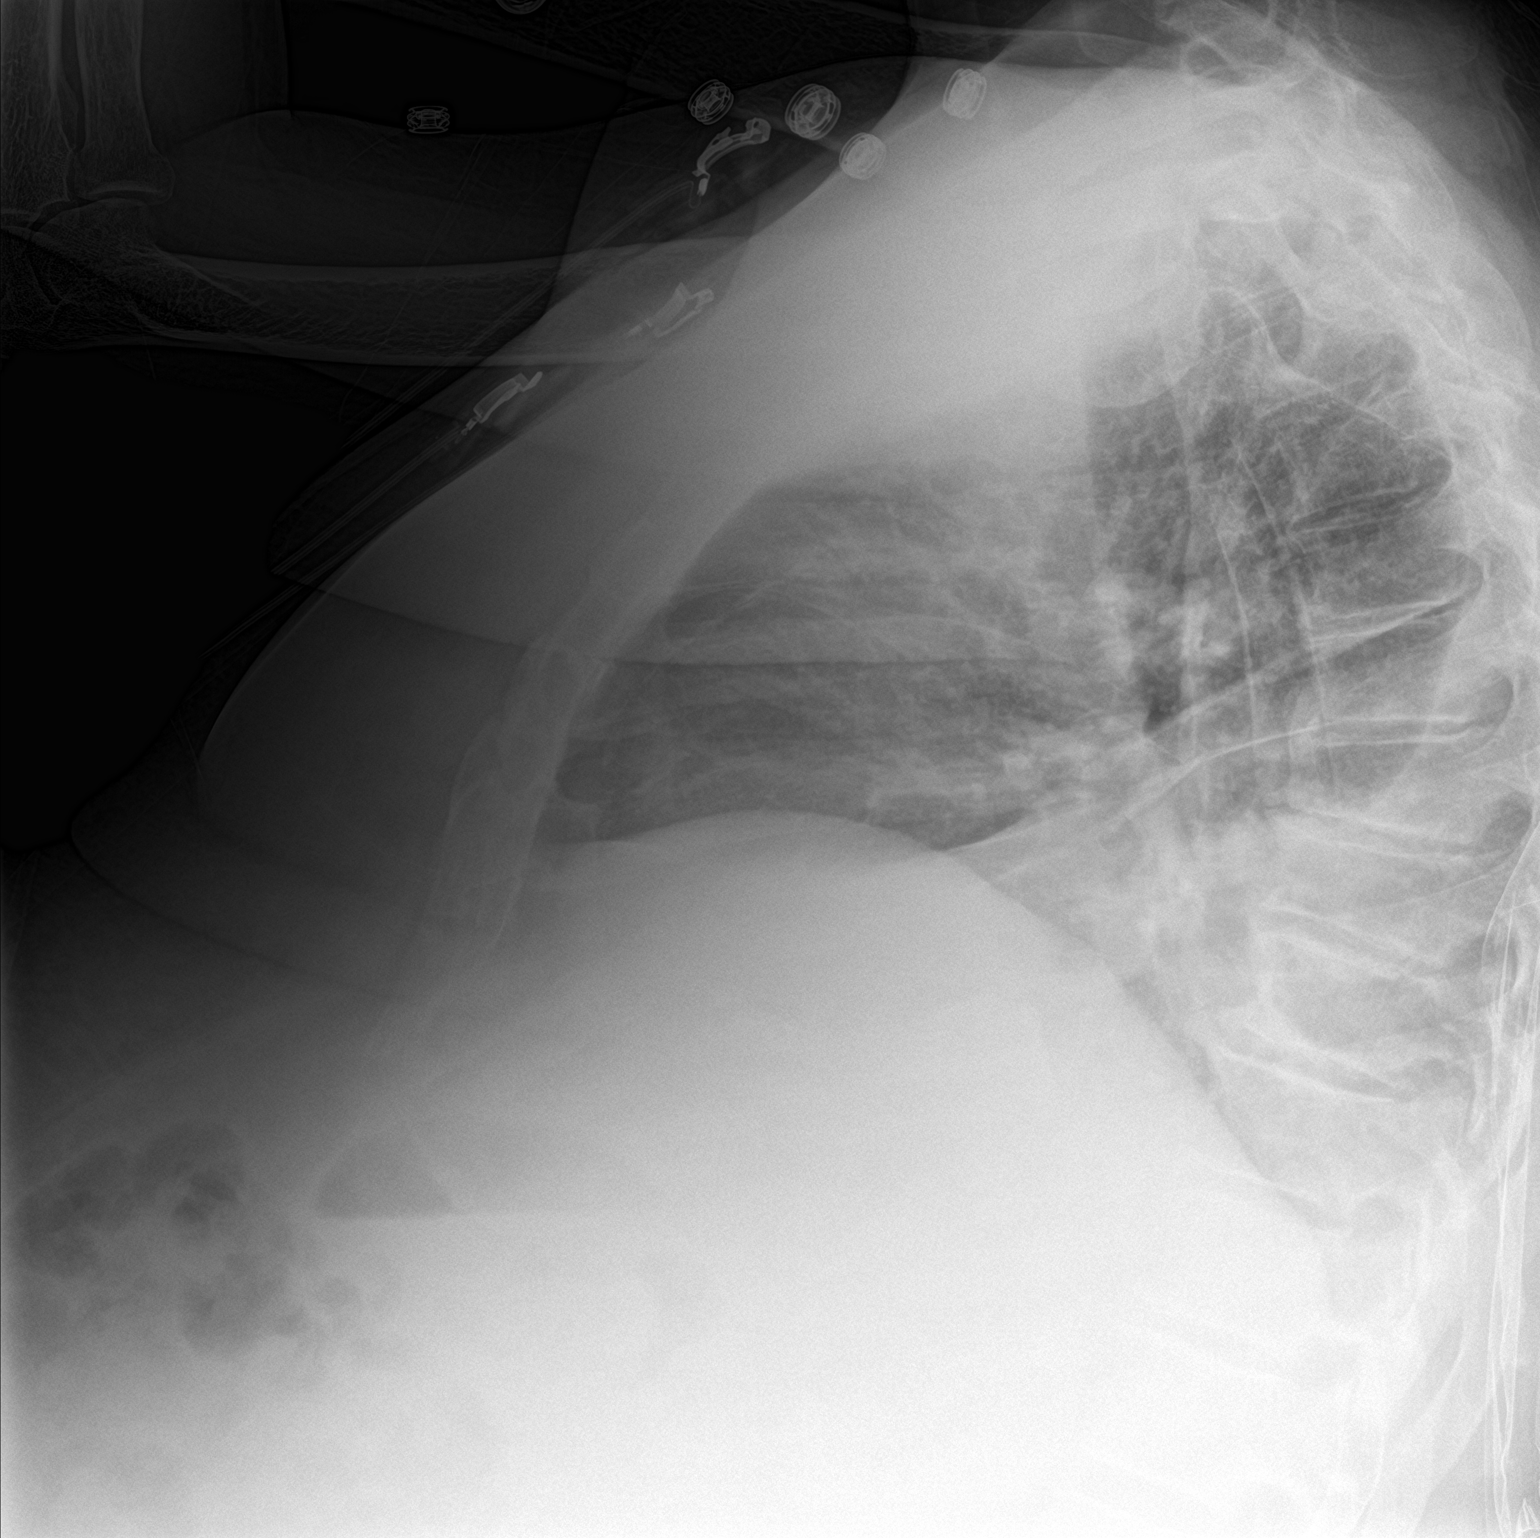

[chest ap]
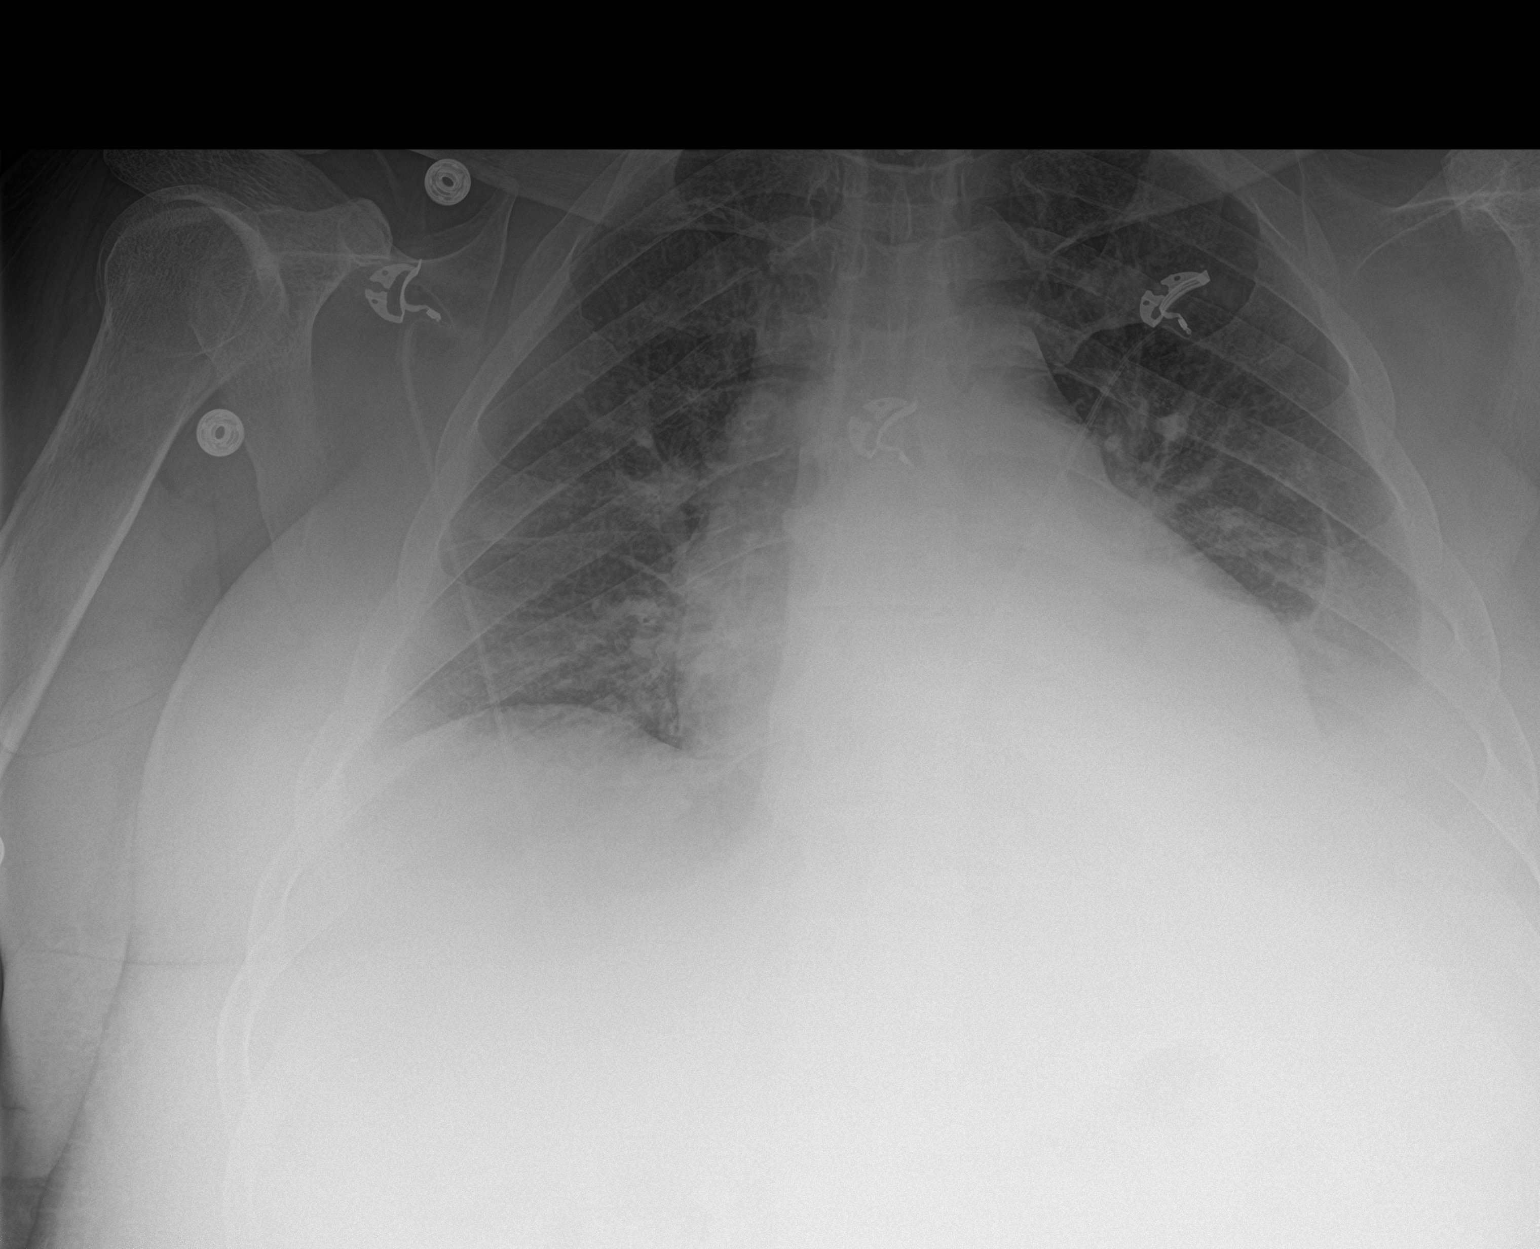

[2 of 2 positions shown; findings below may reference images not displayed]

FINDINGS: Cardiac shadow is stable. Increasing left pleural effusion is noted
when compare with the prior exam. Mild vascular congestion is noted
as well. There is likely underlying left lower lobe infiltrate. No
bony abnormality is noted.
IMPRESSION: Left basilar changes with increasing left pleural effusion.
